# Patient Record
Sex: Female | Born: 1945
Health system: Southern US, Community
[De-identification: ages and names within clinical notes are randomized; demographics above are authoritative.]

## PROBLEM LIST (undated history)

## (undated) DIAGNOSIS — E559 Vitamin D deficiency, unspecified: Secondary | ICD-10-CM

## (undated) DIAGNOSIS — H269 Unspecified cataract: Secondary | ICD-10-CM

## (undated) DIAGNOSIS — K219 Gastro-esophageal reflux disease without esophagitis: Secondary | ICD-10-CM

## (undated) DIAGNOSIS — Z9889 Other specified postprocedural states: Secondary | ICD-10-CM

## (undated) DIAGNOSIS — M3390 Dermatopolymyositis, unspecified, organ involvement unspecified: Secondary | ICD-10-CM

## (undated) DIAGNOSIS — R569 Unspecified convulsions: Secondary | ICD-10-CM

## (undated) DIAGNOSIS — R9401 Abnormal electroencephalogram [EEG]: Secondary | ICD-10-CM

## (undated) DIAGNOSIS — M3313 Other dermatomyositis without myopathy: Secondary | ICD-10-CM

## (undated) DIAGNOSIS — F32A Depression, unspecified: Secondary | ICD-10-CM

## (undated) DIAGNOSIS — N3946 Mixed incontinence: Secondary | ICD-10-CM

## (undated) DIAGNOSIS — I10 Essential (primary) hypertension: Secondary | ICD-10-CM

## (undated) DIAGNOSIS — Z8719 Personal history of other diseases of the digestive system: Secondary | ICD-10-CM

## (undated) DIAGNOSIS — R413 Other amnesia: Secondary | ICD-10-CM

## (undated) DIAGNOSIS — N950 Postmenopausal bleeding: Secondary | ICD-10-CM

## (undated) HISTORY — PX: NO PAST SURGERIES: SHX2092

## (undated) HISTORY — DX: Abnormal electroencephalogram (EEG): R94.01

## (undated) HISTORY — DX: Postmenopausal bleeding: N95.0

## (undated) HISTORY — DX: Dermatopolymyositis, unspecified, organ involvement unspecified: M33.90

## (undated) HISTORY — DX: Gastro-esophageal reflux disease without esophagitis: K21.9

## (undated) HISTORY — DX: Unspecified convulsions: R56.9

## (undated) HISTORY — DX: Other dermatomyositis without myopathy: M33.13

## (undated) HISTORY — DX: Other amnesia: R41.3

## (undated) HISTORY — DX: Essential (primary) hypertension: I10

## (undated) HISTORY — DX: Mixed incontinence: N39.46

## (undated) HISTORY — DX: Depression, unspecified: F32.A

## (undated) HISTORY — DX: Vitamin D deficiency, unspecified: E55.9

## (undated) HISTORY — PX: EYE SURGERY: SHX253

## (undated) HISTORY — DX: Other specified postprocedural states: Z98.890

## (undated) HISTORY — DX: Personal history of other diseases of the digestive system: Z87.19

## (undated) HISTORY — DX: Unspecified cataract: H26.9

---

## 1997-12-18 ENCOUNTER — Ambulatory Visit (HOSPITAL_COMMUNITY): Admission: RE | Admit: 1997-12-18 | Discharge: 1997-12-18 | Payer: Self-pay | Admitting: Gastroenterology

## 1998-04-16 ENCOUNTER — Other Ambulatory Visit: Admission: RE | Admit: 1998-04-16 | Discharge: 1998-04-16 | Payer: Self-pay | Admitting: *Deleted

## 1999-04-18 ENCOUNTER — Other Ambulatory Visit: Admission: RE | Admit: 1999-04-18 | Discharge: 1999-04-18 | Payer: Self-pay | Admitting: *Deleted

## 2000-01-03 ENCOUNTER — Other Ambulatory Visit: Admission: RE | Admit: 2000-01-03 | Discharge: 2000-01-03 | Payer: Self-pay | Admitting: *Deleted

## 2000-01-03 ENCOUNTER — Encounter (INDEPENDENT_AMBULATORY_CARE_PROVIDER_SITE_OTHER): Payer: Self-pay | Admitting: Specialist

## 2000-01-14 ENCOUNTER — Other Ambulatory Visit: Admission: RE | Admit: 2000-01-14 | Discharge: 2000-01-14 | Payer: Self-pay | Admitting: *Deleted

## 2000-01-14 ENCOUNTER — Encounter (INDEPENDENT_AMBULATORY_CARE_PROVIDER_SITE_OTHER): Payer: Self-pay

## 2000-04-22 ENCOUNTER — Other Ambulatory Visit: Admission: RE | Admit: 2000-04-22 | Discharge: 2000-04-22 | Payer: Self-pay | Admitting: *Deleted

## 2000-12-01 ENCOUNTER — Ambulatory Visit (HOSPITAL_COMMUNITY): Admission: RE | Admit: 2000-12-01 | Discharge: 2000-12-01 | Payer: Self-pay | Admitting: Gastroenterology

## 2000-12-04 ENCOUNTER — Encounter: Payer: Self-pay | Admitting: Gastroenterology

## 2000-12-04 ENCOUNTER — Encounter: Admission: RE | Admit: 2000-12-04 | Discharge: 2000-12-04 | Payer: Self-pay | Admitting: Gastroenterology

## 2001-02-27 ENCOUNTER — Encounter: Payer: Self-pay | Admitting: Family Medicine

## 2001-02-27 ENCOUNTER — Encounter: Admission: RE | Admit: 2001-02-27 | Discharge: 2001-02-27 | Payer: Self-pay | Admitting: Family Medicine

## 2001-04-08 ENCOUNTER — Ambulatory Visit (HOSPITAL_BASED_OUTPATIENT_CLINIC_OR_DEPARTMENT_OTHER): Admission: RE | Admit: 2001-04-08 | Discharge: 2001-04-08 | Payer: Self-pay | Admitting: Neurosurgery

## 2001-05-11 ENCOUNTER — Ambulatory Visit (HOSPITAL_BASED_OUTPATIENT_CLINIC_OR_DEPARTMENT_OTHER): Admission: RE | Admit: 2001-05-11 | Discharge: 2001-05-11 | Payer: Self-pay | Admitting: Neurosurgery

## 2001-12-23 ENCOUNTER — Encounter: Payer: Self-pay | Admitting: Family Medicine

## 2001-12-23 ENCOUNTER — Encounter: Admission: RE | Admit: 2001-12-23 | Discharge: 2001-12-23 | Payer: Self-pay | Admitting: Family Medicine

## 2003-08-04 DIAGNOSIS — N950 Postmenopausal bleeding: Secondary | ICD-10-CM

## 2003-08-04 HISTORY — DX: Postmenopausal bleeding: N95.0

## 2003-08-24 HISTORY — PX: ENDOMETRIAL BIOPSY: SHX622

## 2004-04-28 ENCOUNTER — Inpatient Hospital Stay (HOSPITAL_COMMUNITY): Admission: EM | Admit: 2004-04-28 | Discharge: 2004-05-01 | Payer: Self-pay | Admitting: Emergency Medicine

## 2004-04-29 HISTORY — PX: ESOPHAGOGASTRODUODENOSCOPY ENDOSCOPY: SHX5814

## 2004-05-03 ENCOUNTER — Ambulatory Visit (HOSPITAL_COMMUNITY): Admission: RE | Admit: 2004-05-03 | Discharge: 2004-05-03 | Payer: Self-pay | Admitting: Gastroenterology

## 2004-07-23 ENCOUNTER — Other Ambulatory Visit: Admission: RE | Admit: 2004-07-23 | Discharge: 2004-07-23 | Payer: Self-pay | Admitting: Obstetrics and Gynecology

## 2004-09-25 ENCOUNTER — Ambulatory Visit (HOSPITAL_COMMUNITY): Admission: RE | Admit: 2004-09-25 | Discharge: 2004-09-25 | Payer: Self-pay | Admitting: Gastroenterology

## 2004-09-25 ENCOUNTER — Encounter (INDEPENDENT_AMBULATORY_CARE_PROVIDER_SITE_OTHER): Payer: Self-pay | Admitting: *Deleted

## 2004-09-25 HISTORY — PX: COLONOSCOPY: SHX174

## 2004-09-25 LAB — HM COLONOSCOPY: HM Colonoscopy: NORMAL

## 2005-07-24 ENCOUNTER — Other Ambulatory Visit: Admission: RE | Admit: 2005-07-24 | Discharge: 2005-07-24 | Payer: Self-pay | Admitting: Obstetrics & Gynecology

## 2005-07-30 ENCOUNTER — Ambulatory Visit (HOSPITAL_COMMUNITY): Admission: RE | Admit: 2005-07-30 | Discharge: 2005-07-30 | Payer: Self-pay | Admitting: Obstetrics & Gynecology

## 2005-08-08 ENCOUNTER — Encounter: Admission: RE | Admit: 2005-08-08 | Discharge: 2005-08-08 | Payer: Self-pay | Admitting: Obstetrics & Gynecology

## 2006-03-30 ENCOUNTER — Emergency Department (HOSPITAL_COMMUNITY): Admission: EM | Admit: 2006-03-30 | Discharge: 2006-03-31 | Payer: Self-pay | Admitting: Emergency Medicine

## 2006-07-27 ENCOUNTER — Other Ambulatory Visit: Admission: RE | Admit: 2006-07-27 | Discharge: 2006-07-27 | Payer: Self-pay | Admitting: Obstetrics & Gynecology

## 2007-08-02 ENCOUNTER — Other Ambulatory Visit: Admission: RE | Admit: 2007-08-02 | Discharge: 2007-08-02 | Payer: Self-pay | Admitting: Obstetrics and Gynecology

## 2008-05-12 ENCOUNTER — Encounter: Admission: RE | Admit: 2008-05-12 | Discharge: 2008-05-12 | Payer: Self-pay | Admitting: Obstetrics and Gynecology

## 2008-08-14 LAB — HM PAP SMEAR

## 2009-02-03 HISTORY — PX: COLONOSCOPY: SHX174

## 2010-06-21 NOTE — Procedures (Signed)
[place]. [facility]  Patient:    April Chung, April Chung Visit Number: [id number] MRN: [id number]          Service Type: END Location: ENDO Attending Physician:  Orland Mustard Dictated by:   Llana Aliment. Randa Evens, M.D. Proc. Date: 12/01/00 Admit Date:  12/01/2000   CC:         Delorse Lek, M.D.                           Procedure Report  PROCEDURE PERFORMED:  Esophagogastroduodenscopy.  ENDOSCOPIST:  Llana Aliment. Randa Evens, M.D.  MEDICATIONS:  Hurricaine spray, fentanyl 25 mcg, Versed 2.5 mg IV.  INDICATIONS:  Persistent bloating, epigastric discomfort following eating.  DESCRIPTION OF PROCEDURE:  The procedure had been explained to the patient and consent obtained.  With the patient in the left lateral decubitus position, the Olympus video endoscope was inserted and advanced under direct visualization.  The stomach was entered and immediately upon entering the stomach there was a large amount of solid material despite an overnight fast from yesterday afternoon.  ____________ was widely patent and upon entering the duodenum there was solid material in the duodenum as well down in to the second portion.  Not a large amount but much more than normal.  This was documented photographically.  The scope was withdrawn back into the stomach and again the pyloric channel was widely patent.  The antrum and body were normal.  No ulceration or inflammation.  Fundus and cardia seen on the retroflex view.  Distal and proximal esophagus seen well upon removal of the scope and were normal.  ASSESSMENT:  Retained solids despite overnight fast.  This will bring up the possibility of gastroparesis or small bowel obstruction.  PLAN: 1. Will continue on current medications. 2. Will continue her on Protonix. 3. Will arrange a small bowel series as an outpatient. 4. Will see back in the office in two to three weeks.  Further work-up    dictated by the results of the small  bowel series. Dictated by:   Llana Aliment. Randa Evens, M.D. Attending Physician:  Orland Mustard DD:  12/01/00 TD:  12/01/00 Job: [id number]

## 2010-06-21 NOTE — Op Note (Signed)
[place]. [facility]  Patient:    April Chung, April Chung Visit Number: [id number] MRN: [id number]          Service Type: DSU Location: [facility] Attending Physician:  Gerald Dexter Dictated by:   Reinaldo Meeker, M.D. Proc. Date: 04/08/01 Admit Date:  04/08/2001                             Operative Report  PREOPERATIVE DIAGNOSIS:  Right carpal tunnel syndrome.  POSTOPERATIVE DIAGNOSIS:  Right carpal tunnel syndrome.  OPERATION PERFORMED:  Right carpal tunnel release.  SURGEON:  Reinaldo Meeker, M.D.  ANESTHESIA:  DESCRIPTION OF PROCEDURE:  After induction of regional anesthetic in the right upper extremity, the patients wrist and hand were prepped and draped in the usual sterile fashion.  A curvilinear incision was made starting at the wrist in line with the ring finger heading up towards the palm and then swinging slightly radially.  Dissection was carried out down through the subcutaneous layer and two self-retaining retractors were placed for exposure.  A transverse carpal ligament was easily identified.  Starting in proximal to distal direction, this was incised.  The median nerve was identified beneath it under marked compression.  The entire transverse carpal ligament was then incised so that the nerve was well decompressed.  At this point inspection was carried out in all directions for any evidence of residual compression and none could be identified.  Irrigation was carried out and any bleeding controlled with bipolar coagulation.  Interrupted Vicryl was used on the subcutaneous tissue and interrupted nylon on the skin.  Sterile bulky dressing was applied and the patient was taken to the recovery room in stable condition. Dictated by:   Reinaldo Meeker, M.D. Attending Physician:  Gerald Dexter DD:  04/08/01 TD:  04/09/01 Job: [id number]

## 2010-06-21 NOTE — Consult Note (Signed)
[name], [name]                  ACCOUNT NO.:  [id number]   MEDICAL RECORD NO.:  [id number]          PATIENT TYPE:  INP   LOCATION:  0373                         FACILITY:  [facility]   PHYSICIAN:  Anselmo Rod, M.D.  DATE OF BIRTH:  06/04/1945   DATE OF CONSULTATION:  04/28/2004  DATE OF DISCHARGE:                                   CONSULTATION   REASON FOR CONSULTATION:  Rectal bleeding with mild anemia. Hemoglobin of  10.6 gm/dL.   ASSESSMENT:  1.  Bright red blood per rectum since yesterday:  Rule out colonic polyps,      masses, etc.  2.  History of dermatomyositis diagnosed in 1995 under the care of Dr.      Lora Paula in [place].  3.  History of mild osteopenia on Boniva.  4.  Status post left foot bunionectomy four weeks ago by Dr. Ysidro Evert. [place],      doing well.  5.  Status post bilateral cataract surgery in the past.  6.  History of bilateral carpal tunnel release in the past.   RECOMMENDATIONS:  1.  EGD and colonoscopy is planned for tomorrow:  We will prep the patient      tonight and keep her NPO after midnight.  2.  Continuous CBCs.  3.  Hold out aspirin and all nonsteroidals for now.  4.  Further recommendation made after the EGD and colonoscopy has been done.   HISTORY OF PRESENT ILLNESS:  [name] is a 65 year old white female  with the above-medical problems who was doing well until about 8:30 p.m.  last night when she had an urge to have a bowel movement and noticed a small  amount of fresh blood in the stool. Subsequently, she had two small bowel  movements that prompted her to come to the emergency room. She felt slightly  weak, faint, and in the ER had another two bowel movements with blood in the  them. She was rehydrated with IV fluids and admitted for further observation  and treatment. She denies any abdominal pain associated with these symptoms.  There was no nausea, vomiting, fever, chills, or rigors. There was no  history of melena. Appetite is good. Weight has been stable. She has one  bowel movement on a regular basis daily. There is no history of colon  cancer. She denies any cardiorespiratory or genitourinary complaints at this  time. The patient had a colonoscopy several years ago, questionable 1995,  that was normal. She does not remember the name of the physician who did it.  She had a flexible sigmoidoscopy by Dr. Marjory Lies in the last three to  four years at his office, which she claims was normal. I do not have access  to those records at the present time.   PAST MEDICAL HISTORY:  See list above.   ALLERGIES:  No known drug allergies.   MEDICATIONS:  1.  Aspirin 81 mg q.d.  2.  Prempro.  3.  Multivitamins.  4.  Alphagan eye drops.  5.  She has been on  methotrexate and prednisone off and on in the past for      her dermatomyositis but is not taking any immunosuppressants at this      time.   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 She lives with her  husband and [place] in [place]. She denies use of tobacco or  illicit drugs. She drinks two glasses a wine per night.   FAMILY HISTORY:  Her mother has hypertension but is otherwise healthy. Her  father died of complications of Alzheimer's in his 30s. She has no siblings.  There is no known family history of breast, ovarian, uterine, or colon  cancer.   REVIEW OF SYMPTOMS:  Rectal bleeding since yesterday. No history of abnormal  weight loss, nausea, vomiting, or melena.   PHYSICAL EXAMINATION:  GENERAL:  A pleasant, cooperative, elderly white  female in no acute distress.  VITAL SIGNS:  Stable. Temperature is 97.4, blood pressure 149/75, pulse 57  per minute, respiratory rate 20.  HEENT:  PERRLA. Oropharynx mucosa without exudate.  NECK:  Supple. No JVD, thyromegaly, or lymphadenopathy.  CHEST:  Clear to auscultation. S1 and S2 regular.  ABDOMEN:  Soft, nontender, and nondistended with normal bowel  sounds. No  surgical scars are appreciated. There is no evidence of hepatosplenomegaly.  RECTAL:  Deferred as the patient does not want to have another one. She had  one done by Dr. Gerarda Fraction. [place] in the ER which showed fresh blood on the  examining finger. Apparently, no masses were palpable.   LABORATORY DATA:  Hemoglobin of 11.1 on admission, down to 10.8 yesterday,  and 10.6 today. MCV is 92 with platelet count of 270,000. PT was 13, INR of  1. Sodium of 141, potassium 3.6, chloride 108, CO2 27, glucose  94. BUN 18, creatinine 0.6, total bilirubin 0.7, alkaline phosphate 51, AST  27, ALT 16, total protein 6.2, albumin 3, calcium 8.6.   PLAN:  As above. Further recommendation will be made in follow-up.      JNM/MEDQ  D:  04/28/2004  T:  04/29/2004  Job:  [id number]   cc:   [facility]   Laqueta Linden, M.D.  [address]  [place]  [place] [address]  Fax: [phone]   Alexis Goodell, M.D.  [place], [place]   Marjory Lies, M.D.  [address]  [place]  [place] [address]  Fax: [phone]

## 2010-06-21 NOTE — Op Note (Signed)
[place]. [facility]  Patient:    April Chung, April Chung Visit Number: [id number] MRN: [id number]          Service Type: DSU Location: [facility] Attending Physician:  Gerald Dexter Dictated by:   Reinaldo Meeker, M.D. Proc. Date: 05/11/01 Admit Date:  04/08/2001 Discharge Date: 04/08/2001                             Operative Report  PREOPERATIVE DIAGNOSIS:  Left carpal tunnel syndrome.  POSTOPERATIVE DIAGNOSIS:  Left carpal tunnel syndrome.  OPERATION PERFORMED:  Left carpal tunnel release.  SURGEON:  Reinaldo Meeker, M.D.  ANESTHESIA:  DESCRIPTION OF PROCEDURE:  After the induction of regional anesthetic, the patients forearm, wrist and hand were prepped and draped in the usual sterile fashion.  A small curvilinear incision was made starting at the wrist in line with the ring finger heading superiorly into the wrist and then slightly in a radial direction.  Subcutaneous tissues were dissected free until the transverse carpal ligament could be identified and then two small spring loaded self-retaining retractors were placed for exposure.  Starting in a proximal to distal direction, the transverse carpal ligament was incised until the median nerve was identified beneath it.  A very thorough transection of the ligament was carried out until the nerve was completely freed and into the proximal wrist and up into the midhand area by undermining the skin incision. At this point inspection was carried out in all directions for any evidence of residual compression and none could be identified.  Irrigation was carried out and any bleeding controlled with bipolar coagulation.  The wound was then closed using interrupted 0 Vicryl on the subcutaneous tissue and interrupted nylon on the skin.  A large bulky dressing was applied and the patient was taken to the recovery room in stable condition. Dictated by:   Reinaldo Meeker, M.D. Attending Physician:  Gerald Dexter DD:  05/11/01 TD:  05/11/01 Job: [id number]

## 2010-06-21 NOTE — Discharge Summary (Signed)
April Chung, April Chung                  ACCOUNT NO.:  [id number]   MEDICAL RECORD NO.:  [id number]          PATIENT TYPE:  INP   LOCATION:  0373                         FACILITY:  [facility]   PHYSICIAN:  Michaelyn Barter, M.D. DATE OF BIRTH:  [birth year]   DATE OF ADMISSION:  04/27/2004  DATE OF DISCHARGE:  05/01/2004                                 DISCHARGE SUMMARY   FINAL DISCHARGE DIAGNOSES:  1.  Gastrointestinal bleed.  2.  Anemia.  3.  Hypokalemia.  4.  Hypophosphatemia.  5.  Hypocalcemia.   CONSULTATIONS:  Gastroenterology with Dr. Charna Elizabeth and Dr. Elnoria Howard.   PROCEDURES:  1.  EGD.  2.  Capsule endoscopy.  3.  The patient was transfused 2 units of packed RBCs.  4.  Screening colonoscopy.   PRIMARY CARE PHYSICIAN:  Dr. Benedetto Goad of [facility].   HISTORY OF PRESENT ILLNESS:  April Chung is a 65 year old female who arrived  at the hospital with a chief complaint of bright red blood per rectum.  She  states that she had experienced two episodes.  She also complained of some  mild epigastric pain following dinner.  After dinner, she had the urge to go  to the bathroom and while there, she passed bright red blood per her rectum,  which included some clots.  She denied having any serious abdominal pain.  No fever, nausea, or emesis.  She called her primary care physician, Dr.  Andrey Campanile, and was told to come to the emergency room for further evaluation.   PAST MEDICAL HISTORY:  1.  Dermatomyositis.  2.  Questionable GERD.   ALLERGIES:  No known drug allergies.   SOCIAL HISTORY:  Alcohol:  Patient denies.  Cigarettes:  Patient denies.   FAMILY HISTORY:  The patient denied having any family history of colorectal  cancer.   HOSPITAL COURSE:  Problem 1:  GI bleed:  The patient had a CBC completed in  the emergency room.  Her hemoglobin was 11.1.  Her MCV was 91.8, and her  platelet count was 290.  She was admitted to the hospital for further  evaluation.  Her INR was  found to be 1, and her PT was found to be 13.  Following her admission, gastroenterology was consulted.  Dr. Charna Elizabeth  was the physician who responded to the consult.  Dr. Loreta Ave decided to perform  a EGD on the patient on April 29, 2004.  Her final impression was that the  patient had normal esophagogastroduodenoscopy except for a small hiatal  hernia.  She decided to proceed with a capsule endoscopy and also to  transfuse 2 units of packed RBCs to the patient.  In addition, the patient  also had a screening colonoscopy completed on that same day by Dr. Loreta Ave.  Her final results was that there was a large amount of residual stool in the  colon, small lesions could have been missed.  Over the course of her  hospitalization, she never complained of any abdominal pain, and likewise,  the complaints of blood in her stools decreased.  On May 01, 2004, the  patient had a capsule endoscopy completed.  She will follow up with Dr.  Charna Elizabeth for those results.  Problem 2:  Anemia:  Again, when the patient came into the hospital, her  hemoglobin was noted to have been 11.1; however, by the following day, her  hemoglobin dropped to 8.8 and then finally to 8.5.  The decision was made to  transfuse the patient 2 units of packed RBCs.  Problem 3:  Chest discomfort:  At the time of admission, the patient had  some chest discomfort which she described as being mid sternal in its origin  over her sternal area.  It appeared to be musculoskeletal in component;  however, cardiac enzymes were ordered, and the patient had two sets of  troponin I completed, both of which were 0.02.  She also had an EKG  completed which revealed sinus bradycardia but otherwise, there were no  significant ST changes.  Over the course of her hospitalization, she stated  that her chest pain had resolved.  Problem 4:  History of dermatomyositis:  The patient did not have any  symptoms related to this over the course of her  hospitalization.  The  decision was made to just follow this conservatively.  Problem 5:  Hypokalemia:  The patient was noted on March 29th to have had a  potassium of 2.9.  This was repleted with K-Dur.  Problem 6:  Hypophosphatemia:  On the day of discharge, the patient was  noted to have had a phosphate of 1.7.  This was repleted with Neutra-Phos.  Problem 7:  The patient was noted on the day of discharge to have  hypocalcemia with a calcium of 7.3.  She received 500 mg of calcium  carbonate.   CONDITION ON DISCHARGE:  Improved.  On the date of discharge, the patient  stated that she had no nausea, vomiting, fever, or chills.  She had no  abdominal complaints.  She stated that she really wanted to go home.  Her  vitals at the time of discharge, her temperature was 97.8, heart rate 57,  respirations 18, blood pressure 144/79, and her O2 sat was 99% on room air.  The patient's hemoglobin on the date of discharge was 11.6.  Her hematocrit  was 32.8.  Her white blood cell count was 5.5 with a platelet count of 234.  The decision was made to discharge the patient home.   The patient was discharged home on the following medications:  Protonix 40  mg p.o. daily, Phenergan 12.5 mg p.o. q.8h. p.r.n.   Patient was instructed to follow up with Dr. Loreta Ave or Dr. Elnoria Howard for the  results of her endoscopic study.  She was also instructed to take her  medications as prescribed and to follow up with her primary care physician,  Dr. Benedetto Goad, within the next 30 days.     OR/MEDQ  D:  05/01/2004  T:  05/01/2004  Job:  [id number]   cc:   Gloriajean Dell. Andrey Campanile, M.D.  [address]  [place]  [place] [address]  Fax: [phone]

## 2010-06-21 NOTE — Op Note (Signed)
[name], April Chung                  ACCOUNT NO.:  [id number]   MEDICAL RECORD NO.:  [id number]          PATIENT TYPE:  INP   LOCATION:  0373                         FACILITY:  [facility]   PHYSICIAN:  Anselmo Rod, M.D.  DATE OF BIRTH:  07/06/1945   DATE OF PROCEDURE:  04/29/2004  DATE OF DISCHARGE:                                 OPERATIVE REPORT   PROCEDURE PERFORMED:  Esophagogastroduodenoscopy.   ENDOSCOPIST:  Anselmo Rod, M.D.   INSTRUMENT USED:  Olympus video pan endoscope.   INDICATIONS FOR PROCEDURE:  A 65 year old white female with a history of  dermatomycetes undergoing an EGD for rectal bleeding and unrevealing to work  up peptic ulcer disease and gastric masses, etc.   PRE-PROCEDURE PREPARATION:  Informed consent was procured from the patient.  The patient fasted for eight hours prior to the procedure.   PRE-PROCEDURE PHYSICAL:  VITAL SIGNS:  The patient had stable vital signs.  NECK:  Supple.  LUNGS:  Clear to auscultation.  HEART:  Regular.  ABDOMEN:  Soft.  Normal bowel sounds.   DESCRIPTION OF PROCEDURE:  The patient was placed in the left lateral  decubitus position.  No additional sedation was used for the EGD.  Once the  patient was adequately sedated and maintained on low-flow oxygen and  continuous cardiac monitoring, the Olympus video pan endoscope was advanced  through the mouth piece over the tongue into the esophagus.  Under direct  vision, the entire esophagus appeared normal with no evidence of ring,  strictures, masses, esophagitis, varices.  The scope was advanced into the  stomach.  The entire gastric mucosa appeared somewhat thin but no ulcers,  erosions, masses, or polyps were seen.  A small hiatal hernia was noted on  high retroflexion.  The rest of the gastric mucosa and the proximal small  bowel appeared normal.  There was no other obstruction.   IMPRESSION:  Normal esophagogastroduodenoscopy except for a small hiatal  hernia.   RECOMMENDATIONS:  1.  Proceed with a capsule endoscopy at the earliest.  2.  Transfuse 2 units of blood today.  3.  Do serial CBCs.  4.  Further recommendations will be made after the capsule study has been      done.      JNM/MEDQ  D:  04/30/2004  T:  04/30/2004  Job:  [id number]   cc:   Marjory Lies, M.D.  [address]  [place]  [place] [address]  Fax: [phone]   Laqueta Linden, M.D.  [address]  [place]  [place] [address]  Fax: [phone]   Dr. Wynelle Cleveland  [place], [place]

## 2010-06-21 NOTE — Op Note (Signed)
April Chung, April Chung                  ACCOUNT NO.:  [id number]   MEDICAL RECORD NO.:  [id number]          PATIENT TYPE:  INP   LOCATION:  0373                         FACILITY:  [facility]   PHYSICIAN:  Anselmo Rod, M.D.  DATE OF BIRTH:  [birth year]   DATE OF PROCEDURE:  04/29/2004  DATE OF DISCHARGE:                                 OPERATIVE REPORT   PROCEDURE PERFORMED:  Screening colonoscopy, endoscopy.   INSTRUMENT USED:  Olympus video colonoscope.   INDICATIONS FOR PROCEDURE:  A 66 year old white female with a history of  dermatomycetes, followed by Dr. Alexis Goodell in [place].  Undergoing screening colonoscopy for rectal bleeding and anemia, rule out  colon polyps, masses, etc.   PRE-PROCEDURE PREPARATION:  Informed consent was procured from the patient.  The patient fasted for eight hours prior to the procedure and prepped with a  bottle of magnesium citrate and a gallon of GoLYTELY the night prior to the  procedure.  Risks and benefits of the procedure, including a 10% miss rate  of cancer and polyps were discussed with the patient as well.   PRE-PROCEDURE PHYSICAL:  VITAL SIGNS:  The patient had stable vital signs.  NECK:  Supple.  CHEST:  Clear to auscultation.  HEART:  Regular.  ABDOMEN:  Soft with normal bowel sounds.   DESCRIPTION OF PROCEDURE:  The patient was placed in the left lateral  decubitus position and treated with 50 mg of Demerol and 5 mg of Versed in  slow incremental doses.  The patient had somewhat low blood pressure at  98/60.  Once the patient was adequately sedated and maintained on low flow  oxygen with continuous cardiac monitoring, the Olympus video colonoscope was  advanced from the rectum to the cecum.  There was a large amount of residual  stool in the colon.  Multiple washings were done.  No masses, polyps,  erosions, ulcerations or diverticula were seen.  Retroflexion from the  rectum revealed no abnormalities.   IMPRESSION:  1.   Unrevealing colonoscopy.  2.  Large amount of residual stool in the colon, small lesions could be      missed.   RECOMMENDATIONS:  1.  Proceed with an EGD at this time.  2.  Transfuse 2 units of blood after EGD is done.  3.  Serial CBCs.  4.  Further recommendations will be made after EGD has been completed.      JNM/MEDQ  D:  04/30/2004  T:  04/30/2004  Job:  [id number]   cc:   Gloriajean Dell. Andrey Campanile, M.D.  [address]  [place]  [place] [address]  Fax: [phone]   Dr. Sabino Dick, [place]   Laqueta Linden, M.D.  [address]  [place]  [place] [address]  Fax: [phone]

## 2010-06-21 NOTE — Op Note (Signed)
NAMEJAISA, DEFINO                  ACCOUNT NO.:  [id number]   MEDICAL RECORD NO.:  [id number]          PATIENT TYPE:  AMB   LOCATION:  ENDO                         FACILITY:  [facility]   PHYSICIAN:  Anselmo Rod, M.D.  DATE OF BIRTH:  28-Feb-1945   DATE OF PROCEDURE:  09/25/2004  DATE OF DISCHARGE:                                 OPERATIVE REPORT   PROCEDURE:  Screening colonoscopy.   ENDOSCOPIST:  Anselmo Rod, M.D.   INSTRUMENT USED:  Olympus video colonoscope.   INDICATIONS FOR PROCEDURE:  This 65 year old white female with a personal  history of dermatomyositis undergoing a repeat colonoscopy, as she had a  history of rectal bleeding requiring hospitalization.  She had a colonoscopy  done at that time revealing a large amount of stool in the colon.  Therefore  a repeat colonoscopy is being done after re-prepping the patient.  She has  not had any rectal bleeding in the recent past.   PRE-PROCEDURE PREPARATION:  An informed consent was procured from the  patient and the patient was fasted for eight hours prior to the procedure  and prepped with a bottle of a magnesium citrate and one gallon of GoLYTELY  on the night prior to the procedure.  The risks and benefits of the  procedure, including a 10% mis-rate of cancer and polyp were discussed with  her as well.   PRE-PROCEDURE PHYSICAL EXAMINATION:  VITAL SIGNS:  Stable.  NECK:  Supple.  CHEST:  Clear to auscultation.  HEART: S1, S2, regular.  ABDOMEN:  Soft, with normal bowel sounds.   DESCRIPTION OF PROCEDURE:  The patient was placed in the left lateral  decubitus position and sedated with 50 mg of Demerol and 6.5 mg of Versed in  slow incremental doses.  Once the patient was adequately sedated and  maintained on low-flow oxygen and continuous cardiac monitoring, the Olympus  video colonoscope was advanced from the rectum to the cecum.  The  appendicular orifice and the ileocecal valve were clearly visualized and  photographed.  The terminal ileum appeared healthy without lesions. A small  patch of erythema was biopsied from the proximal right colon (cold biopsy  x1), bled easily and 7.5 mL of epinephrine were injected to control the  bleeding.  Retroflexion in the rectum revealed no abnormalities.  The rest  of the examination was unremarkable.  No masses, polyps, erosions,  ulcerations or diverticula were seen. The patient tolerated the procedure  well without immediate complications.   IMPRESSION:  Normal colonoscopy to the terminal ileum, except for a small  patch of erythema, biopsied x1, from the proximal right colon, with 7.5 mL  of epinephrine injected to control the bleeding.   RECOMMENDATIONS:  1.  Await the pathology results.  2.  Avoid non-steroidals for the next four weeks.  3.  Repeat colonoscopy, depending upon the pathology results.  4.  Outpatient followup as the need arises in the future.      Anselmo Rod, M.D.  Electronically Signed     JNM/MEDQ  D:  09/25/2004  T:  09/25/2004  Job:  [id number]   cc:   Marjory Lies, M.D.  [address]  [place]  [place] [address]  Fax: [phone]   Laqueta Linden, M.D.  [address]  [place]  [place] [address]  Fax: [phone]

## 2010-06-21 NOTE — H&P (Signed)
April Chung, April Chung                  ACCOUNT NO.:  [id number]   MEDICAL RECORD NO.:  [id number]          PATIENT TYPE:  INP   LOCATION:  0368                         FACILITY:  [facility]   PHYSICIAN:  Lonia Blood, M.D.      DATE OF BIRTH:  [birth year]   DATE OF ADMISSION:  04/27/2004  DATE OF DISCHARGE:                                HISTORY & PHYSICAL   PRIMARY CARE PHYSICIAN:  [facility], Dr. Benedetto Goad.   PRESENTING COMPLAINT:  Bright red blood per rectum.   HISTORY OF PRESENT ILLNESS:  This is a 65 year old white female with no  significant past medical history, who presents with two episodes of bright  red blood per rectum this evening.  The patient describes her symptoms  starting with some mild epigastric pain followed by dinner.  Right after  dinner, she felt like going to the bathroom.  When in there, had bright red  blood per rectum with some clots.  The patient describes it as minimally  blood with no stool with associated clots as well.  Denied any serious  abdominal pain, fever, nausea or vomiting.  The patient was worried that she  called Dr. Andrey Campanile who asked her to come to the emergency room.  No prior  episode of GI bleed.  No prior colonoscopy or EGD.   PAST MEDICAL HISTORY:  1.  Mainly dermatomyositis that is now under admission.  2.  Possible GERD.  3.  Post menopausal status.   MEDICATIONS:  1.  Aspirin 81 mg daily.  2.  Prempro.  3.  Multivitamins.   ALLERGIES:  NO KNOWN DRUG ALLERGIES.   SOCIAL HISTORY:  The patient lives in [place] with her husband.  Denied  any tobacco or alcohol use.  She has been fairly active and has been eating  healthy.   FAMILY HISTORY:  No family history of colorectal cancer.  Denied any family  history of hypertension or diabetes.   REVIEW OF SYSTEMS:  GENERAL:  The patient denies any recent weight gain or  weight loss.  RESPIRATORY:  Denied any shortness of breath, chest pain,  cough.  CARDIOVASCULAR:   Denied any recent chest pain, orthopnea, PND, pedal  swelling.  Denied any exertional dyspnea as well.  ABDOMEN:  As in HPI.  GU:  Denied any GU symptoms.  The patient is postmenopausal.  EXTREMITIES:  Denies any muscular aches, pains or joint swelling.   PHYSICAL EXAMINATION:  Temperature 96.6, blood pressure initially 160/71  with a pulse of 70 while sitting; 102/61 with a pulse of 67 while lying;  96/55 with a pulse of 90 while standing, respiratory rate 16, saturations  99% on room air.  GENERAL:  The patient is alert and oriented, no acute distress.  Giving good  history.  HEENT:  PERRL, EOMI.  NECK:  Supple.  No  JVD.  No lymphadenopathy.  CARDIOVASCULAR:  Regular rate and rhythm.  No murmurs.  ABDOMEN:  Soft, nontender with positive bowel sounds.  EXTREMITIES:  No edema, cyanosis or clubbing.   LABS:  White count of 6.3, hemoglobin  11.1 with an MCV of 91.8, platelet  count 290 with normal differentials.  Sodium 141, potassium 3.6, chloride  108, CO2 27, glucose 94, BUN 18, creatinine 0.8.  Calcium 8.6.  Total  protein 6.2, albumin 6.0, AST 27, ALT 16, and alkaline phosphatase 51.  Total bilirubin 0.1.  Coags are normal with INR of 1.0 and PT of 13.0.   ASSESSMENT:  This is a 65 year old with sudden onset of bright red blood per  rectum.  The patient with no other abdominal symptoms and no other known  medical problems with normal coags.  Chances are her blood is lower GI bleed  rather than upper GI bleed and that numerous differentials include  diverticular disease.  It could also be some internal hemorrhoids.  It could  also be something more sinister like tumors, could be polyps.  The sudden  nature of the patient's symptoms indicate more likely diverticular bleed.  In the meantime, however, will admit the patient, start two wide bore IVs.  As patient was also orthostatic, will start IV fluids right away with normal  saline.  Type and crossmatch for at least two units of  paced red blood  cells.  Start some IV Protonix.  Will also consult GI.  Possible colonoscopy  in the morning to further characterize her GI bleed.  In the meantime, we  will continue with serial H&H and if her hemoglobin drops below 8, we will  go ahead and transfuse her.  The patient and husband are aware of current  plans.      LG/MEDQ  D:  04/28/2004  T:  04/28/2004  Job:  [id number]

## 2011-02-04 DIAGNOSIS — I10 Essential (primary) hypertension: Secondary | ICD-10-CM

## 2011-02-04 HISTORY — DX: Essential (primary) hypertension: I10

## 2011-04-17 LAB — HEPATIC FUNCTION PANEL
AST: 25 U/L (ref 13–35)
Alkaline Phosphatase: 54 U/L (ref 25–125)
Bilirubin, Total: 7.3 mg/dL

## 2011-04-17 LAB — BASIC METABOLIC PANEL: Sodium: 136 mmol/L — AB (ref 137–147)

## 2011-04-17 LAB — TSH: TSH: 1.83 u[IU]/mL (ref 0.41–5.90)

## 2011-04-17 LAB — CBC AND DIFFERENTIAL
HCT: 42 % (ref 36–46)
Platelets: 288 10*3/uL (ref 150–399)
WBC: 7.3 10^3/mL

## 2011-05-28 ENCOUNTER — Ambulatory Visit (INDEPENDENT_AMBULATORY_CARE_PROVIDER_SITE_OTHER): Payer: Medicare Other | Admitting: Internal Medicine

## 2011-05-28 ENCOUNTER — Encounter: Payer: Self-pay | Admitting: Internal Medicine

## 2011-05-28 ENCOUNTER — Other Ambulatory Visit (INDEPENDENT_AMBULATORY_CARE_PROVIDER_SITE_OTHER): Payer: Medicare Other

## 2011-05-28 VITALS — BP 122/80 | HR 62 | Temp 97.2°F | Resp 14 | Ht 61.0 in | Wt 103.5 lb

## 2011-05-28 DIAGNOSIS — R195 Other fecal abnormalities: Secondary | ICD-10-CM

## 2011-05-28 DIAGNOSIS — R6889 Other general symptoms and signs: Secondary | ICD-10-CM

## 2011-05-28 DIAGNOSIS — K219 Gastro-esophageal reflux disease without esophagitis: Secondary | ICD-10-CM

## 2011-05-28 DIAGNOSIS — M3390 Dermatopolymyositis, unspecified, organ involvement unspecified: Secondary | ICD-10-CM | POA: Insufficient documentation

## 2011-05-28 DIAGNOSIS — H409 Unspecified glaucoma: Secondary | ICD-10-CM | POA: Insufficient documentation

## 2011-05-28 LAB — BASIC METABOLIC PANEL
BUN: 14 mg/dL (ref 6–23)
CO2: 27 mEq/L (ref 19–32)
Chloride: 103 mEq/L (ref 96–112)
Creatinine, Ser: 0.6 mg/dL (ref 0.4–1.2)
Potassium: 5 mEq/L (ref 3.5–5.1)

## 2011-05-28 LAB — FERRITIN: Ferritin: 57.3 ng/mL (ref 10.0–291.0)

## 2011-05-28 LAB — CBC WITH DIFFERENTIAL/PLATELET
Basophils Absolute: 0 10*3/uL (ref 0.0–0.1)
Eosinophils Absolute: 0 10*3/uL (ref 0.0–0.7)
Lymphocytes Relative: 18.8 % (ref 12.0–46.0)
MCHC: 34.2 g/dL (ref 30.0–36.0)
Monocytes Relative: 6.3 % (ref 3.0–12.0)
Neutro Abs: 6.4 10*3/uL (ref 1.4–7.7)
Neutrophils Relative %: 74.1 % (ref 43.0–77.0)
Platelets: 279 10*3/uL (ref 150.0–400.0)
RDW: 12.3 % (ref 11.5–14.6)

## 2011-05-28 LAB — HEPATIC FUNCTION PANEL
Alkaline Phosphatase: 53 U/L (ref 39–117)
Bilirubin, Direct: 0.1 mg/dL (ref 0.0–0.3)
Total Protein: 7.7 g/dL (ref 6.0–8.3)

## 2011-05-28 LAB — TSH: TSH: 1.68 u[IU]/mL (ref 0.35–5.50)

## 2011-05-28 MED ORDER — FAMOTIDINE 20 MG PO TABS: 20.0000 mg | ORAL_TABLET | Freq: Two times a day (BID) | ORAL | Status: DC | PRN

## 2011-05-28 NOTE — Progress Notes (Signed)
Subjective:    Patient ID: April Chung, female    DOB: Jun 18, 1945, 66 y.o.   MRN: [id number]  HPI  New patient to me and our practice, here today to establish care  Complains of "mushy stools" Onset gradual, 3-6 months ago Reports soft stool once daily each a.m., denies multiple bowel movements during the day Denies specific diarrhea or liquid bowel movements.  Also denies constipation, melena, hematochezia or unexpected weight loss Not associated with abdominal pain Denies change in diet or medications, no travel Multiple repeated colonoscopies over the past several years, last 2012 unremarkable colitis or problems per patient report  ?associated with  "feeling cold all the time" Onset of symptoms 3-6 months ago  Also reviewed chronic medical issues today: GERD. Mild symptoms, present less than 2 times per week. Begun omeprazole daily for same 3 months ago. Symptoms have improved  Glaucoma - follows with optho for same - no change in vision or eye pain - the patient reports compliance with medication(s) as prescribed. Denies adverse side effects.  Hx dermatomyositis - previously followed with rheumatology at [facility] for same. Remotely on prednisone to control symptoms but no steroids since 2008. Reports skin much more affected than muscle. No evidence of recurrence  Past Medical History  Diagnosis Date  . Dermatomyositis     remission on pred until 2008, rheum at [facility]  . Glaucoma   . GERD (gastroesophageal reflux disease)    Family History  Problem Relation Age of Onset  . Dementia Father     died age 31, otherwise healthy   History  Substance Use Topics  . Smoking status: Former Smoker    Quit date: 02/04/1976  . Smokeless tobacco: Not on file  . Alcohol Use: Not on file    Review of Systems Constitutional: Negative for fever or weight change.  Respiratory: Negative for cough and shortness of breath.   Cardiovascular: Negative for chest pain or  palpitations.  Gastrointestinal: Negative for abdominal pain, no bowel changes.  Musculoskeletal: Negative for gait problem or joint swelling.  Skin: Negative for rash.  Neurological: Negative for dizziness or headache.  No other specific complaints in a complete review of systems (except as listed in HPI above).     Objective:   Physical Exam BP 122/80  Pulse 62  Temp(Src) 97.2 F (36.2 C) (Oral)  Resp 14  Ht 5\' 1"  (1.549 m)  Wt 103 lb 8 oz (46.947 kg)  BMI 19.56 kg/m2  SpO2 98% Wt Readings from Last 3 Encounters:  05/28/11 103 lb 8 oz (46.947 kg)   Constitutional: She is thin/fit. appears well-developed and well-nourished. No distress.  HENT: Head: Normocephalic and atraumatic. Ears: B TMs ok, no erythema or effusion; Nose: Nose normal. Mouth/Throat: Oropharynx is clear and moist. No oropharyngeal exudate.  Eyes: Conjunctivae and EOM are normal. Pupils are equal, round, and reactive to light. No scleral icterus.  Neck: Normal range of motion. Neck supple. No JVD present. No thyromegaly present.  Cardiovascular: Normal rate, regular rhythm and normal heart sounds.  No murmur heard. No BLE edema. Pulmonary/Chest: Effort normal and breath sounds normal. No respiratory distress. She has no wheezes.  Abdominal: Soft. Bowel sounds are normal. She exhibits no distension. There is no tenderness. no masses Musculoskeletal: Normal range of motion, no joint effusions. No gross deformities Neurological: She is alert and oriented to person, place, and time. No cranial nerve deficit. Coordination normal.  Skin: Skin is warm and dry. No rash noted. No erythema.  Psychiatric: She has a normal mood and affect. Her behavior is normal. Judgment and thought content normal.   Lab Results  Component Value Date   WBC 8.6 05/28/2011   HGB 15.2* 05/28/2011   HCT 44.4 05/28/2011   PLT 279.0 05/28/2011   GLUCOSE 91 05/28/2011   ALT 23 05/28/2011   AST 33 05/28/2011   NA 139 05/28/2011   K 5.0 05/28/2011    CL 103 05/28/2011   CREATININE 0.6 05/28/2011   BUN 14 05/28/2011   CO2 27 05/28/2011   TSH 1.68 05/28/2011       Assessment & Plan:  See problem list. Medications and labs reviewed today.  Cold intolerance - check screening labs and send for ROI -  Change in bowel - ?related to daily PPI use - as GERD mild, will stop PPI - recommended H2B as needed - send for prior colonoscopy reports and labs at PCP  Time spent with pt today 45 minutes, greater than 50% time spent counseling patient on cold intolerance and bowel changes, GERD and medication review. Also need to review of prior records - ROI from prior PCP and GI

## 2011-05-28 NOTE — Assessment & Plan Note (Signed)
Reports skin>muscle - in remission without recurrent flares since 2008 Off pred since 2008 Send for PCP records - no treatment changes recommended at this time

## 2011-05-28 NOTE — Assessment & Plan Note (Signed)
Mild symptoms, <2/week prior to daily PPI ?PPI causing BM changes - Stop PPI as above Use H2B prn reflux - Send for GI records follow up 3 mo, sooner if worse

## 2011-05-28 NOTE — Patient Instructions (Signed)
It was good to see you today. We have reviewed your prior records including labs and tests today we will send to your prior provider(s) for "release of records" as discussed today -  Test(s) ordered today. Your results will be called to you after review (48-72hours after test completion). If any changes need to be made, you will be notified at that time. Stop omeprazole - use "Pepcid" as needed/if needed for reflux symptoms  Your prescription(s) have been submitted to your pharmacy. Please take as directed and contact our office if you believe you are having problem(s) with the medication(s). Please schedule followup in 3 months for continued review and recheck on symptoms, call sooner if problems.

## 2011-06-02 ENCOUNTER — Encounter: Payer: Self-pay | Admitting: Internal Medicine

## 2011-07-01 DIAGNOSIS — N3946 Mixed incontinence: Secondary | ICD-10-CM

## 2011-07-01 HISTORY — DX: Mixed incontinence: N39.46

## 2011-07-09 ENCOUNTER — Encounter: Payer: Self-pay | Admitting: Internal Medicine

## 2011-07-22 ENCOUNTER — Telehealth: Payer: Self-pay | Admitting: Internal Medicine

## 2011-07-22 DIAGNOSIS — R21 Rash and other nonspecific skin eruption: Secondary | ICD-10-CM

## 2011-07-22 NOTE — Telephone Encounter (Signed)
Notified pt with md response.Marland KitchenMarland KitchenMarland Kitchen6/18/13@1 :53pm/LMB

## 2011-07-22 NOTE — Telephone Encounter (Signed)
ok 

## 2011-07-22 NOTE — Telephone Encounter (Signed)
The pt called and is hoping to get a referral to a dermatologist due to a rash on her leg.  Thanks!

## 2011-08-25 ENCOUNTER — Encounter: Payer: Self-pay | Admitting: Internal Medicine

## 2011-08-26 ENCOUNTER — Ambulatory Visit (INDEPENDENT_AMBULATORY_CARE_PROVIDER_SITE_OTHER): Payer: Medicare Other | Admitting: Internal Medicine

## 2011-08-26 ENCOUNTER — Encounter: Payer: Self-pay | Admitting: Internal Medicine

## 2011-08-26 VITALS — BP 132/84 | HR 61 | Temp 97.6°F | Ht 61.0 in | Wt 98.8 lb

## 2011-08-26 DIAGNOSIS — R194 Change in bowel habit: Secondary | ICD-10-CM | POA: Insufficient documentation

## 2011-08-26 DIAGNOSIS — R634 Abnormal weight loss: Secondary | ICD-10-CM

## 2011-08-26 DIAGNOSIS — R21 Rash and other nonspecific skin eruption: Secondary | ICD-10-CM

## 2011-08-26 DIAGNOSIS — R195 Other fecal abnormalities: Secondary | ICD-10-CM

## 2011-08-26 DIAGNOSIS — K219 Gastro-esophageal reflux disease without esophagitis: Secondary | ICD-10-CM

## 2011-08-26 NOTE — Assessment & Plan Note (Signed)
Mild symptoms, <2/week prior to daily PPI ?PPI causing BM changes - Did not stop PPI as recommended 05/2011 Again, stop PPI and use H2B prn reflux - Refer for new GI eval given change in bowels, weight loss and hx rectal bleeding (none in >82mo - ?IBS)

## 2011-08-26 NOTE — Patient Instructions (Signed)
It was good to see you today. Stop omeprazole - use "Pepcid" 2x/day as needed/if needed for reflux symptoms  we'll make referral to [facility] for evaluation of your symptoms. Our office will contact you regarding appointment(s) once made. Please schedule followup in 6 months for continued review and recheck on symptoms, call sooner if problems.

## 2011-08-26 NOTE — Progress Notes (Signed)
  Subjective:    Patient ID: April Chung, female    DOB: [birth year], 66 y.o.   MRN: [id number]  HPI  Here follow up - reviewed chronic medical issues today: Continued "mushy stools" Onset gradual, >6 months ago Reports soft stool once ortwice daily each a.m. denies >2 bowel movements/ day Denies specific diarrhea or liquid bowel movements.  Also denies constipation, melena, hematochezia or unexpected weight change (but note >4# wt loss in past 3 mo) Not associated with abdominal pain Denies change in diet or medications, no travel Multiple colonoscopies over the past several years with Dr Loreta Ave: unremarkable for colitis or problems per patient report  GERD. Mild symptoms, present less than 2 times per week. Begun omeprazole daily for same 02/2011 and did not try H2B as suggested 05/2011  Glaucoma - follows with optho for same - no change in vision or eye pain - the patient reports compliance with medication(s) as prescribed. Denies adverse side effects.  Hx dermatomyositis - previously followed with rheumatology at [facility] for same. Remotely on prednisone to control symptoms but no steroids since 2008. Reports skin much more affected than muscle. No evidence of recurrence  Past Medical History  Diagnosis Date  . Dermatomyositis     remission on pred until 2008, rheum at [facility]  . Glaucoma   . GERD (gastroesophageal reflux disease)     Review of Systems  Constitutional: Negative for fever or weight change.  Respiratory: Negative for cough and shortness of breath.  Skin: mild red and itch spots on BLE x 1 mo .      Objective:   Physical Exam  BP 152/82  Pulse 61  Temp 97.6 F (36.4 C) (Oral)  Ht 5\' 1"  (1.549 m)  Wt 98 lb 12.8 oz (44.815 kg)  BMI 18.67 kg/m2  SpO2 91% Wt Readings from Last 3 Encounters:  08/26/11 98 lb 12.8 oz (44.815 kg)  05/28/11 103 lb 8 oz (46.947 kg)   Constitutional: She is thin/fit. appears well-developed and well-nourished. No distress.    Neck: Normal range of motion. Neck supple. No JVD present. No thyromegaly present.  Cardiovascular: Normal rate, regular rhythm and normal heart sounds.  No murmur heard. No BLE edema. Pulmonary/Chest: Effort normal and breath sounds normal. No respiratory distress. She has no wheezes.  Skin: mild inflammatory areas, flat and not ulcerated on BLE. Skin is warm and dry. No rash noted. No erythema.  Psychiatric: She has a normal mood and affect. Her behavior is normal. Judgment and thought content normal.   Lab Results  Component Value Date   WBC 8.6 05/28/2011   HGB 15.2* 05/28/2011   HCT 44.4 05/28/2011   PLT 279.0 05/28/2011   GLUCOSE 91 05/28/2011   ALT 23 05/28/2011   AST 33 05/28/2011   NA 139 05/28/2011   K 5.0 05/28/2011   CL 103 05/28/2011   CREATININE 0.6 05/28/2011   BUN 14 05/28/2011   CO2 27 05/28/2011   TSH 1.68 05/28/2011       Assessment & Plan:  See problem list. Medications and labs reviewed today.  Skin rash/itch - BLE below knees, different than prior dermatomyositis per pt ?bite vs inflammatory (EN) - improved with topical steroid OTC - continue same

## 2011-08-26 NOTE — Assessment & Plan Note (Signed)
Ongoing >6 mo associated with mild but unintentional weight loss Hx prior colo reviewed -  Will refer for "new" GI opinion and again recommended to stop PPI - see GERD

## 2011-09-29 ENCOUNTER — Ambulatory Visit (INDEPENDENT_AMBULATORY_CARE_PROVIDER_SITE_OTHER): Payer: Medicare Other | Admitting: Internal Medicine

## 2011-09-29 ENCOUNTER — Encounter: Payer: Self-pay | Admitting: Internal Medicine

## 2011-09-29 VITALS — BP 106/82 | HR 80 | Ht 60.25 in | Wt 100.5 lb

## 2011-09-29 DIAGNOSIS — K219 Gastro-esophageal reflux disease without esophagitis: Secondary | ICD-10-CM

## 2011-09-29 DIAGNOSIS — R198 Other specified symptoms and signs involving the digestive system and abdomen: Secondary | ICD-10-CM

## 2011-09-29 NOTE — Patient Instructions (Addendum)
Please follow up with Dr. Marina Goodell in 3 weeks.    Per Dr. Lamar Sprinkles instructions, discontinue your magnesium gluconate

## 2011-09-29 NOTE — Progress Notes (Signed)
HISTORY OF PRESENT ILLNESS:  April Chung is a 66 y.o. female with the below listed medical history who is sent today regarding change in bowel habits. Patient has apparently been a long-standing patient of Dr. Loreta Ave. She tells me that she is undergone multiple prior colonoscopies, upper endoscopy (possibly with esophageal dilation) and capsule endoscopy. She is not a very good historian as to why. Outside records have been requested for review. Her chief complaint today is that of "mushy" stools for the past year. She tells me that her stools are soft 5 days per week and normal 2 days per week. She only goes once per day. She denies constipation, diarrhea, abdominal pain, or weight loss. No bleeding. She is concerned that she does not have a good explanation for why her stools are "mushy". She denies steatorrhea. Upon questioning, she has been on magnesium gluconate for about one year. She is also on iron supplement and probiotic. Next, the patient tells me that she has infrequent reflux symptoms. She takes occasional over-the-counter remedies with relief. No dysphagia. Review of outside laboratories from April 2013 revealed a normal comprehensive metabolic panel and normal CBC with differential as well as TSH.  REVIEW OF SYSTEMS:  All non-GI ROS negative except for in rash and nose bleeds  Past Medical History  Diagnosis Date  . Dermatomyositis     remission on pred until 2008, rheum at [facility]  . Glaucoma   . GERD (gastroesophageal reflux disease)   . Status post dilation of esophageal narrowing     Past Surgical History  Procedure Date  . No past surgeries     Social History April Chung  reports that she quit smoking about 35 years ago. Her smoking use included Cigarettes. She has never used smokeless tobacco. She reports that she drinks alcohol. She reports that she does not use illicit drugs.  family history includes Dementia in her father and Heart disease in her maternal  grandfather and maternal grandmother.  No Known Allergies     PHYSICAL EXAMINATION: Vital signs: BP 106/82  Pulse 80  Ht 5' 0.25" (1.53 m)  Wt 100 lb 8 oz (45.587 kg)  BMI 19.47 kg/m2  Constitutional: generally well-appearing,thin, no acute distress Psychiatric: alert and oriented x3, cooperative Eyes: extraocular movements intact, anicteric, conjunctiva pink Mouth: oral pharynx moist, no lesions Neck: supple no lymphadenopathy Cardiovascular: heart regular rate and rhythm, no murmur Lungs: clear to auscultation bilaterally Abdomen: soft, nontender, nondistended, no obvious ascites, no peritoneal signs, normal bowel sounds, no organomegaly Rectal:omitted Extremities: no lower extremity edema bilaterally. Right arm is in a sling Skin: no lesions on visible extremities Neuro: No focal deficits.   ASSESSMENT:  #1. One-year history of soft stools as described. No worrisome features by history, exam, or laboratories. Apparently with extensive prior GI workup with Dr. Loreta Ave #2. GERD. Mild by her report. Outside records pending   PLAN:  #1. Holding meeting gluconate #2. Obtain outside records for review #3. Return to the office in a few weeks for followup. At that time, we can determine if additional workup is needed and appropriate.

## 2011-10-01 ENCOUNTER — Telehealth: Payer: Self-pay

## 2011-10-01 NOTE — Telephone Encounter (Signed)
Spoke with medical records to follow up on release faxed 09-29-11.  She told me it would be Friday before I got them.  I will follow up again at that point

## 2011-10-07 ENCOUNTER — Telehealth: Payer: Self-pay | Admitting: Internal Medicine

## 2011-10-07 NOTE — Telephone Encounter (Signed)
Forward 28 pages from [facility] to Dr. Yancey Flemings for review on 10-07-11 ym

## 2011-10-29 ENCOUNTER — Ambulatory Visit: Payer: Medicare Other | Admitting: Internal Medicine

## 2011-10-29 ENCOUNTER — Encounter: Payer: Self-pay | Admitting: Internal Medicine

## 2011-10-29 ENCOUNTER — Ambulatory Visit (INDEPENDENT_AMBULATORY_CARE_PROVIDER_SITE_OTHER): Payer: Medicare Other | Admitting: Internal Medicine

## 2011-10-29 ENCOUNTER — Other Ambulatory Visit (INDEPENDENT_AMBULATORY_CARE_PROVIDER_SITE_OTHER): Payer: Medicare Other

## 2011-10-29 VITALS — BP 130/82 | HR 64 | Ht 60.0 in | Wt 101.1 lb

## 2011-10-29 DIAGNOSIS — K219 Gastro-esophageal reflux disease without esophagitis: Secondary | ICD-10-CM

## 2011-10-29 DIAGNOSIS — D509 Iron deficiency anemia, unspecified: Secondary | ICD-10-CM

## 2011-10-29 DIAGNOSIS — R197 Diarrhea, unspecified: Secondary | ICD-10-CM

## 2011-10-29 DIAGNOSIS — R198 Other specified symptoms and signs involving the digestive system and abdomen: Secondary | ICD-10-CM

## 2011-10-29 DIAGNOSIS — R194 Change in bowel habit: Secondary | ICD-10-CM

## 2011-10-29 NOTE — Patient Instructions (Addendum)
Your physician has requested that you go to the basement for the following lab work before leaving today:  Celiac, Serum IGA

## 2011-10-29 NOTE — Progress Notes (Signed)
HISTORY OF PRESENT ILLNESS:  April Chung is a 66 y.o. female with the below listed medical history who presents today for followup. She was evaluated 09/29/2011 regarding change in bowel habits. Specifically, "mushy" stools once daily 5 times per week with normal stools once daily the remainder of the week. At the time of her last visit she reported an extensive GI history with Dr. Loreta Ave. I was able to receive and review a large volume of outside records. Patient had a colonoscopy in August 2006 with biopsies revealing increased intraepithelial lymphocytes. Evaluations have occurred for iron deficiency anemia, GI bleeding, and other GI symptoms. Unremarkable colonoscopy was performed in 2010 and again in 2012. Push enteroscopy in 2007 was unremarkable. Capsule endoscopy in March of 2006 was unremarkable with repeat negative exam in 2010. Hemoglobins have varied from 14.4 in 2008 to 8 or 10 in 2012. 13.8 earlier this year. She did have iron deficiency documented in 2010. She was also sent at [facility] and saw Dr. Achilles Dunk who did not feel the need for double balloon enteroscopy. Since her last visit she reports improvement in her bowel habits. She attributes this to discontinuing magnesium gluconate as suggested.  REVIEW OF SYSTEMS:  All non-GI ROS negative except for urinary leakage  Past Medical History  Diagnosis Date  . Dermatomyositis     remission on pred until 2008, rheum at [facility]  . Glaucoma   . GERD (gastroesophageal reflux disease)   . Status post dilation of esophageal narrowing     Past Surgical History  Procedure Date  . No past surgeries     Social History April Chung  reports that she quit smoking about 35 years ago. Her smoking use included Cigarettes. She has never used smokeless tobacco. She reports that she drinks alcohol. She reports that she does not use illicit drugs.  family history includes Dementia in her father and Heart disease in her maternal  grandfather and maternal grandmother.  No Known Allergies     PHYSICAL EXAMINATION: Vital signs: BP 130/82  Pulse 64  Ht 5' (1.524 m)  Wt 101 lb 2 oz (45.87 kg)  BMI 19.75 kg/m2 General: Well-developed, well-nourished, no acute distress HEENT: Sclerae are anicteric, conjunctiva pink. Oral mucosa intact Lungs: Clear Heart: Regular Abdomen: soft, nontender, nondistended, no obvious ascites, no peritoneal signs, normal bowel sounds. No organomegaly. Extremities: No edema Psychiatric: alert and oriented x3. Cooperative     ASSESSMENT:  #1. Recent complaints of soft stool secondary to magnesium gluconate. Improved after discontinuation #2. GERD by history. Mild #3. History of GI bleeding without definite cause ascertained #4. History of iron deficiency anemia without cause ascertained despite extensive workup   PLAN:  #1. Continue iron #2. Celiac sprue panel to screen for celiac disease #3. Surveillance colonoscopy for repeat screening due around 2022 #4. Interval GI followup as needed

## 2011-12-01 ENCOUNTER — Encounter: Payer: Self-pay | Admitting: Internal Medicine

## 2011-12-01 ENCOUNTER — Ambulatory Visit (INDEPENDENT_AMBULATORY_CARE_PROVIDER_SITE_OTHER): Payer: Medicare Other | Admitting: Internal Medicine

## 2011-12-01 VITALS — BP 142/90 | HR 66 | Temp 96.7°F | Ht 60.0 in | Wt 104.0 lb

## 2011-12-01 DIAGNOSIS — J329 Chronic sinusitis, unspecified: Secondary | ICD-10-CM

## 2011-12-01 DIAGNOSIS — J019 Acute sinusitis, unspecified: Secondary | ICD-10-CM

## 2011-12-01 MED ORDER — AZITHROMYCIN 250 MG PO TABS: ORAL_TABLET | ORAL | Status: DC

## 2011-12-01 NOTE — Patient Instructions (Addendum)

## 2011-12-01 NOTE — Progress Notes (Signed)
  Subjective:     April Chung is a 66 y.o. female who presents for evaluation of sinus pain. Symptoms include: congestion, cough, facial pain, itchy eyes, nasal congestion, post nasal drip and sore throat. Onset of symptoms was 5 days ago. Symptoms have been gradually worsening since that time. Past history is significant for no history of pneumonia or bronchitis. Patient is a non-smoker. Pt had gone to [facility] on 10/25, was given a prescription for Tessalon that is not really helping. The following portions of the patient's history were reviewed and updated as appropriate: allergies, current medications, past family history, past medical history, past social history, past surgical history and problem list.  Review of Systems A comprehensive review of systems was negative except for: Constitutional: positive for fatigue Eyes: positive for irritation and redness Ears, nose, mouth, throat, and face: positive for nasal congestion and sore throat Respiratory: positive for cough and sputum   Objective:    BP 142/90  Pulse 66  Temp 96.7 F (35.9 C) (Oral)  Ht 5' (1.524 m)  Wt 104 lb (47.174 kg)  BMI 20.31 kg/m2  SpO2 97%  General Appearance:    Alert, cooperative, no distress, appears stated age  Head:    Normocephalic, without obvious abnormality, atraumatic  Eyes:    PERRL, conjunctiva injected/corneas clear, EOM's intact, fundi  benign, both eyes  Ears:    Normal TM's and external ear canals, both ears  Nose:   Nares normal, septum midline, mucosa normal, no drainage.   Positive maxillary sinus tenderness  Throat:   Lips, mucosa, and tongue normal; teeth and gums normal. Positive PND  Neck:   Supple, symmetrical, trachea midline, no adenopathy;    thyroid:  no enlargement/tenderness/nodules; no carotid   bruit or JVD     Lungs:     Clear to auscultation bilaterally, respirations unlabored  Chest Wall:    No tenderness or deformity   Heart:    Regular rate and rhythm, S1 and  S2 normal, no murmur, rub   or gallop                    Skin:   Skin color, texture, turgor normal, no rashes or lesions  Lymph nodes:   Cervical, supraclavicular, and axillary nodes normal         Assessment:    Acute bacterial sinusitis.    Plan:    Nasal saline sprays. Azithromycin per medication orders. Follow up in a few days or as needed.

## 2011-12-08 ENCOUNTER — Ambulatory Visit (INDEPENDENT_AMBULATORY_CARE_PROVIDER_SITE_OTHER): Payer: Medicare Other | Admitting: Internal Medicine

## 2011-12-08 ENCOUNTER — Other Ambulatory Visit (INDEPENDENT_AMBULATORY_CARE_PROVIDER_SITE_OTHER): Payer: Medicare Other

## 2011-12-08 ENCOUNTER — Encounter: Payer: Self-pay | Admitting: Internal Medicine

## 2011-12-08 VITALS — BP 170/80 | HR 68 | Temp 97.1°F | Ht 60.0 in | Wt 102.8 lb

## 2011-12-08 DIAGNOSIS — R0981 Nasal congestion: Secondary | ICD-10-CM

## 2011-12-08 DIAGNOSIS — R413 Other amnesia: Secondary | ICD-10-CM

## 2011-12-08 DIAGNOSIS — I10 Essential (primary) hypertension: Secondary | ICD-10-CM

## 2011-12-08 DIAGNOSIS — J3489 Other specified disorders of nose and nasal sinuses: Secondary | ICD-10-CM

## 2011-12-08 LAB — CBC WITH DIFFERENTIAL/PLATELET
Basophils Relative: 0.6 % (ref 0.0–3.0)
Eosinophils Relative: 1.3 % (ref 0.0–5.0)
Lymphocytes Relative: 24.1 % (ref 12.0–46.0)
MCV: 96.9 fl (ref 78.0–100.0)
Monocytes Absolute: 0.7 10*3/uL (ref 0.1–1.0)
Monocytes Relative: 6.8 % (ref 3.0–12.0)
Neutrophils Relative %: 67.2 % (ref 43.0–77.0)
RBC: 4.51 Mil/uL (ref 3.87–5.11)
WBC: 9.6 10*3/uL (ref 4.5–10.5)

## 2011-12-08 LAB — BASIC METABOLIC PANEL
Chloride: 104 mEq/L (ref 96–112)
Creatinine, Ser: 0.7 mg/dL (ref 0.4–1.2)
GFR: 89.72 mL/min (ref >60.00)

## 2011-12-08 LAB — HEPATIC FUNCTION PANEL
ALT: 24 U/L (ref 0–35)
Albumin: 4 g/dL (ref 3.5–5.2)
Alkaline Phosphatase: 57 U/L (ref 39–117)
Total Protein: 7.7 g/dL (ref 6.0–8.3)

## 2011-12-08 LAB — TSH: TSH: 1.06 u[IU]/mL (ref 0.35–5.50)

## 2011-12-08 LAB — VITAMIN B12: Vitamin B-12: 1201 pg/mL — ABNORMAL HIGH (ref 211–911)

## 2011-12-08 MED ORDER — LOSARTAN POTASSIUM 50 MG PO TABS: 50.0000 mg | ORAL_TABLET | Freq: Every day | ORAL | Status: DC

## 2011-12-08 MED ORDER — FLUTICASONE PROPIONATE 50 MCG/ACT NA SUSP: 1.0000 | Freq: Every day | NASAL | Status: DC

## 2011-12-08 NOTE — Assessment & Plan Note (Signed)
Memory problems, progressive -  FH severe dementia in dad, dx dementia age 66, expired 68yo MMSE 64/30 (incorrectly named president and drawing 430 on clock face)  Check screening labs now and MRI brain Consider neuro refer given FH severe dz but not severe enough to recommended meds at this time No evidence for overlapping depression follow up 2 weeks

## 2011-12-08 NOTE — Patient Instructions (Signed)
It was good to see you today. We have reviewed your prior records including labs and tests today Start Flonase for nasal congestion and Losartan 50mg  once daily for high blood pressure - Your prescription(s) have been submitted to your pharmacy. Please take as directed and contact our office if you believe you are having problem(s) with the medication(s). Test(s) ordered today. Your results will be released to MyChart (or called to you) after review, usually within 72hours after test completion. If any changes need to be made, you will be notified at that same time. we'll make referral for MRI brain . Our office will contact you regarding appointment(s) once made. Please schedule followup in 2 weeks on blood pressure and memory check, call sooner if problems.

## 2011-12-08 NOTE — Progress Notes (Signed)
Subjective:    Patient ID: April Chung, female    DOB: 04-10-1945, 66 y.o.   MRN: [id number]  HPI Here for followup -seen October 28 for URI symptoms and diagnosed with sinusitis Treated with Z-Pak and Tessalon Perles  Also reports family concerned with "memory problems" Describes trouble with task completion and comprehension of instructions "My husband says I'm either not listening or not hearing" Ongoing symptoms greater than 6 months, but worse in past 30 days Denies overlapping symptoms of depression No other medication changes, no head trauma or weakness Denies difficulty with bill payments, recall or driving instructions Concerned because of family history same: dad with severe dementia prior to age 40  Past Medical History  Diagnosis Date  . Dermatomyositis     remission on pred until 2008, rheum at [facility]  . Glaucoma(365)   . GERD (gastroesophageal reflux disease)   . Status post dilation of esophageal narrowing     Review of Systems  Constitutional: Positive for fatigue. Negative for fever and unexpected weight change.  HENT: Positive for congestion, sore throat and rhinorrhea. Negative for neck pain, postnasal drip, sinus pressure and ear discharge.   Respiratory: Negative for cough and shortness of breath.   Cardiovascular: Negative for chest pain and palpitations.  Neurological: Negative for dizziness, tremors, syncope, facial asymmetry, speech difficulty, weakness, light-headedness, numbness and headaches.  Psychiatric/Behavioral: Positive for decreased concentration. Negative for suicidal ideas, hallucinations, behavioral problems, sleep disturbance, self-injury and dysphoric mood. The patient is not nervous/anxious and is not hyperactive.        Objective:   Physical Exam BP 170/80  Pulse 68  Temp 97.1 F (36.2 C) (Oral)  Ht 5' (1.524 m)  Wt 102 lb 12.8 oz (46.63 kg)  BMI 20.08 kg/m2  SpO2 97% Wt Readings from Last 3 Encounters:  12/08/11 102 lb  12.8 oz (46.63 kg)  12/01/11 104 lb (47.174 kg)  10/29/11 101 lb 2 oz (45.87 kg)   Constitutional: She is thin, appears well-developed and well-nourished. No distress.  HENT: Head: Normocephalic and atraumatic. No sinus tenderness to palpation Ears: B TMs ok, no erythema or effusion; Nose: Nose normal. Mouth/Throat: Oropharynx is clear and moist. No oropharyngeal exudate.  Eyes: Conjunctivae and EOM are normal. Pupils are equal, round, and reactive to light. No scleral icterus.  Neck: Normal range of motion. Neck supple. No JVD present. No thyromegaly present.  Cardiovascular: Normal rate, regular rhythm and normal heart sounds.  No murmur heard. No BLE edema. Pulmonary/Chest: Effort normal and breath sounds normal. No respiratory distress. She has no wheezes.  Neurological: She is alert and oriented to person, place, and time. No cranial nerve deficit. Coordination, speech, balance and gait are normal. MMSE 28 (unable to name president: "bush"; difficulties with hand placement drawing time on clock for 4:30) but oriented to self, place, purpose and time. 3 out of 3 recall at 5 minutes. Spelled world forward and back correctly, normal serial threes Skin: Skin is warm and dry. No rash noted. No erythema.  Psychiatric: She has a normal mood and affect. Her behavior is normal. Judgment and thought content normal.   Lab Results  Component Value Date   WBC 8.6 05/28/2011   HGB 15.2* 05/28/2011   HCT 44.4 05/28/2011   PLT 279.0 05/28/2011   GLUCOSE 91 05/28/2011   ALT 23 05/28/2011   AST 33 05/28/2011   NA 139 05/28/2011   K 5.0 05/28/2011   CL 103 05/28/2011   CREATININE 0.6 05/28/2011  BUN 14 05/28/2011   CO2 27 05/28/2011   TSH 1.68 05/28/2011   No results found for this basename: VITAMINB12      Assessment & Plan:   See problem list. Medications and labs reviewed today.   Hypertension -persistent on manual recheck by me  no prior hx same, denies use of stimulants common diet aids or  decongestants Start losartan 50 mg now and check labs as above Followup in 2 weeks, sooner if problems    Nasal congestion status post URI -no evidence of persisting infection following Z-Pak 10/26 Prescribed nasal steroid and advised to avoid decongestants and use "BP safe" over-the-counter remedies such as Coricidian HBP

## 2011-12-22 ENCOUNTER — Encounter: Payer: Self-pay | Admitting: Internal Medicine

## 2011-12-22 ENCOUNTER — Ambulatory Visit (INDEPENDENT_AMBULATORY_CARE_PROVIDER_SITE_OTHER): Payer: Medicare Other | Admitting: Internal Medicine

## 2011-12-22 VITALS — BP 132/84 | HR 55 | Temp 97.2°F | Ht 60.0 in | Wt 102.1 lb

## 2011-12-22 DIAGNOSIS — I10 Essential (primary) hypertension: Secondary | ICD-10-CM

## 2011-12-22 DIAGNOSIS — R413 Other amnesia: Secondary | ICD-10-CM

## 2011-12-22 NOTE — Patient Instructions (Signed)
It was good to see you today. Will follow up on MRI brain and call with results after review Medications reviewed, no changes at this time. we'll make referral to neurology for opinion about memory as dicussed - Our office will contact you regarding appointment(s) once made. Will consider sertraline for treatment of pseudo-dementia (stress) as discussed if neurology agrees there is not other evidence for true dementia

## 2011-12-22 NOTE — Progress Notes (Signed)
  Subjective:    Patient ID: April Chung, female    DOB: 1945/11/10, 66 y.o.   MRN: [id number]  HPI  here for follow up - "memory problems" Describes trouble with task completion and comprehension of instructions "My husband says I'm either not listening or not hearing" Ongoing symptoms greater than 6 months, but worse in past 30 days Denies overlapping symptoms of depression No other medication changes, no head trauma or weakness Denies difficulty with bill payments, recall or driving instructions Concerned because of family history same: dad with severe dementia prior to age 81  Past Medical History  Diagnosis Date  . Dermatomyositis     remission on pred until 2008, rheum at [facility]  . Glaucoma(365)   . GERD (gastroesophageal reflux disease)   . Status post dilation of esophageal narrowing     Review of Systems  Constitutional: Positive for fatigue. Negative for fever and unexpected weight change.  Respiratory: Negative for cough and shortness of breath.   Cardiovascular: Negative for chest pain and palpitations.  Neurological: Negative for dizziness, tremors, syncope, facial asymmetry, speech difficulty, weakness, light-headedness, numbness and headaches.  Psychiatric/Behavioral: Positive for decreased concentration. Negative for suicidal ideas, hallucinations, behavioral problems, sleep disturbance, self-injury and dysphoric mood. The patient is not nervous/anxious and is not hyperactive.        Objective:   Physical Exam  BP 132/84  Pulse 55  Temp 97.2 F (36.2 C) (Oral)  Ht 5' (1.524 m)  Wt 102 lb 1.9 oz (46.321 kg)  BMI 19.94 kg/m2  SpO2 98% Wt Readings from Last 3 Encounters:  12/22/11 102 lb 1.9 oz (46.321 kg)  12/08/11 102 lb 12.8 oz (46.63 kg)  12/01/11 104 lb (47.174 kg)   Constitutional: She is thin, appears well-developed and well-nourished. No distress.  Neck: Normal range of motion. Neck supple. No JVD present. No thyromegaly present.    Cardiovascular: Normal rate, regular rhythm and normal heart sounds.  No murmur heard. No BLE edema. Pulmonary/Chest: Effort normal and breath sounds normal. No respiratory distress. She has no wheezes.  Neurological: She is alert and oriented to person, place, and time. No cranial nerve deficit. Coordination, speech, balance and gait are normal. MMSE 28 (unable to name president: "bush, no - i was thinking carter" but oriented to self, place, purpose and time. 3 out of 3 recall at 5 minutes. Spelled world forward and back correctly, normal serial threes Skin: Skin is warm and dry. No rash noted. No erythema.  Psychiatric: She has a normal mood and affect. Her behavior is normal. Judgment and thought content normal.   Lab Results  Component Value Date   WBC 9.6 12/08/2011   HGB 14.7 12/08/2011   HCT 43.8 12/08/2011   PLT 435.0* 12/08/2011   GLUCOSE 89 12/08/2011   ALT 24 12/08/2011   AST 33 12/08/2011   NA 139 12/08/2011   K 3.9 12/08/2011   CL 104 12/08/2011   CREATININE 0.7 12/08/2011   BUN 11 12/08/2011   CO2 29 12/08/2011   TSH 1.06 12/08/2011   Lab Results  Component Value Date   VITAMINB12 1201* 12/08/2011      Assessment & Plan:   See problem list. Medications and labs reviewed today.

## 2011-12-22 NOTE — Assessment & Plan Note (Signed)
Memory problems, progressive - stress induced per pt FH severe dementia in dad, dx dementia age 66, expired 1yo MMSE 35/30 (incorrectly named president again today)  screening labs 12/2011 unremarkable follow up on MRI brain Refer to neuro given FH severe dz but suspect element "pseudodementia" precipitated by stress of caring for g-kids No evidence for overlapping depression but consider SSRI therapy after neuro eval if no other evidence for true dementia

## 2011-12-22 NOTE — Assessment & Plan Note (Addendum)
BP Readings from Last 3 Encounters:  12/22/11 132/84  12/08/11 170/80  12/01/11 142/90   started ARB for same 12/2011; FH HTN reviewed Doing well The current medical regimen is effective;  continue present plan and medications.

## 2011-12-23 ENCOUNTER — Ambulatory Visit
Admission: RE | Admit: 2011-12-23 | Discharge: 2011-12-23 | Disposition: A | Discharge status: Home / Self Care | Payer: Medicare Other | Source: Ambulatory Visit | Attending: Internal Medicine | Admitting: Internal Medicine

## 2011-12-23 DIAGNOSIS — R413 Other amnesia: Secondary | ICD-10-CM

## 2012-01-14 DIAGNOSIS — R9401 Abnormal electroencephalogram [EEG]: Secondary | ICD-10-CM

## 2012-01-14 HISTORY — DX: Abnormal electroencephalogram (EEG): R94.01

## 2012-01-26 ENCOUNTER — Ambulatory Visit: Payer: Medicare Other | Admitting: Internal Medicine

## 2012-01-29 ENCOUNTER — Ambulatory Visit (INDEPENDENT_AMBULATORY_CARE_PROVIDER_SITE_OTHER): Payer: Medicare Other | Admitting: Internal Medicine

## 2012-01-29 ENCOUNTER — Encounter: Payer: Self-pay | Admitting: Internal Medicine

## 2012-01-29 VITALS — BP 120/82 | HR 55 | Temp 97.5°F | Ht 60.0 in | Wt 103.0 lb

## 2012-01-29 DIAGNOSIS — R209 Unspecified disturbances of skin sensation: Secondary | ICD-10-CM

## 2012-01-29 DIAGNOSIS — I10 Essential (primary) hypertension: Secondary | ICD-10-CM

## 2012-01-29 DIAGNOSIS — F988 Other specified behavioral and emotional disorders with onset usually occurring in childhood and adolescence: Secondary | ICD-10-CM

## 2012-01-29 DIAGNOSIS — R238 Other skin changes: Secondary | ICD-10-CM

## 2012-01-29 DIAGNOSIS — R413 Other amnesia: Secondary | ICD-10-CM

## 2012-01-29 NOTE — Assessment & Plan Note (Signed)
BP Readings from Last 3 Encounters:  01/29/12 120/82  12/22/11 132/84  12/08/11 170/80   started ARB for same 12/2011; FH HTN reviewed Doing well, but may consider change to CCB for vasodilation to combat cold fingers - see above For now, current medical regimen is effective;  continue present plan and medications.

## 2012-01-29 NOTE — Progress Notes (Signed)
  Subjective:    Patient ID: April Chung, female    DOB: 04-Feb-1946, 66 y.o.   MRN: [id number]  HPI  here for follow up -  Wants thyroid checked again due to cold hands/feet Denies pain or Raynaud's color changes  Also reviewed chronic medical issues: "memory problems" 01/2012 s/p neuro eval - considered NPH until EEG abnormality suggested seizure - began Keppra and seems improved, feer spells - following with neuro for same Remains concerned for dementia because of family history same: dad with severe dementia prior to age 75  hypertension - on ARB - the patient reports compliance with medication(s) as prescribed. Denies adverse side effects.   Past Medical History  Diagnosis Date  . Dermatomyositis     remission on pred until 2008, rheum at [facility]  . Glaucoma(365)   . GERD (gastroesophageal reflux disease)   . Status post dilation of esophageal narrowing   . Hypertension     Review of Systems  Constitutional: Positive for fatigue. Negative for fever and unexpected weight change.  Respiratory: Negative for cough and shortness of breath.   Cardiovascular: Negative for chest pain and palpitations.  Neurological: Negative for dizziness, tremors, syncope, facial asymmetry, speech difficulty, weakness, light-headedness, numbness and headaches.  Psychiatric/Behavioral: Positive for decreased concentration. Negative for suicidal ideas, hallucinations, behavioral problems, sleep disturbance, self-injury and dysphoric mood. The patient is not nervous/anxious and is not hyperactive.        Objective:   Physical Exam  BP 120/82  Pulse 55  Temp 97.5 F (36.4 C) (Oral)  Ht 5' (1.524 m)  Wt 103 lb (46.72 kg)  BMI 20.12 kg/m2  SpO2 99% Wt Readings from Last 3 Encounters:  01/29/12 103 lb (46.72 kg)  12/22/11 102 lb 1.9 oz (46.321 kg)  12/08/11 102 lb 12.8 oz (46.63 kg)   Constitutional: She is thin, appears well-developed and well-nourished. No distress.  Neck: Normal  range of motion. Neck supple. No JVD present. No thyromegaly present.  Cardiovascular: Normal rate, regular rhythm and normal heart sounds.  No murmur heard. No BLE edema. Pulmonary/Chest: Effort normal and breath sounds normal. No respiratory distress. She has no wheezes.  Skin: hands "cold" but no raynaud's -Skin is warm and dry. No rash noted. No erythema.  Psychiatric: She has a normal mood and affect. Her behavior is normal. Judgment and thought content normal.   Lab Results  Component Value Date   WBC 9.6 12/08/2011   HGB 14.7 12/08/2011   HCT 43.8 12/08/2011   PLT 435.0* 12/08/2011   GLUCOSE 89 12/08/2011   ALT 24 12/08/2011   AST 33 12/08/2011   NA 139 12/08/2011   K 3.9 12/08/2011   CL 104 12/08/2011   CREATININE 0.7 12/08/2011   BUN 11 12/08/2011   CO2 29 12/08/2011   TSH 1.06 12/08/2011   Lab Results  Component Value Date   VITAMINB12 1201* 12/08/2011      Assessment & Plan:   See problem list. Medications and labs reviewed today.  Cold hands and feet - suspect vasospasm without raynnuad's color changes - ok to check labs at pt request - consider change ARB to low dose CCB to help with same - The patient is reassured that these symptoms do not appear to represent a serious or threatening condition.

## 2012-01-29 NOTE — Assessment & Plan Note (Signed)
S/p neuro eval 01/2012 - considering NPH as DDX but abnormal EEG prompting initiation of AED (keppra) - ?improved - pt reports continued distractibility and "hyper" personality Refer for battery testing of ADD with [facility] and continue follow up with neuro as onoging

## 2012-01-29 NOTE — Patient Instructions (Signed)
It was good to see you today. We have reviewed your prior records including labs and tests today Test(s) ordered today. Your results will be released to MyChart (or called to you) after review, usually within 72hours after test completion. If any changes need to be made, you will be notified at that same time. we'll make referral to behavior health for ?ADD testing to explain memory and "distractable hyperactivity" symptoms  . Our office will contact you regarding appointment(s) once made. Will consider change in your blood pressure medication to help your cold fingers IF labs normal

## 2012-02-09 ENCOUNTER — Ambulatory Visit (INDEPENDENT_AMBULATORY_CARE_PROVIDER_SITE_OTHER): Payer: No Typology Code available for payment source | Admitting: Licensed Clinical Social Worker

## 2012-02-09 DIAGNOSIS — F988 Other specified behavioral and emotional disorders with onset usually occurring in childhood and adolescence: Secondary | ICD-10-CM

## 2012-02-12 ENCOUNTER — Ambulatory Visit (INDEPENDENT_AMBULATORY_CARE_PROVIDER_SITE_OTHER): Payer: No Typology Code available for payment source | Admitting: Licensed Clinical Social Worker

## 2012-02-12 DIAGNOSIS — F4322 Adjustment disorder with anxiety: Secondary | ICD-10-CM

## 2012-02-24 ENCOUNTER — Encounter: Payer: Self-pay | Admitting: Internal Medicine

## 2012-02-24 ENCOUNTER — Telehealth: Payer: Self-pay | Admitting: *Deleted

## 2012-02-24 ENCOUNTER — Ambulatory Visit (INDEPENDENT_AMBULATORY_CARE_PROVIDER_SITE_OTHER): Payer: Medicare Other | Admitting: Internal Medicine

## 2012-02-24 VITALS — BP 110/72 | HR 60 | Temp 97.0°F | Ht 60.0 in | Wt 104.8 lb

## 2012-02-24 DIAGNOSIS — F988 Other specified behavioral and emotional disorders with onset usually occurring in childhood and adolescence: Secondary | ICD-10-CM | POA: Insufficient documentation

## 2012-02-24 DIAGNOSIS — G40909 Epilepsy, unspecified, not intractable, without status epilepticus: Secondary | ICD-10-CM | POA: Insufficient documentation

## 2012-02-24 DIAGNOSIS — I10 Essential (primary) hypertension: Secondary | ICD-10-CM

## 2012-02-24 DIAGNOSIS — R413 Other amnesia: Secondary | ICD-10-CM

## 2012-02-24 MED ORDER — AMPHETAMINE-DEXTROAMPHETAMINE 10 MG PO TABS: 10.0000 mg | ORAL_TABLET | Freq: Every day | ORAL | Status: DC

## 2012-02-24 NOTE — Assessment & Plan Note (Signed)
Dx by [facility] battery testing 02/09/12 Consider med therapy for same if ok with neuro (see above re: memory disturbance) Support offered and review of test at length today with pt/spouse

## 2012-02-24 NOTE — Telephone Encounter (Signed)
Pt return call back gave her md response. Rx left up front for pick-up...lmb

## 2012-02-24 NOTE — Telephone Encounter (Signed)
MD wanted me to contact pt to inform her she spoke with Dr. Anne Hahn and they both agreed starting pt on low dose adderral, but she need to f/u with dr. Anne Hahn in 2 months. Called pt no answer LMOM RTC...Raechel Chute

## 2012-02-24 NOTE — Patient Instructions (Addendum)
It was good to see you today. We have reviewed your prior records including labs and tests today Medications reviewed and updated, no changes at this time but will consider adding medication for ADD symptoms if ok with Dr. Anne Hahn.  After i discuss with Dr. Anne Hahn, we will call you with information about new medication Please schedule followup in 6 months, call sooner if problems.

## 2012-02-24 NOTE — Assessment & Plan Note (Signed)
BP Readings from Last 3 Encounters:  02/24/12 110/72  01/29/12 120/82  12/22/11 132/84   started ARB for same 12/2011; FH HTN reviewed Doing well, but may consider change to CCB for vasodilation to combat "cold fingers" if needed For now, current medical regimen is effective;  continue present plan and medications.

## 2012-02-24 NOTE — Progress Notes (Signed)
  Subjective:    Patient ID: April Chung, female    DOB: [birth year], 67 y.o.   MRN: [id number]  HPI  here for follow up -  reviewed chronic medical issues: ADD results reviewed -   "memory problems" 01/2012 s/p neuro eval - DDx considering NPH until EEG abnormality suggested seizure - began Keppra, ? improved - following with neuro for same. Remains concerned for dementia because of family history same: dad with severe dementia prior to age 37  hypertension - on ARB - the patient reports compliance with medication(s) as prescribed. Denies adverse side effects.   Past Medical History  Diagnosis Date  . Dermatomyositis     remission on pred until 2008, rheum at [facility]  . Glaucoma(365)   . GERD (gastroesophageal reflux disease)   . Status post dilation of esophageal narrowing   . Hypertension     Review of Systems  Constitutional: Positive for fatigue. Negative for fever and unexpected weight change.  Respiratory: Negative for cough and shortness of breath.   Cardiovascular: Negative for chest pain and palpitations.  Neurological: Negative for dizziness, tremors, syncope, facial asymmetry, speech difficulty, weakness, light-headedness, numbness and headaches.  Psychiatric/Behavioral: Positive for decreased concentration. Negative for suicidal ideas, hallucinations, behavioral problems, sleep disturbance, self-injury and dysphoric mood. The patient is not nervous/anxious and is not hyperactive.        Objective:   Physical Exam  BP 110/72  Pulse 60  Temp 97 F (36.1 C) (Oral)  Ht 5' (1.524 m)  Wt 104 lb 12.8 oz (47.537 kg)  BMI 20.47 kg/m2  SpO2 97% Wt Readings from Last 3 Encounters:  02/24/12 104 lb 12.8 oz (47.537 kg)  01/29/12 103 lb (46.72 kg)  12/22/11 102 lb 1.9 oz (46.321 kg)   Constitutional: She is thin, appears well-developed and well-nourished. No distress.  Neck: Normal range of motion. Neck supple. No JVD present. No thyromegaly present.    Cardiovascular: Normal rate, regular rhythm and normal heart sounds.  No murmur heard. No BLE edema. Pulmonary/Chest: Effort normal and breath sounds normal. No respiratory distress. She has no wheezes.  Neuro: MMSE 28/30 - misidentified month and year - but then corrected self Psychiatric: She has a normal mood and affect. Her behavior is normal. Judgment and thought content normal.   Lab Results  Component Value Date   WBC 9.6 12/08/2011   HGB 14.7 12/08/2011   HCT 43.8 12/08/2011   PLT 435.0* 12/08/2011   GLUCOSE 89 12/08/2011   ALT 24 12/08/2011   AST 33 12/08/2011   NA 139 12/08/2011   K 3.9 12/08/2011   CL 104 12/08/2011   CREATININE 0.7 12/08/2011   BUN 11 12/08/2011   CO2 29 12/08/2011   TSH 1.06 12/08/2011   Lab Results  Component Value Date   VITAMINB12 1201* 12/08/2011      Assessment & Plan:   See problem list. Medications and labs reviewed today.  Time spent with pt/family today 25 minutes, greater than 50% time spent counseling patient on testing for ADD, ongoing therapy for possible seizures and medication review. Also review of prior records and review/discussing case with Dr. Anne Hahn, neurologist to coordinate care   Per Dr Anne Hahn, ok for trial Adderall and to arrange follow up with his office in next 6-8 weeks to review

## 2012-02-24 NOTE — Assessment & Plan Note (Signed)
S/p neuro eval 01/2012 - considering NPH as DDX but abnormal EEG over L temporal region prompting initiation of AED (keppra) - symptoms unchanged - spouse and pt report continued distractibility and "hyper" thoughts/personality s/p battery testing for ADD with [facility] 02/09/12: low scores in areas consistent with ADD Will plan to start adderall (or other therapy) if ok with neuro (call into Dr Anne Hahn at time of this note) continue follow up with neuro as ongoing, but none planned/scheduled until 07/2012 at this time Also note FH - dad with "severe" early onset dementia and pt/family understandably concerned re: same

## 2012-03-14 ENCOUNTER — Other Ambulatory Visit: Payer: Self-pay | Admitting: Internal Medicine

## 2012-03-22 ENCOUNTER — Other Ambulatory Visit: Payer: Self-pay

## 2012-03-22 NOTE — Telephone Encounter (Signed)
Pt called [facility] requesting refill on amphetamine salts 10mg   Thanks

## 2012-03-22 NOTE — Telephone Encounter (Signed)
ok 

## 2012-03-23 MED ORDER — AMPHETAMINE-DEXTROAMPHETAMINE 10 MG PO TABS: 10.0000 mg | ORAL_TABLET | Freq: Every day | ORAL | Status: DC

## 2012-03-23 NOTE — Telephone Encounter (Signed)
Called pt no answer [facility] rx ready for pick-up.../lmb 

## 2012-04-05 ENCOUNTER — Encounter: Payer: Self-pay | Admitting: Internal Medicine

## 2012-04-05 ENCOUNTER — Ambulatory Visit (INDEPENDENT_AMBULATORY_CARE_PROVIDER_SITE_OTHER): Payer: Medicare Other | Admitting: Internal Medicine

## 2012-04-05 VITALS — BP 130/82 | HR 82 | Temp 97.3°F | Wt 101.8 lb

## 2012-04-05 DIAGNOSIS — I10 Essential (primary) hypertension: Secondary | ICD-10-CM

## 2012-04-05 DIAGNOSIS — M3390 Dermatopolymyositis, unspecified, organ involvement unspecified: Secondary | ICD-10-CM

## 2012-04-05 DIAGNOSIS — F988 Other specified behavioral and emotional disorders with onset usually occurring in childhood and adolescence: Secondary | ICD-10-CM

## 2012-04-05 DIAGNOSIS — I7300 Raynaud's syndrome without gangrene: Secondary | ICD-10-CM

## 2012-04-05 MED ORDER — AMLODIPINE BESYLATE 2.5 MG PO TABS: 2.5000 mg | ORAL_TABLET | Freq: Every day | ORAL | Status: DC

## 2012-04-05 NOTE — Patient Instructions (Signed)
It was good to see you today. We have reviewed your prior records including labs and tests today Medications reviewed and updated, will add low dose amlodipine for your fingers and hands  we'll make referral to Dr. Dareen Piano for local rheumatology opinion . Our office will contact you regarding appointment(s) once made. No other changes recommended at this time Keep followup as scheduled, call sooner if problems

## 2012-04-05 NOTE — Assessment & Plan Note (Signed)
BP Readings from Last 3 Encounters:  04/05/12 130/82  02/24/12 110/72  01/29/12 120/82   started ARB for same 12/2011; FH HTN reviewed Doing well, but will add low dose CCB for vasodilation to combat "cold fingers"

## 2012-04-05 NOTE — Progress Notes (Signed)
  Subjective:    Patient ID: April Chung, female    DOB: May 17, 1945, 67 y.o.   MRN: [id number]  HPI  here for follow up -  reviewed chronic medical issues: ADD results reviewed -   "memory problems" 01/2012 s/p neuro eval - DDx considering NPH until EEG abnormality suggested seizure - began Keppra, ? improved - following with neuro for same. Remains concerned for dementia because of family history same: dad with severe dementia prior to age 66  hypertension - on ARB - the patient reports compliance with medication(s) as prescribed. Denies adverse side effects.   Past Medical History  Diagnosis Date  . Dermatomyositis     remission on pred until 2008, rheum at [facility]  . Glaucoma(365)   . GERD (gastroesophageal reflux disease)   . Status post dilation of esophageal narrowing   . Hypertension   . Abnormal EEG 01/14/12    started on Keppra    Review of Systems  Constitutional: Positive for fatigue. Negative for fever and unexpected weight change.  Respiratory: Negative for cough and shortness of breath.   Cardiovascular: Negative for chest pain and palpitations.  Neurological: Negative for dizziness, tremors, syncope, facial asymmetry, speech difficulty, weakness, light-headedness, numbness and headaches.  Psychiatric/Behavioral: Positive for decreased concentration. Negative for suicidal ideas, hallucinations, behavioral problems, sleep disturbance, self-injury and dysphoric mood. The patient is not nervous/anxious and is not hyperactive.        Objective:   Physical Exam  BP 130/82  Pulse 82  Temp(Src) 97.3 F (36.3 C) (Oral)  Wt 101 lb 12.8 oz (46.176 kg)  BMI 19.88 kg/m2  SpO2 98% Wt Readings from Last 3 Encounters:  04/05/12 101 lb 12.8 oz (46.176 kg)  02/24/12 104 lb 12.8 oz (47.537 kg)  01/29/12 103 lb (46.72 kg)   Constitutional: She is thin, appears well-developed and well-nourished. No distress.  Neck: Normal range of motion. Neck supple. No JVD present.  No thyromegaly present.  Cardiovascular: Normal rate, regular rhythm and normal heart sounds.  No murmur heard. No BLE edema. Pulmonary/Chest: Effort normal and breath sounds normal. No respiratory distress. She has no wheezes.  MSkel; mild vasospasm changes B hands/majority of figers - cool to touch but good radial pulse - neurovascular intact Psychiatric: She has a normal mood and affect. Her behavior is normal. Judgment and thought content normal.   Lab Results  Component Value Date   WBC 9.6 12/08/2011   HGB 14.7 12/08/2011   HCT 43.8 12/08/2011   PLT 435.0* 12/08/2011   GLUCOSE 89 12/08/2011   ALT 24 12/08/2011   AST 33 12/08/2011   NA 139 12/08/2011   K 3.9 12/08/2011   CL 104 12/08/2011   CREATININE 0.7 12/08/2011   BUN 11 12/08/2011   CO2 29 12/08/2011   TSH 1.06 12/08/2011   Lab Results  Component Value Date   VITAMINB12 1201* 12/08/2011   No results found for this basename: ANA   Lab Results  Component Value Date   ESRSEDRATE 25* 12/08/2011       Assessment & Plan:   See problem list. Medications and labs reviewed today.  Vasospasm/raynaud's with sensation of cold hands/feet - add low dose CCB to ongoing ARB tx for htn

## 2012-04-05 NOTE — Assessment & Plan Note (Signed)
Reports skin>muscle symptoms during flare - in remission without recurrent flares since 2008 Off pred since 2008 Despite lack of skin or muscle changes at this time, referred to rheumatology for review of vasculitic symptoms, especially with increasing raynaud's

## 2012-04-05 NOTE — Assessment & Plan Note (Signed)
Dx by [facility] battery testing 02/09/12 Began Adderall for same 02/2012 (ok'd by neuro -see prior note re: memory disturbance) Patient and spouse feel symptoms unimproved, continue same without change

## 2012-04-19 ENCOUNTER — Other Ambulatory Visit: Payer: Self-pay

## 2012-04-19 MED ORDER — AMPHETAMINE-DEXTROAMPHETAMINE 10 MG PO TABS: 10.0000 mg | ORAL_TABLET | Freq: Every day | ORAL | Status: DC

## 2012-04-19 NOTE — Telephone Encounter (Signed)
Pt informed, Rx in cabinet for pt pick up  

## 2012-05-03 ENCOUNTER — Telehealth: Payer: Self-pay | Admitting: *Deleted

## 2012-05-03 ENCOUNTER — Telehealth: Payer: Self-pay

## 2012-05-03 MED ORDER — LISDEXAMFETAMINE DIMESYLATE 20 MG PO CAPS: 20.0000 mg | ORAL_CAPSULE | ORAL | Status: DC

## 2012-05-03 NOTE — Telephone Encounter (Signed)
Pt is willing to try Vyvanse, Rx printed for MD signature.

## 2012-05-03 NOTE — Telephone Encounter (Signed)
Left message on machine for pt to return my call  

## 2012-05-03 NOTE — Telephone Encounter (Signed)
Pt informed, Rx in cabinet for pt pick up  

## 2012-05-03 NOTE — Telephone Encounter (Signed)
Could try low dose Vyvanse in place of low dose Adderall - may print rx if pt agrees Otherwise, stop adderall and No alternative - could watch symptoms off all therapy and re-eval at next OV -

## 2012-05-03 NOTE — Telephone Encounter (Signed)
Pt called stating that Adderall 10 mg is causing severe nervousness, dry mouth, and hyperactivity which causes her to have to energy by the end of the day. Pt is requesting to D/C this medication and start an alternative if needed, please advise.

## 2012-05-03 NOTE — Telephone Encounter (Signed)
Fannie Knee form is complete and sent back to you.

## 2012-05-03 NOTE — Telephone Encounter (Signed)
Need prior authorization form signed fill out for patient medication Estridiol.  Chart on your desk. sue

## 2012-05-04 NOTE — Telephone Encounter (Signed)
Pharmacy, [facility], notified of prior authorization approved for medication Estridiol. Pharmacy checked and will fill for patient and notify patient of this.  sue

## 2012-05-06 ENCOUNTER — Telehealth: Payer: Self-pay | Admitting: *Deleted

## 2012-05-06 NOTE — Telephone Encounter (Signed)
Per pharmacist at [facility] prior authorization approved for patient Estradiol.April Chung

## 2012-05-18 ENCOUNTER — Telehealth: Payer: Self-pay

## 2012-05-18 NOTE — Telephone Encounter (Signed)
Pt called stating that Vyvanse is not helping with hyperactivity and difficulty concentrating. Pt also states that she believes that hyperactivity is affecting her memory as well. Pt says that since sxs seem to be worse on medications, perhaps she should discontinue Vyvanse with no alternative. Please advise

## 2012-05-18 NOTE — Telephone Encounter (Signed)
Ok to stop Vyvanse - will remain off any med for ADHD at this time thanks

## 2012-05-18 NOTE — Telephone Encounter (Signed)
Notified pt with md response.../lmb 

## 2012-06-09 ENCOUNTER — Telehealth: Payer: Self-pay | Admitting: Internal Medicine

## 2012-06-09 MED ORDER — AMPHETAMINE-DEXTROAMPHETAMINE 10 MG PO TABS: 10.0000 mg | ORAL_TABLET | Freq: Every day | ORAL | Status: DC

## 2012-06-09 NOTE — Telephone Encounter (Signed)
Called pt no answer [facility] rx ready for pick-up.../lmb 

## 2012-06-09 NOTE — Telephone Encounter (Signed)
Pt req refill for Adderall. Please advise.  °

## 2012-06-09 NOTE — Telephone Encounter (Signed)
ok 

## 2012-07-18 ENCOUNTER — Other Ambulatory Visit: Payer: Self-pay | Admitting: Neurology

## 2012-07-19 ENCOUNTER — Other Ambulatory Visit: Payer: Self-pay | Admitting: Neurology

## 2012-08-12 ENCOUNTER — Other Ambulatory Visit: Payer: Self-pay | Admitting: Internal Medicine

## 2012-08-12 NOTE — Telephone Encounter (Signed)
Ok to refill - please print and i will sign for pick up

## 2012-08-12 NOTE — Telephone Encounter (Signed)
Patient would like a refill rx on aderall.  She has one more dose left for 08/13/2012.  Thanks!

## 2012-08-13 MED ORDER — AMPHETAMINE-DEXTROAMPHETAMINE 10 MG PO TABS: 10.0000 mg | ORAL_TABLET | Freq: Every day | ORAL | Status: DC

## 2012-08-13 NOTE — Telephone Encounter (Signed)
Patient notified script is available for pick up. She is requesting 2 more scripts, dated to have on file at the pharmacy

## 2012-08-13 NOTE — Telephone Encounter (Signed)
Ok: 09/2012 and 10/2012 rx done

## 2012-08-13 NOTE — Telephone Encounter (Signed)
Signed this AM - given to CMA here at [facility] - thanks

## 2012-08-13 NOTE — Telephone Encounter (Signed)
Scripts put in the front desk area

## 2012-08-24 ENCOUNTER — Encounter: Payer: Self-pay | Admitting: Internal Medicine

## 2012-08-24 ENCOUNTER — Other Ambulatory Visit (INDEPENDENT_AMBULATORY_CARE_PROVIDER_SITE_OTHER): Payer: Medicare Other

## 2012-08-24 ENCOUNTER — Ambulatory Visit (INDEPENDENT_AMBULATORY_CARE_PROVIDER_SITE_OTHER): Payer: Medicare Other | Admitting: Internal Medicine

## 2012-08-24 VITALS — BP 152/80 | HR 65 | Temp 97.9°F | Wt 101.0 lb

## 2012-08-24 DIAGNOSIS — R413 Other amnesia: Secondary | ICD-10-CM

## 2012-08-24 DIAGNOSIS — I10 Essential (primary) hypertension: Secondary | ICD-10-CM

## 2012-08-24 DIAGNOSIS — Z23 Encounter for immunization: Secondary | ICD-10-CM

## 2012-08-24 LAB — BASIC METABOLIC PANEL
BUN: 14 mg/dL (ref 6–23)
CO2: 28 mEq/L (ref 19–32)
Calcium: 9.8 mg/dL (ref 8.4–10.5)
Creatinine, Ser: 0.7 mg/dL (ref 0.4–1.2)
Glucose, Bld: 82 mg/dL (ref 70–99)

## 2012-08-24 LAB — LDL CHOLESTEROL, DIRECT: Direct LDL: 129.4 mg/dL

## 2012-08-24 LAB — LIPID PANEL: Triglycerides: 137 mg/dL (ref 0.0–149.0)

## 2012-08-24 MED ORDER — LOSARTAN POTASSIUM 100 MG PO TABS: 100.0000 mg | ORAL_TABLET | Freq: Every day | ORAL | Status: DC

## 2012-08-24 NOTE — Assessment & Plan Note (Signed)
S/p neuro eval 01/2012 - considering NPH as DDX but abnormal EEG over L temporal region prompting initiation of AED (keppra) - Symptoms improved, but spouse and pt report continued distractibility and "hyper" thoughts/personality s/p battery testing for ADD with [facility] 02/09/12: low scores in areas consistent with ADD started adderall after discussion/clearing with neuro ( Dr Anne Hahn) - pt feels improved function continue follow up with neuro as ongoing Also note FH - dad with "severe" early onset dementia and pt/family understandably concerned re: same

## 2012-08-24 NOTE — Assessment & Plan Note (Signed)
BP Readings from Last 3 Encounters:  08/24/12 152/80  04/05/12 130/82  02/24/12 110/72   started ARB for same 12/2011; FH HTN reviewed added low dose CCB 04/2012 for vasodilation to combat "cold fingers" Still subop control - titrate up ARB to max dose now Check labs including lipids

## 2012-08-24 NOTE — Progress Notes (Signed)
  Subjective:    Patient ID: April Chung, female    DOB: 1946/01/04, 67 y.o.   MRN: [id number]  HPI  here for follow up -  reviewed chronic medical issues:  "memory problems" 01/2012 s/p neuro eval - DDx considering NPH until EEG abnormality suggested seizure - began Keppra, ? improved - following with neuro for same. Remains concerned for dementia because of family history same: dad with severe dementia prior to age 53. Also complicated by neuropsyc testing that suggested ADD 04/2012 - on Adderall for same  hypertension - on ARB and amlodipine - the patient reports compliance with medication(s) as prescribed. Denies adverse side effects.   Past Medical History  Diagnosis Date  . Dermatomyositis     remission on pred until 2008, rheum at [facility]  . Glaucoma   . GERD (gastroesophageal reflux disease)   . Status post dilation of esophageal narrowing   . Hypertension   . Abnormal EEG 01/14/12    started on Keppra    Review of Systems  Constitutional: Positive for fatigue. Negative for fever and unexpected weight change.  Respiratory: Negative for cough and shortness of breath.   Cardiovascular: Negative for chest pain and palpitations.  Neurological: Negative for dizziness, tremors, syncope, facial asymmetry, speech difficulty, weakness, light-headedness, numbness and headaches.  Psychiatric/Behavioral: Positive for decreased concentration. Negative for suicidal ideas, hallucinations, behavioral problems, sleep disturbance, self-injury and dysphoric mood. The patient is not nervous/anxious and is not hyperactive.        Objective:   Physical Exam  BP 152/80  Pulse 65  Temp(Src) 97.9 F (36.6 C) (Oral)  Wt 101 lb (45.813 kg)  BMI 19.73 kg/m2  SpO2 97% Wt Readings from Last 3 Encounters:  08/24/12 101 lb (45.813 kg)  04/05/12 101 lb 12.8 oz (46.176 kg)  02/24/12 104 lb 12.8 oz (47.537 kg)   Constitutional: She is thin, appears well-developed and well-nourished. No  distress.  Neck: Normal range of motion. Neck supple. No JVD present. No thyromegaly present.  Cardiovascular: Normal rate, regular rhythm and normal heart sounds.  No murmur heard. No BLE edema. Pulmonary/Chest: Effort normal and breath sounds normal. No respiratory distress. She has no wheezes.  Psychiatric: She has a normal mood and affect. Her behavior is normal. Judgment and thought content normal.   Lab Results  Component Value Date   WBC 9.6 12/08/2011   HGB 14.7 12/08/2011   HCT 43.8 12/08/2011   PLT 435.0* 12/08/2011   GLUCOSE 89 12/08/2011   ALT 24 12/08/2011   AST 33 12/08/2011   NA 139 12/08/2011   K 3.9 12/08/2011   CL 104 12/08/2011   CREATININE 0.7 12/08/2011   BUN 11 12/08/2011   CO2 29 12/08/2011   TSH 1.06 12/08/2011   Lab Results  Component Value Date   VITAMINB12 1201* 12/08/2011   No results found for this basename: ANA   Lab Results  Component Value Date   ESRSEDRATE 25* 12/08/2011       Assessment & Plan:   See problem list. Medications and labs reviewed today.

## 2012-08-24 NOTE — Patient Instructions (Signed)
It was good to see you today. We have reviewed your prior records including labs and tests today Medications reviewed and updated, will increase losartan to g daily for blood pressure control - no other changes  Your prescription(s) have been submitted to your pharmacy. Please take as directed and contact our office if you believe you are having problem(s) with the medication(s). Test(s) ordered today. Your results will be released to MyChart (or called to you) after review, usually within 72hours after test completion. If any changes need to be made, you will be notified at that same time. Please schedule followup in 6 months, call sooner if problems. Tetanus (Tdap) immunization updated today

## 2012-08-30 LAB — HM MAMMOGRAPHY: HM Mammogram: NEGATIVE

## 2012-08-31 ENCOUNTER — Encounter: Payer: Self-pay | Admitting: Internal Medicine

## 2012-09-27 ENCOUNTER — Ambulatory Visit: Payer: Self-pay | Admitting: Nurse Practitioner

## 2012-09-29 ENCOUNTER — Encounter: Payer: Self-pay | Admitting: Nurse Practitioner

## 2012-10-30 ENCOUNTER — Other Ambulatory Visit: Payer: Self-pay | Admitting: Nurse Practitioner

## 2012-11-01 ENCOUNTER — Other Ambulatory Visit: Payer: Self-pay

## 2012-11-01 ENCOUNTER — Other Ambulatory Visit: Payer: Self-pay | Admitting: Nurse Practitioner

## 2012-11-01 NOTE — Telephone Encounter (Signed)
Pt requested 90 day supply instead

## 2012-11-01 NOTE — Telephone Encounter (Signed)
Opened in error

## 2012-11-01 NOTE — Telephone Encounter (Signed)
[facility] is 11-09-12. rx given at [facility] 11-04-11. #30 with no refills given

## 2012-11-04 ENCOUNTER — Ambulatory Visit: Payer: Self-pay | Admitting: Nurse Practitioner

## 2012-11-05 ENCOUNTER — Ambulatory Visit: Payer: Self-pay | Admitting: Nurse Practitioner

## 2012-11-08 ENCOUNTER — Encounter: Payer: Self-pay | Admitting: Nurse Practitioner

## 2012-11-09 ENCOUNTER — Ambulatory Visit (INDEPENDENT_AMBULATORY_CARE_PROVIDER_SITE_OTHER): Payer: Medicare Other | Admitting: Nurse Practitioner

## 2012-11-09 ENCOUNTER — Encounter: Payer: Self-pay | Admitting: Nurse Practitioner

## 2012-11-09 VITALS — BP 120/60 | HR 80 | Resp 16 | Ht 60.0 in | Wt 100.0 lb

## 2012-11-09 DIAGNOSIS — M8580 Other specified disorders of bone density and structure, unspecified site: Secondary | ICD-10-CM | POA: Insufficient documentation

## 2012-11-09 DIAGNOSIS — E559 Vitamin D deficiency, unspecified: Secondary | ICD-10-CM

## 2012-11-09 DIAGNOSIS — Z01419 Encounter for gynecological examination (general) (routine) without abnormal findings: Secondary | ICD-10-CM

## 2012-11-09 DIAGNOSIS — M3390 Dermatopolymyositis, unspecified, organ involvement unspecified: Secondary | ICD-10-CM

## 2012-11-09 DIAGNOSIS — M899 Disorder of bone, unspecified: Secondary | ICD-10-CM

## 2012-11-09 DIAGNOSIS — R32 Unspecified urinary incontinence: Secondary | ICD-10-CM | POA: Insufficient documentation

## 2012-11-09 MED ORDER — VITAMIN D (ERGOCALCIFEROL) 1.25 MG (50000 UNIT) PO CAPS: 50000.0000 [IU] | ORAL_CAPSULE | ORAL | Status: DC

## 2012-11-09 MED ORDER — ESTRADIOL 0.1 MG/GM VA CREA: 1.0000 g | TOPICAL_CREAM | VAGINAL | Status: DC

## 2012-11-09 MED ORDER — MEDROXYPROGESTERONE ACETATE 2.5 MG PO TABS: ORAL_TABLET | ORAL | Status: DC

## 2012-11-09 MED ORDER — ESTRADIOL 0.5 MG PO TABS: 0.5000 mg | ORAL_TABLET | Freq: Every day | ORAL | Status: DC

## 2012-11-09 NOTE — Patient Instructions (Addendum)

## 2012-11-09 NOTE — Progress Notes (Signed)
Patient ID: April Chung, female   DOB: Apr 25, 1945, 67 y.o.   MRN: [id number] 67 y.o. [id number] Married Caucasian Fe here for annual exam.  Patient feels well.  Remains active.  Her mother age 52 has just moved to a retirement facility.  No LMP recorded. Patient is postmenopausal.          Sexually active: yes  The current method of family planning is post menopausal status.    Exercising: yes  Home exercise routine includes walking 1/2 hrs per day. at least 3 times per week.  Also uses hand weights. Smoker:  Former   Health Maintenance: Pap: 08/14/08 WNL MMG: 08/31/12, BI-RADS 1, negative Colonoscopy: 03/28/10, rpt 5 years BMD: 05/12/08  T Score: spine -0.4; right femur neck -2.0; total -1.4  TDaP: 08/24/12 Shingle vaccine: 2013 Labs: PCP   reports that she quit smoking about 36 years ago. Her smoking use included Cigarettes. She has a 15 pack-year smoking history. She has never used smokeless tobacco. She reports that she drinks about 4.2 ounces of alcohol per week. She reports that she does not use illicit drugs.  Past Medical History  Diagnosis Date  . Dermatomyositis     remission on pred until 2008, rheum at [facility]  . Glaucoma   . GERD (gastroesophageal reflux disease)     agravated with Fosamax  . Status post dilation of esophageal narrowing   . Abnormal EEG 01/14/12    started on Keppra  . Hypertension 2013  . Vitamin D deficiency disease   . Urinary incontinence, mixed 07/01/11    Fitted for 2.75 " Ring Pessary with support  . PMB (postmenopausal bleeding) 08/2003    Past Surgical History  Procedure Laterality Date  . No past surgeries    . Colonoscopy  09/25/04    Dr. Loreta Ave  . Esophagogastroduodenoscopy endoscopy  04/29/04    anemia without cause  . Colonoscopy  02/2009  . Endometrial biopsy  08/24/03    weak proliferative endo, SHGM neg.    Current Outpatient Prescriptions  Medication Sig Dispense Refill  . amLODipine (NORVASC) 2.5 MG tablet Take 1 tablet (2.5 mg  total) by mouth daily.  30 tablet  11  . aspirin 81 MG tablet Take 81 mg by mouth daily.      . cyclobenzaprine (FLEXERIL) 5 MG tablet Take 2.5 mg by mouth at bedtime.      Marland Kitchen estradiol (ESTRACE VAGINAL) 0.1 MG/GM vaginal cream Place 0.25 Applicatorfuls vaginally 2 (two) times a week. Use as directed  42.5 g  3  . estradiol (ESTRACE) 0.5 MG tablet Take 1 tablet (0.5 mg total) by mouth daily.  90 tablet  3  . Ferrous Sulfate (IRON) 325 (65 FE) MG TABS Take by mouth daily.      . fish oil-omega-3 fatty acids 1000 MG capsule Take 1 g by mouth daily.      . fluticasone (FLONASE) 50 MCG/ACT nasal spray Place 1 spray into the nose daily.  16 g  2  . levETIRAcetam (KEPPRA) 250 MG tablet TAKE 1 TABLET BY MOUTH TWICE A DAY  60 tablet  6  . losartan (COZAAR) 100 MG tablet Take 50 mg by mouth daily.      Marland Kitchen LUMIGAN 0.01 % SOLN Place 1 drop into both eyes at bedtime.       . medroxyPROGESTERone (PROVERA) 2.5 MG tablet TAKE 1 TABLET BY MOUTH EVERY DAY  90 tablet  3  . Multiple Vitamins-Calcium (ONE-A-DAY WOMENS PO) Take by  mouth daily.      . Multiple Vitamins-Minerals (ICAPS MV) TABS Take 1 each by mouth 2 (two) times daily.      Marland Kitchen PATADAY 0.2 % SOLN Place into both eyes daily as needed.      . Probiotic Product (ALIGN) 4 MG CAPS Take by mouth every morning.      . vitamin C (ASCORBIC ACID) 500 MG tablet Take 500 mg by mouth daily.      . famotidine (PEPCID) 20 MG tablet TAKE 1 TABLET (20 MG TOTAL) BY MOUTH 2 (TWO) TIMES DAILY AS NEEDED FOR HEARTBURN.  60 tablet  1  . Vitamin D, Ergocalciferol, (DRISDOL) 50000 UNITS CAPS capsule Take 1 capsule (50,000 Units total) by mouth every 21 ( twenty-one) days.  30 capsule  2   No current facility-administered medications for this visit.    Family History  Problem Relation Age of Onset  . Dementia Father     died age 34, otherwise healthy  . Heart disease Maternal Grandfather   . Heart disease Maternal Grandmother     ROS:  Pertinent items are noted in HPI.   Otherwise, a comprehensive ROS was negative.  Exam:   BP 120/60  Pulse 80  Resp 16  Ht 5' (1.524 m)  Wt 100 lb (45.36 kg)  BMI 19.53 kg/m2 Height: 5' (152.4 cm)  Ht Readings from Last 3 Encounters:  11/09/12 5' (1.524 m)  02/24/12 5' (1.524 m)  01/29/12 5' (1.524 m)    General appearance: alert, cooperative and appears stated age Head: Normocephalic, without obvious abnormality, atraumatic Neck: no adenopathy, supple, symmetrical, trachea midline and thyroid normal to inspection and palpation Lungs: clear to auscultation bilaterally Breasts: normal appearance, no masses or tenderness Heart: regular rate and rhythm Abdomen: soft, non-tender; no masses,  no organomegaly Extremities: extremities normal, atraumatic, no cyanosis or edema Skin: Skin color, texture, turgor normal. No rashes or lesions Lymph nodes: Cervical, supraclavicular, and axillary nodes normal. No abnormal inguinal nodes palpated Neurologic: Grossly normal   Pelvic: External genitalia:  no lesions, Pessary is out at visit              Urethra:  normal appearing urethra with no masses, tenderness or lesions              Bartholin's and Skene's: normal                 Vagina: normal appearing vagina with normal color and discharge, no lesions              Cervix: anteverted              Pap taken: no Bimanual Exam:  Uterus:  normal size, contour, position, consistency, mobility, non-tender              Adnexa: no mass, fullness, tenderness               Rectovaginal: Confirms               Anus:  normal sphincter tone, no lesions  A:  Well Woman with normal exam  Postmenopausal on HRT and wants to continue  History of mixed urinary incontinence with use of 2.75 " ring pessary with support since 07/01/2011  Osteopenia off Actonel 05/2008 and managed by PCP ([facility] with Fosamax  Vit D deficiency  History of Dermatomyositis and off Steroids for some time - followed at [facility]    P:   Pap smear as per  guidelines Not  indicated  Mammogram is due 7/15  Patient will continue to use pessary as needed, sometimes at home can leave out for several days. She will continue to use Premarin vaginal cream 1/2  - 1 gm twice weekly. Refill is given  Will refill estradiol 0.5 mg to take 1/2 tablet daily and Provera 2.5 mg daily.  Discussed potential risk of DVT, CVA, cancer, etc  Refill on vit D and will follow with  lab results - but usually needs more over the winter  Counseled on breast self exam, adequate intake of calcium and vitamin D, diet and exercise return annually or prn  An After Visit Summary was printed and given to the patient.

## 2012-11-10 ENCOUNTER — Other Ambulatory Visit: Payer: Self-pay | Admitting: Nurse Practitioner

## 2012-11-10 LAB — VITAMIN D 25 HYDROXY (VIT D DEFICIENCY, FRACTURES): Vit D, 25-Hydroxy: 54 ng/mL (ref 30–89)

## 2012-11-11 ENCOUNTER — Other Ambulatory Visit: Payer: Self-pay | Admitting: Nurse Practitioner

## 2012-11-11 NOTE — Telephone Encounter (Signed)
Pt is requesting supply. This rx says 1gram vaginally twice a week. The rx you sent to the pharmacy said 0.25mg  of estrace twice a week but in your note you wrote premarin creak 1/2 to 1 gram. Please approve or deny rx based on what you would like the patient to take

## 2012-11-12 NOTE — Progress Notes (Signed)
Encounter reviewed by Dr. Brook Silva.  

## 2012-11-30 ENCOUNTER — Ambulatory Visit (INDEPENDENT_AMBULATORY_CARE_PROVIDER_SITE_OTHER): Payer: Medicare Other | Admitting: Internal Medicine

## 2012-11-30 ENCOUNTER — Encounter: Payer: Self-pay | Admitting: Internal Medicine

## 2012-11-30 VITALS — BP 110/82 | HR 91 | Temp 97.6°F | Wt 103.0 lb

## 2012-11-30 DIAGNOSIS — J069 Acute upper respiratory infection, unspecified: Secondary | ICD-10-CM

## 2012-11-30 DIAGNOSIS — J309 Allergic rhinitis, unspecified: Secondary | ICD-10-CM

## 2012-11-30 MED ORDER — AZITHROMYCIN 250 MG PO TABS: ORAL_TABLET | ORAL | Status: DC

## 2012-11-30 NOTE — Patient Instructions (Signed)

## 2012-11-30 NOTE — Progress Notes (Signed)
HPI  Pt presents to the clinic today with c/o cold symptoms x 8 days. She c/o nasal sutffiness, sore throat, and mild fatigue. She denies headache, ear pain or cough. She has taken some chloraseptic spray and tylenol, with on minimal relief. She does still feel like she is getting worse. She denies fever, chills or body aches. She has no history of asthma or allergies. She has not had sick contacts.   Review of Systems      Past Medical History  Diagnosis Date  . Dermatomyositis     remission on pred until 2008, rheum at [facility]  . Glaucoma   . GERD (gastroesophageal reflux disease)     agravated with Fosamax  . Status post dilation of esophageal narrowing   . Abnormal EEG 01/14/12    started on Keppra  . Hypertension 2013  . Vitamin D deficiency disease   . Urinary incontinence, mixed 07/01/11    Fitted for 2.75 " Ring Pessary with support  . PMB (postmenopausal bleeding) 08/2003    Family History  Problem Relation Age of Onset  . Dementia Father     died age 44, otherwise healthy  . Heart disease Maternal Grandfather   . Heart disease Maternal Grandmother     History   Social History  . Marital Status: Married    Spouse Name: N/A    Number of Children: 1  . Years of Education: N/A   Occupational History  . housewife    Social History Main Topics  . Smoking status: Former Smoker -- 1.00 packs/day for 15 years    Types: Cigarettes    Quit date: 02/04/1976  . Smokeless tobacco: Never Used  . Alcohol Use: 4.2 oz/week    7 Glasses of wine per week     Comment: 1 glass of wine per day  . Drug Use: No  . Sexual Activity: Yes    Partners: Male    Birth Control/ Protection: Post-menopausal   Other Topics Concern  . 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 Denies headache, fever or abrupt weight  changes.  HEENT:  Positive nasal congestion, sore throat. Denies eye redness, eye pain, pressure behind the eyes, facial pain,  ear pain, ringing in the ears, wax buildup, runny nose or bloody nose. Respiratory:  Denies difficulty breathing or shortness of breath.  Cardiovascular: Denies chest pain, chest tightness, palpitations or swelling in the hands or feet.   No other specific complaints in a complete review of systems (except as listed in HPI above).  Objective:   BP 110/82  Pulse 91  Temp(Src) 97.6 F (36.4 C) (Oral)  Wt 103 lb (46.72 kg)  BMI 20.12 kg/m2  SpO2 98% Wt Readings from Last 3 Encounters:  11/30/12 103 lb (46.72 kg)  11/09/12 100 lb (45.36 kg)  08/24/12 101 lb (45.813 kg)     General: Appears her stated age, well developed, well nourished in NAD. HEENT: Head: normal shape and size; Eyes: sclera white, no icterus, conjunctiva pink, PERRLA and EOMs intact; Ears: Tm's gray and intact, normal light reflex; Nose: mucosa pink and moist, septum midline; Throat/Mouth: + PND. Teeth present, mucosa erythematous and moist, no exudate noted, no lesions or ulcerations noted.  Neck: Mild cervical lymphadenopathy. Neck supple, trachea midline. No massses, lumps or  thyromegaly present.  Cardiovascular: Normal rate and rhythm. S1,S2 noted.  No murmur, rubs or gallops noted. No JVD or BLE edema. No carotid bruits noted. Pulmonary/Chest: Normal effort and positive vesicular breath sounds. No respiratory distress. No wheezes, rales or ronchi noted.      Assessment & Plan:   Upper Respiratory Infection:  Get some rest and drink plenty of water Do salt water gargles for the sore throat eRx for Azithromax x 5 days  Allergic Rhinitis:  Take Allegra OTC daily  RTC as needed or if symptoms persist.

## 2012-12-06 ENCOUNTER — Ambulatory Visit: Payer: Self-pay | Admitting: Nurse Practitioner

## 2013-01-28 ENCOUNTER — Encounter: Payer: Self-pay | Admitting: Nurse Practitioner

## 2013-01-28 ENCOUNTER — Ambulatory Visit (INDEPENDENT_AMBULATORY_CARE_PROVIDER_SITE_OTHER): Payer: Medicare Other | Admitting: Nurse Practitioner

## 2013-01-28 ENCOUNTER — Encounter (INDEPENDENT_AMBULATORY_CARE_PROVIDER_SITE_OTHER): Payer: Self-pay

## 2013-01-28 VITALS — BP 118/73 | HR 68 | Ht 61.75 in | Wt 99.0 lb

## 2013-01-28 DIAGNOSIS — R413 Other amnesia: Secondary | ICD-10-CM

## 2013-01-28 DIAGNOSIS — G40909 Epilepsy, unspecified, not intractable, without status epilepticus: Secondary | ICD-10-CM

## 2013-01-28 MED ORDER — LEVETIRACETAM 250 MG PO TABS: 250.0000 mg | ORAL_TABLET | Freq: Two times a day (BID) | ORAL | Status: DC

## 2013-01-28 NOTE — Progress Notes (Signed)
I have read the note, and I agree with the clinical assessment and plan.  WILLIS,CHARLES KEITH   

## 2013-01-28 NOTE — Progress Notes (Signed)
[facility]  PATIENT: April Chung DOB: 1945-12-21   REASON FOR VISIT: Followup for seizure disorder and memory loss   HISTORY OF PRESENT ILLNESS: April Chung, 67 year old returns for followup. She has had an abnormal EEG in the past and is taking  Keppra 250 twice daily. She has not had any seizure activity. She has not had any problems with balance issues she has not fallen. In terms of her memory she is a perfectionist and feels like she has to write things down especially if she has to relate thoughts  to other folks. She also has some anxiety but not depression. She denies any numbness or weakness of the face arms or legs. She has some urinary frequency. Blood testing for memory in the past have been normal.    HISTORY:of some issues with short-term memory over the last 6 months. The patient has difficulty remembering events that occurred recently, within the last minutes or hours. The patient also has had some issues with a feeling of anxiety, not depression. The patient indicates that the anxiety may in part come from the fact that she feels that she cognitively is not functioning normally. The patient is a perfectionist, and she is well-organized, and she takes notes frequently. The patient operates a motor vehicle, and she denies any problems with directions. The patient does the finances without difficulty, and she is able to keep up with her medications and appointments. The patient does misplace things about the house recently. The patient has undergone MRI evaluation of the brain, and in the past, the patient has had ventriculomegaly, left lateral ventricle larger than the right. The patient has not reported any problems with bladder control or balance issues. The patient denies any numbness or weakness of the face, arms, or legs. The most recent MRI of the brain done on 12/08/2011 once again shows the ventriculomegaly and enlargement of the third ventricle, similar to  what was seen previously. The patient is sent to this office for an evaluation. Vitamin B12 levels and TSH were normal.  REVIEW OF SYSTEMS: Full 14 system review of systems performed and notable only for those listed, all others are neg:  Constitutional: N/A  Cardiovascular: N/A  Ear/Nose/Throat: N/A  Skin: N/A  Eyes: N/A  Respiratory: N/A  Gastroitestinal: N/A  Hematology/Lymphatic: N/A  Endocrine: Intolerance to cold  Musculoskeletal:N/A  Allergy/Immunology: N/A  Neurological: Memory loss  Psychiatric: anxiety  ALLERGIES: Allergies  Allergen Reactions  . Plaquenil [Hydroxychloroquine Sulfate]     HOME MEDICATIONS: Outpatient Prescriptions Prior to Visit  Medication Sig Dispense Refill  . amLODipine (NORVASC) 2.5 MG tablet Take 1 tablet (2.5 mg total) by mouth daily.  30 tablet  11  . aspirin 81 MG tablet Take 81 mg by mouth daily.      Marland Kitchen azithromycin (ZITHROMAX) 250 MG tablet Take 2 tablets today, then 1 tablet daily x 4 days  6 tablet  0  . cyclobenzaprine (FLEXERIL) 5 MG tablet Take 2.5 mg by mouth at bedtime.      Marland Kitchen ESTRACE VAGINAL 0.1 MG/GM vaginal cream APPLY 1 GRAM INTRAVAGINALLY 2 TIMES WEEKLY  127.5 g  2  . estradiol (ESTRACE) 0.5 MG tablet Take 1 tablet (0.5 mg total) by mouth daily.  90 tablet  3  . famotidine (PEPCID) 20 MG tablet TAKE 1 TABLET (20 MG TOTAL) BY MOUTH 2 (TWO) TIMES DAILY AS NEEDED FOR HEARTBURN.  60 tablet  1  . Ferrous Sulfate (IRON) 325 (65 FE) MG TABS Take  by mouth daily.      . fish oil-omega-3 fatty acids 1000 MG capsule Take 1 g by mouth daily.      Marland Kitchen levETIRAcetam (KEPPRA) 250 MG tablet TAKE 1 TABLET BY MOUTH TWICE A DAY  60 tablet  6  . losartan (COZAAR) 100 MG tablet Take 50 mg by mouth daily.      Marland Kitchen LUMIGAN 0.01 % SOLN Place 1 drop into both eyes at bedtime.       . medroxyPROGESTERone (PROVERA) 2.5 MG tablet TAKE 1 TABLET BY MOUTH EVERY DAY  90 tablet  3  . Multiple Vitamins-Calcium (ONE-A-DAY WOMENS PO) Take by mouth daily.      .  Multiple Vitamins-Minerals (ICAPS MV) TABS Take 1 each by mouth 2 (two) times daily.      Marland Kitchen PATADAY 0.2 % SOLN Place into both eyes daily as needed.      . Probiotic Product (ALIGN) 4 MG CAPS Take by mouth every morning.      . vitamin C (ASCORBIC ACID) 500 MG tablet Take 500 mg by mouth daily.      . Vitamin D, Ergocalciferol, (DRISDOL) 50000 UNITS CAPS capsule Take 1 capsule (50,000 Units total) by mouth every 21 ( twenty-one) days.  30 capsule  2  . fluticasone (FLONASE) 50 MCG/ACT nasal spray Place 1 spray into the nose daily.  16 g  2   No facility-administered medications prior to visit.    PAST MEDICAL HISTORY: Past Medical History  Diagnosis Date  . Dermatomyositis     remission on pred until 2008, rheum at [facility]  . Glaucoma   . GERD (gastroesophageal reflux disease)     agravated with Fosamax  . Status post dilation of esophageal narrowing   . Abnormal EEG 01/14/12    started on Keppra  . Hypertension 2013  . Vitamin D deficiency disease   . Urinary incontinence, mixed 07/01/11    Fitted for 2.75 " Ring Pessary with support  . PMB (postmenopausal bleeding) 08/2003  . Memory loss     PAST SURGICAL HISTORY: Past Surgical History  Procedure Laterality Date  . No past surgeries    . Colonoscopy  09/25/04    Dr. Loreta Ave  . Esophagogastroduodenoscopy endoscopy  04/29/04    anemia without cause  . Colonoscopy  02/2009  . Endometrial biopsy  08/24/03    weak proliferative endo, SHGM neg.    FAMILY HISTORY: Family History  Problem Relation Age of Onset  . Dementia Father     died age 90, otherwise healthy  . Heart disease Maternal Grandfather   . Heart disease Maternal Grandmother     SOCIAL HISTORY: History   Social History  . Marital Status: Married    Spouse Name: [name]    Number of Children: 1  . Years of Education: hs   Occupational History  . housewife    Social History Main Topics  . Smoking status: Former Smoker -- 1.00 packs/day for 15 years     Types: Cigarettes    Quit date: 02/04/1976  . Smokeless tobacco: Never Used  . Alcohol Use: 4.2 oz/week    7 Glasses of wine per week     Comment: 1 glass of wine per day  . Drug Use: No  . Sexual Activity: Yes    Partners: Male    Birth Control/ Protection: Post-menopausal   Other Topics Concern  . Not on file   Social History Narrative   Married, lives with spouse [name]).  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   Patient has one child.   Patient is right-handed.   Patient drinks 2-3 cups of coffee daily and maybe half cup at night.                    PHYSICAL EXAM  Filed Vitals:   01/28/13 1428  BP: 118/73  Pulse: 68  Height: 5' 1.75" (1.568 m)  Weight: 99 lb (44.906 kg)   Body mass index is 18.26 kg/(m^2).  Generalized: Well developed, in no acute distress  Head: normocephalic and atraumatic,. Oropharynx benign  Neck: Supple, no carotid bruits  Cardiac: Regular rate rhythm, no murmur  Musculoskeletal: No deformity   Neurological examination   Mentation: Alert oriented to time, place, history taking. MMSE 29/30. AFT 14. Follows all commands speech and language fluent  Cranial nerve II-XII: .Pupils were equal round reactive to light extraocular movements were full, visual field were full on confrontational test. Facial sensation and strength were normal. hearing was intact to finger rubbing bilaterally. Uvula tongue midline. head turning and shoulder shrug were normal and symmetric.Tongue protrusion into cheek strength was normal. Motor: normal bulk and tone, full strength in the BUE, BLE, fine finger movements normal, no pronator drift. No focal weakness Sensory: normal and symmetric to light touch, pinprick, and  vibration  Coordination: finger-nose-finger, heel-to-shin bilaterally, no dysmetria Reflexes: Brachioradialis 2/2, biceps 2/2, triceps 2/2, patellar 2/2, Achilles 2/2, plantar responses  were flexor bilaterally. Gait and Station: Rising up from seated position without assistance, normal stance,  moderate stride, good arm swing, smooth turning, able to perform tiptoe, and heel walking without difficulty. Tandem gait is steady  DIAGNOSTIC DATA (LABS, IMAGING, TESTING) - I reviewed patient records, labs, notes, testing and imaging myself where available.      Component Value Date/Time   NA 138 08/24/2012 1126   NA 136* 04/17/2011   K 4.1 08/24/2012 1126   CL 103 08/24/2012 1126   CO2 28 08/24/2012 1126   GLUCOSE 82 08/24/2012 1126   BUN 14 08/24/2012 1126   BUN 12 04/17/2011   CREATININE 0.7 08/24/2012 1126   CREATININE 0.7 04/17/2011   CALCIUM 9.8 08/24/2012 1126   PROT 7.7 12/08/2011 1441   ALBUMIN 4.0 12/08/2011 1441   AST 33 12/08/2011 1441   ALT 24 12/08/2011 1441   ALKPHOS 57 12/08/2011 1441   BILITOT 0.5 12/08/2011 1441   Lab Results  Component Value Date   CHOL 217* 08/24/2012   HDL 66.60 08/24/2012   LDLDIRECT 129.4 08/24/2012   TRIG 137.0 08/24/2012   CHOLHDL 3 08/24/2012    ASSESSMENT AND PLAN  67 y.o. year old female  has a past medical history of memory disturbance which is improved with Keppra. Ventriculomegaly stable.  Memory score is stable  Continue Keppra at current dose Will refill F/U in 6 months Check follow up MRI at next visit Nilda Riggs, [facility], [facility], APRN  [facility] [address], [address] [place], [place] [address] [phone]

## 2013-01-28 NOTE — Patient Instructions (Signed)
Continue Keppra at current dose Will refill Memory is stable F/U in 6 months

## 2013-02-28 ENCOUNTER — Other Ambulatory Visit: Payer: Self-pay | Admitting: Nurse Practitioner

## 2013-03-02 ENCOUNTER — Other Ambulatory Visit: Payer: Self-pay | Admitting: Nurse Practitioner

## 2013-03-03 ENCOUNTER — Other Ambulatory Visit: Payer: Self-pay | Admitting: Nurse Practitioner

## 2013-03-03 NOTE — Telephone Encounter (Signed)
Last AEX and refill 11/09/2012 #90/3 refills Next appt 11/10/2013.  Denied. Pt should have refilld to last 1 year until 11/10/2013. Called [facility] but no answer. Was on hold for a while.

## 2013-03-21 ENCOUNTER — Other Ambulatory Visit: Payer: Self-pay | Admitting: Internal Medicine

## 2013-04-04 ENCOUNTER — Ambulatory Visit (INDEPENDENT_AMBULATORY_CARE_PROVIDER_SITE_OTHER): Payer: Medicare Other | Admitting: Family Medicine

## 2013-04-04 ENCOUNTER — Encounter: Payer: Self-pay | Admitting: Family Medicine

## 2013-04-04 VITALS — BP 120/84 | Temp 97.9°F | Wt 102.0 lb

## 2013-04-04 DIAGNOSIS — L309 Dermatitis, unspecified: Secondary | ICD-10-CM

## 2013-04-04 DIAGNOSIS — L259 Unspecified contact dermatitis, unspecified cause: Secondary | ICD-10-CM

## 2013-04-04 MED ORDER — TRIAMCINOLONE ACETONIDE 0.1 % EX CREA: 1.0000 "application " | TOPICAL_CREAM | Freq: Two times a day (BID) | CUTANEOUS | Status: DC

## 2013-04-04 NOTE — Progress Notes (Signed)
Chief Complaint  Patient presents with  . Rash    HPI:  68 yo patient of Dr. Felicity CoyerLeschber here for acute Acute visit for Rash: -has had dry itchy skin on arms, legs and chest/neck for a month -this rash is itchy and burning when irritated -has tried calamine on this and suave lotion - body lotion -hx of dry itchy skin in the winter chronically -hx of dermatomyositis - did have some skin issues but this is different  and muscle issues (no muscle issues currently) -denies: malaise, fevers, muscle issues, weight loss  ROS: See pertinent positives and negatives per HPI.  Past Medical History  Diagnosis Date  . Dermatomyositis     remission on pred until 2008, [facility]  . Glaucoma   . GERD (gastroesophageal reflux disease)     agravated with Fosamax  . Status post dilation of esophageal narrowing   . Abnormal EEG 01/14/12    started on Keppra  . Hypertension 2013  . Vitamin D deficiency disease   . Urinary incontinence, mixed 07/01/11    Fitted for 2.75 " Ring Pessary with support  . PMB (postmenopausal bleeding) 08/2003  . Memory loss     Past Surgical History  Procedure Laterality Date  . No past surgeries    . Colonoscopy  09/25/04    Dr. Loreta AveMann  . Esophagogastroduodenoscopy endoscopy  04/29/04    anemia without cause  . Colonoscopy  02/2009  . Endometrial biopsy  08/24/03    weak proliferative endo, SHGM neg.    Family History  Problem Relation Age of Onset  . Dementia Father     died age 70, otherwise healthy  . Heart disease Maternal Grandfather   . Heart disease Maternal Grandmother     History   Social History  . Marital Status: Married    Spouse Name: [name]    Number of Children: 1  . Years of Education: hs   Occupational History  . housewife    Social History Main Topics  . Smoking status: Former Smoker -- 1.00 packs/day for 15 years    Types: Cigarettes    Quit date: 02/04/1976  . Smokeless tobacco: Never Used  . Alcohol Use: 4.2 oz/week     7 Glasses of wine per week     Comment: 1 glass of wine per day  . Drug Use: No  . Sexual Activity: Yes    Partners: Male    Birth Control/ Protection: Post-menopausal   Other Topics Concern  . None   Social History Narrative   Married, lives with spouse [name](James).   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   Patient has one child.   Patient is right-handed.   Patient drinks 2-3 cups of coffee daily and maybe half cup at night.                   Current outpatient prescriptions:amLODipine (NORVASC) 2.5 MG tablet, TAKE 1 TABLET (2.5 MG TOTAL) BY MOUTH DAILY., Disp: 30 tablet, Rfl: 5;  aspirin 81 MG tablet, Take 81 mg by mouth daily., Disp: , Rfl: ;  cyclobenzaprine (FLEXERIL) 5 MG tablet, Take 2.5 mg by mouth at bedtime., Disp: , Rfl: ;  ESTRACE VAGINAL 0.1 MG/GM vaginal cream, APPLY 1 GRAM INTRAVAGINALLY 2 TIMES WEEKLY, Disp: 127.5 g, Rfl: 2 estradiol (ESTRACE) 0.5 MG tablet, Take 1 tablet (0.5 mg total) by mouth daily., Disp: 90 tablet, Rfl: 3;  Ferrous Sulfate (IRON) 325 (65 FE) MG TABS, Take by mouth daily., Disp: , Rfl: ;  fish oil-omega-3 fatty acids 1000 MG capsule, Take 1 g by mouth daily., Disp: , Rfl: ;  levETIRAcetam (KEPPRA) 250 MG tablet, Take 1 tablet (250 mg total) by mouth 2 (two) times daily., Disp: 60 tablet, Rfl: 6 losartan (COZAAR) 100 MG tablet, Take 50 mg by mouth daily., Disp: , Rfl: ;  LUMIGAN 0.01 % SOLN, Place 1 drop into both eyes at bedtime. , Disp: , Rfl: ;  medroxyPROGESTERone (PROVERA) 2.5 MG tablet, TAKE 1 TABLET BY MOUTH EVERY DAY, Disp: 90 tablet, Rfl: 3;  Multiple Vitamins-Calcium (ONE-A-DAY WOMENS PO), Take by mouth daily., Disp: , Rfl: ;  Multiple Vitamins-Minerals (ICAPS MV) TABS, Take 1 each by mouth 2 (two) times daily., Disp: , Rfl:  PATADAY 0.2 % SOLN, Place into both eyes daily as needed., Disp: , Rfl: ;  Probiotic Product (ALIGN) 4 MG CAPS, Take by mouth every morning., Disp: , Rfl: ;   vitamin C (ASCORBIC ACID) 500 MG tablet, Take 500 mg by mouth daily., Disp: , Rfl: ;  Vitamin D, Ergocalciferol, (DRISDOL) 50000 UNITS CAPS capsule, Take 1 capsule (50,000 Units total) by mouth every 21 ( twenty-one) days., Disp: 30 capsule, Rfl: 2 triamcinolone cream (KENALOG) 0.1 %, Apply 1 application topically 2 (two) times daily., Disp: 30 g, Rfl: 0  EXAM:  Filed Vitals:   04/04/13 1446  BP: 120/84  Temp: 97.9 F (36.6 C)    Body mass index is 18.82 kg/(m^2).  GENERAL: vitals reviewed and listed above, alert, oriented, appears well hydrated and in no acute distress  HEENT: atraumatic, conjunttiva clear, no obvious abnormalities on inspection of external nose and ears  NECK: no obvious masses on inspection  SKIN: Xerosis of skin with few scattered excoriation   MS: moves all extremities without noticeable abnormality  PSYCH: pleasant and cooperative, no obvious depression or anxiety  ASSESSMENT AND PLAN:  Discussed the following assessment and plan:  Dermatitis - Plan: triamcinolone cream (KENALOG) 0.1 %  -suspect eczema and advised per recs and orders, however with hx of dermatomyositis will need to follow up with PCP if worsens or not resolving (doe snot have appearance of this today -Patient advised to return or notify a doctor immediately if symptoms worsen or persist or new concerns arise.  Patient Instructions  -hypoallergenic regimen  -humidifier  -cerave cream or cetaphil restoraderm daily  -steroid cream 1-2 times daily for 1-2 weeks  -follow up with your doctor in 3-4 weeks or sooner if needed     KIM, Dahlia Client R.

## 2013-04-04 NOTE — Progress Notes (Signed)
Pre visit review using our clinic review tool, if applicable. No additional management support is needed unless otherwise documented below in the visit note. 

## 2013-04-04 NOTE — Patient Instructions (Signed)
-  hypoallergenic regimen  -humidifier  -cerave cream or cetaphil restoraderm daily  -steroid cream 1-2 times daily for 1-2 weeks  -follow up with your doctor in 3-4 weeks or sooner if needed

## 2013-07-29 ENCOUNTER — Ambulatory Visit (INDEPENDENT_AMBULATORY_CARE_PROVIDER_SITE_OTHER): Payer: Medicare Other | Admitting: Neurology

## 2013-07-29 ENCOUNTER — Encounter: Payer: Self-pay | Admitting: Neurology

## 2013-07-29 ENCOUNTER — Encounter (INDEPENDENT_AMBULATORY_CARE_PROVIDER_SITE_OTHER): Payer: Self-pay

## 2013-07-29 VITALS — BP 149/78 | HR 104 | Wt 102.0 lb

## 2013-07-29 DIAGNOSIS — G40909 Epilepsy, unspecified, not intractable, without status epilepticus: Secondary | ICD-10-CM

## 2013-07-29 MED ORDER — LEVETIRACETAM 250 MG PO TABS: 250.0000 mg | ORAL_TABLET | Freq: Two times a day (BID) | ORAL | Status: DC

## 2013-07-29 NOTE — Patient Instructions (Signed)

## 2013-07-29 NOTE — Progress Notes (Signed)
Reason for visit: Seizures  April Chung is an 68 y.o. female  History of present illness:  April Chung is a [age] year old right-handed white female with a history of a seizure disorder and a mild memory disturbance. The patient has done relatively well with her seizures, she has had no episodes in greater than one year. The patient is operating motor vehicle, doing well with this. She is on low-dose Keppra taking 250 mg twice daily. She denies any side effects on the medication. The memory remains a mild issues, not truly progressive since last seen. She reports no other new medical issues that have come up since last seen.  Past Medical History  Diagnosis Date  . Dermatomyositis     remission on pred until 2008, [facility]  . Glaucoma   . GERD (gastroesophageal reflux disease)     agravated with Fosamax  . Status post dilation of esophageal narrowing   . Abnormal EEG 01/14/12    started on Keppra  . Hypertension 2013  . Vitamin D deficiency disease   . Urinary incontinence, mixed 07/01/11    Fitted for 2.75 " Ring Pessary with support  . PMB (postmenopausal bleeding) 08/2003  . Memory loss   . Seizures     Past Surgical History  Procedure Laterality Date  . No past surgeries    . Colonoscopy  09/25/04    Dr. Loreta AveMann  . Esophagogastroduodenoscopy endoscopy  04/29/04    anemia without cause  . Colonoscopy  02/2009  . Endometrial biopsy  08/24/03    weak proliferative endo, [facility] neg.    Family History  Problem Relation Age of Onset  . Dementia Father     died age 70, otherwise healthy  . Heart disease Maternal Grandfather   . Heart disease Maternal Grandmother   . Hypertension Mother   . Seizures Neg Hx     Social history:  reports that she quit smoking about 37 years ago. Her smoking use included Cigarettes. She has a 15 pack-year smoking history. She has never used smokeless tobacco. She reports that she drinks about 4.2 ounces of alcohol per week. She reports  that she does not use illicit drugs.    Allergies  Allergen Reactions  . Plaquenil [Hydroxychloroquine Sulfate]     Medications:  Current Outpatient Prescriptions on File Prior to Visit  Medication Sig Dispense Refill  . amLODipine (NORVASC) 2.5 MG tablet TAKE 1 TABLET (2.5 MG TOTAL) BY MOUTH DAILY.  30 tablet  5  . aspirin 81 MG tablet Take 81 mg by mouth daily.      Marland Kitchen. ESTRACE VAGINAL 0.1 MG/GM vaginal cream APPLY 1 GRAM INTRAVAGINALLY 2 TIMES WEEKLY  127.5 g  2  . estradiol (ESTRACE) 0.5 MG tablet Take 1 tablet (0.5 mg total) by mouth daily.  90 tablet  3  . Ferrous Sulfate (IRON) 325 (65 FE) MG TABS Take by mouth daily.      . fish oil-omega-3 fatty acids 1000 MG capsule Take 1 g by mouth daily.      Marland Kitchen. losartan (COZAAR) 100 MG tablet Take 50 mg by mouth daily.      Marland Kitchen. LUMIGAN 0.01 % SOLN Place 1 drop into both eyes at bedtime.       . medroxyPROGESTERone (PROVERA) 2.5 MG tablet TAKE 1 TABLET BY MOUTH EVERY DAY  90 tablet  3  . Multiple Vitamins-Calcium (ONE-A-DAY WOMENS PO) Take by mouth daily.      . Multiple Vitamins-Minerals (  ICAPS MV) TABS Take 1 each by mouth 2 (two) times daily.      Marland Kitchen. PATADAY 0.2 % SOLN Place into both eyes daily as needed.      . Probiotic Product (ALIGN) 4 MG CAPS Take by mouth every morning.      . triamcinolone cream (KENALOG) 0.1 % Apply 1 application topically 2 (two) times daily.  30 g  0  . vitamin C (ASCORBIC ACID) 500 MG tablet Take 500 mg by mouth daily.      . Vitamin D, Ergocalciferol, (DRISDOL) 50000 UNITS CAPS capsule Take 1 capsule (50,000 Units total) by mouth every 21 ( twenty-one) days.  30 capsule  2   No current facility-administered medications on file prior to visit.    ROS:  Out of a complete 14 system review of symptoms, the patient complains only of the following symptoms, and all other reviewed systems are negative.  Chills Eye redness Cold intolerance Bruising easily  Blood pressure 149/78, pulse 104, weight 102 lb (46.267  kg).  Physical Exam  General: The patient is alert and cooperative at the time of the examination.  Skin: No significant peripheral edema is noted.   Neurologic Exam  Mental status: The Mini-Mental status examination done today shows a total score 26/30.  Cranial nerves: Facial symmetry is present. Speech is normal, no aphasia or dysarthria is noted. Extraocular movements are full. Visual fields are full.  Motor: The patient has good strength in all 4 extremities.  Sensory examination: Soft touch sensation is symmetric on the face, arms, and legs. 6  Coordination: The patient has good finger-nose-finger and heel-to-shin bilaterally.  Gait and station: The patient has a normal gait. Tandem gait is normal. Romberg is negative. No drift is seen.  Reflexes: Deep tendon reflexes are symmetric.   Assessment/Plan:  1. History seizures  2. Mild memory disturbance  The patient will continue on the Keppra for now. A prescription was written, and she will followup in one year. If the memory worsens over time, adding Namenda in the future may be indicated. The patient has dropped her score on the Mini-Mental status examination from 29-26 on this evaluation today.  Marlan Palau. Keith Willis MD 07/29/2013 12:04 PM  [facility] [address] [place], [place] [id number]  Phone [phone] Fax [phone]

## 2013-08-29 ENCOUNTER — Other Ambulatory Visit: Payer: Self-pay | Admitting: Internal Medicine

## 2013-08-30 ENCOUNTER — Other Ambulatory Visit: Payer: Self-pay | Admitting: Internal Medicine

## 2013-09-05 ENCOUNTER — Other Ambulatory Visit: Payer: Self-pay | Admitting: Internal Medicine

## 2013-09-27 LAB — HM MAMMOGRAPHY: HM Mammogram: NEGATIVE

## 2013-09-29 ENCOUNTER — Other Ambulatory Visit: Payer: Self-pay | Admitting: Internal Medicine

## 2013-09-29 ENCOUNTER — Encounter: Payer: Self-pay | Admitting: Internal Medicine

## 2013-11-09 ENCOUNTER — Other Ambulatory Visit: Payer: Self-pay | Admitting: Nurse Practitioner

## 2013-11-10 ENCOUNTER — Other Ambulatory Visit: Payer: Self-pay | Admitting: Nurse Practitioner

## 2013-11-10 ENCOUNTER — Ambulatory Visit: Payer: Medicare Other | Admitting: Nurse Practitioner

## 2013-11-10 NOTE — Telephone Encounter (Signed)
Last AEX: 11/09/12 Last refill:11/10/13 Current AEX:12/20/13  RX request already sent

## 2013-11-10 NOTE — Telephone Encounter (Signed)
Last AEX: 11/09/12 Last refill:11/09/12 #30 X 2 Current AEX:12/20/13 Last Vitamin D: 54  Please advise

## 2013-11-11 NOTE — Telephone Encounter (Signed)
Called pharmacy to confirm they received the rx on 11/09/13. Lynden AngCathy stated she has it.

## 2013-11-24 ENCOUNTER — Other Ambulatory Visit: Payer: Self-pay | Admitting: Internal Medicine

## 2013-11-25 ENCOUNTER — Other Ambulatory Visit: Payer: Self-pay | Admitting: Internal Medicine

## 2013-11-25 ENCOUNTER — Telehealth: Payer: Self-pay | Admitting: Internal Medicine

## 2013-11-25 MED ORDER — LOSARTAN POTASSIUM 100 MG PO TABS: 50.0000 mg | ORAL_TABLET | Freq: Every day | ORAL | Status: DC

## 2013-11-25 NOTE — Telephone Encounter (Signed)
Notified [facility].../lmb

## 2013-11-25 NOTE — Telephone Encounter (Signed)
Pt has appt 11/10 with Dr Felicity CoyerLeschber however she will be out of her Losartan Potassium 100 mg she has a few tablets remaining. Pt wants to know if MD will prescribe enough to get her to this appt. [facility].

## 2013-12-03 ENCOUNTER — Other Ambulatory Visit: Payer: Self-pay | Admitting: Nurse Practitioner

## 2013-12-05 ENCOUNTER — Encounter: Payer: Self-pay | Admitting: Neurology

## 2013-12-05 NOTE — Telephone Encounter (Signed)
Last refilled/AEX: 11/09/12 #90/3 rfs by Ms. Patty AEX Scheduled: 12/10/13 with Ms. Patty Last Mammogram: 09/27/13 Bi-Rads 1: Negative  Provera 2.5 mg #90/[facility] to [facility] until AEX.

## 2013-12-09 ENCOUNTER — Telehealth: Payer: Self-pay | Admitting: Internal Medicine

## 2013-12-09 DIAGNOSIS — R413 Other amnesia: Secondary | ICD-10-CM

## 2013-12-09 DIAGNOSIS — M3390 Dermatopolymyositis, unspecified, organ involvement unspecified: Secondary | ICD-10-CM

## 2013-12-09 DIAGNOSIS — G40909 Epilepsy, unspecified, not intractable, without status epilepticus: Secondary | ICD-10-CM

## 2013-12-09 NOTE — Telephone Encounter (Signed)
I received email from patient's spouse requesting referral for second opinion on treatment of patient's memory loss and condition - [facility] - pt spouse notified of same - no med changes recommended

## 2013-12-13 ENCOUNTER — Other Ambulatory Visit (INDEPENDENT_AMBULATORY_CARE_PROVIDER_SITE_OTHER): Payer: Medicare Other

## 2013-12-13 ENCOUNTER — Encounter: Payer: Self-pay | Admitting: Internal Medicine

## 2013-12-13 ENCOUNTER — Telehealth: Payer: Self-pay

## 2013-12-13 ENCOUNTER — Ambulatory Visit (INDEPENDENT_AMBULATORY_CARE_PROVIDER_SITE_OTHER): Payer: Medicare Other | Admitting: Internal Medicine

## 2013-12-13 VITALS — BP 120/70 | HR 58 | Temp 97.4°F | Ht 61.75 in | Wt 103.0 lb

## 2013-12-13 DIAGNOSIS — R5383 Other fatigue: Secondary | ICD-10-CM

## 2013-12-13 DIAGNOSIS — I10 Essential (primary) hypertension: Secondary | ICD-10-CM

## 2013-12-13 DIAGNOSIS — R413 Other amnesia: Secondary | ICD-10-CM

## 2013-12-13 DIAGNOSIS — Z23 Encounter for immunization: Secondary | ICD-10-CM

## 2013-12-13 DIAGNOSIS — Z0000 Encounter for general adult medical examination without abnormal findings: Secondary | ICD-10-CM

## 2013-12-13 LAB — CBC WITH DIFFERENTIAL/PLATELET
BASOS PCT: 0.2 % (ref 0.0–3.0)
Basophils Absolute: 0 10*3/uL (ref 0.0–0.1)
EOS ABS: 0 10*3/uL (ref 0.0–0.7)
EOS PCT: 0.2 % (ref 0.0–5.0)
HEMATOCRIT: 42.9 % (ref 36.0–46.0)
Hemoglobin: 14.5 g/dL (ref 12.0–15.0)
LYMPHS ABS: 1.3 10*3/uL (ref 0.7–4.0)
Lymphocytes Relative: 10.8 % — ABNORMAL LOW (ref 12.0–46.0)
MCHC: 33.8 g/dL (ref 30.0–36.0)
MCV: 95.2 fl (ref 78.0–100.0)
MONO ABS: 0.5 10*3/uL (ref 0.1–1.0)
Monocytes Relative: 4.3 % (ref 3.0–12.0)
Neutro Abs: 10.1 10*3/uL — ABNORMAL HIGH (ref 1.4–7.7)
Neutrophils Relative %: 84.5 % — ABNORMAL HIGH (ref 43.0–77.0)
Platelets: 305 10*3/uL (ref 150.0–400.0)
RBC: 4.51 Mil/uL (ref 3.87–5.11)
RDW: 12.5 % (ref 11.5–15.5)
WBC: 12 10*3/uL — AB (ref 4.0–10.5)

## 2013-12-13 LAB — URINALYSIS, ROUTINE W REFLEX MICROSCOPIC
Bilirubin Urine: NEGATIVE
Hgb urine dipstick: NEGATIVE
Ketones, ur: NEGATIVE
Leukocytes, UA: NEGATIVE
NITRITE: NEGATIVE
PH: 6.5 (ref 5.0–8.0)
RBC / HPF: NONE SEEN (ref 0–?)
Specific Gravity, Urine: 1.01 (ref 1.000–1.030)
TOTAL PROTEIN, URINE-UPE24: NEGATIVE
URINE GLUCOSE: NEGATIVE
Urobilinogen, UA: 0.2 (ref 0.0–1.0)

## 2013-12-13 LAB — HEPATIC FUNCTION PANEL
ALT: 20 U/L (ref 0–35)
AST: 29 U/L (ref 0–37)
Albumin: 3.9 g/dL (ref 3.5–5.2)
Alkaline Phosphatase: 51 U/L (ref 39–117)
Bilirubin, Direct: 0.1 mg/dL (ref 0.0–0.3)
TOTAL PROTEIN: 7.9 g/dL (ref 6.0–8.3)
Total Bilirubin: 0.7 mg/dL (ref 0.2–1.2)

## 2013-12-13 LAB — LIPID PANEL
CHOLESTEROL: 237 mg/dL — AB (ref 0–200)
HDL: 64.1 mg/dL (ref >39.00)
LDL Cholesterol: 144 mg/dL — ABNORMAL HIGH (ref 0–99)
NonHDL: 172.9
TRIGLYCERIDES: 143 mg/dL (ref 0.0–149.0)
Total CHOL/HDL Ratio: 4
VLDL: 28.6 mg/dL (ref 0.0–40.0)

## 2013-12-13 LAB — BASIC METABOLIC PANEL
BUN: 17 mg/dL (ref 6–23)
CALCIUM: 10 mg/dL (ref 8.4–10.5)
CO2: 27 meq/L (ref 19–32)
Chloride: 104 mEq/L (ref 96–112)
Creatinine, Ser: 0.6 mg/dL (ref 0.4–1.2)
GFR: 97.29 mL/min (ref >60.00)
GLUCOSE: 93 mg/dL (ref 70–99)
Potassium: 4.5 mEq/L (ref 3.5–5.1)
Sodium: 139 mEq/L (ref 135–145)

## 2013-12-13 LAB — TSH: TSH: 1.17 u[IU]/mL (ref 0.35–4.50)

## 2013-12-13 MED ORDER — LOSARTAN POTASSIUM 100 MG PO TABS: 50.0000 mg | ORAL_TABLET | Freq: Every day | ORAL | Status: DC

## 2013-12-13 NOTE — Assessment & Plan Note (Signed)
BP Readings from Last 3 Encounters:  12/13/13 120/70  07/29/13 149/78  04/04/13 120/84   started ARB for same 12/2011; FH HTN reviewed added low dose CCB 04/2012 for vasodilation to combat "cold fingers" The current medical regimen is effective;  continue present plan and medications. Check labs including lipids

## 2013-12-13 NOTE — Patient Instructions (Addendum)
It was good to see you today.  We have reviewed your prior records including labs and tests today  Prevnar pneumonia vaccine updated today  Other Health Maintenance reviewed - all recommended immunizations and age-appropriate screenings are up-to-date.  Test(s) ordered today. Your results will be released to [facility] (or called to you) after review, usually within 72hours after test completion. If any changes need to be made, you will be notified at that same time.  Medications reviewed and updated, no changes recommended at this time.  Please schedule followup in 12 months for annual exam and labs, call sooner if problems.  We will contact you about referral to memory clinic at [place] as discussed  Health Maintenance Adopting a healthy lifestyle and getting preventive care can go a long way to promote health and wellness. Talk with your health care provider about what schedule of regular examinations is right for you. This is a good chance for you to check in with your provider about disease prevention and staying healthy. In between checkups, there are plenty of things you can do on your own. Experts have done a lot of research about which lifestyle changes and preventive measures are most likely to keep you healthy. Ask your health care provider for more information. WEIGHT AND DIET  Eat a healthy diet  Be sure to include plenty of vegetables, fruits, low-fat dairy products, and lean protein.  Do not eat a lot of foods high in solid fats, added sugars, or salt.  Get regular exercise. This is one of the most important things you can do for your health.  Most adults should exercise for at least 150 minutes each week. The exercise should increase your heart rate and make you sweat (moderate-intensity exercise).  Most adults should also do strengthening exercises at least twice a week. This is in addition to the moderate-intensity exercise.  Maintain a healthy weight  Body mass  index (BMI) is a measurement that can be used to identify possible weight problems. It estimates body fat based on height and weight. Your health care provider can help determine your BMI and help you achieve or maintain a healthy weight.  For females 72 years of age and older:   A BMI below 18.5 is considered underweight.  A BMI of 18.5 to 24.9 is normal.  A BMI of 25 to 29.9 is considered overweight.  A BMI of 30 and above is considered obese.  Watch levels of cholesterol and blood lipids  You should start having your blood tested for lipids and cholesterol at 70 years of age, then have this test every 5 years.  You may need to have your cholesterol levels checked more often if:  Your lipid or cholesterol levels are high.  You are older than 70 years of age.  You are at high risk for heart disease.  CANCER SCREENING   Lung Cancer  Lung cancer screening is recommended for adults 52-52 years old who are at high risk for lung cancer because of a history of smoking.  A yearly low-dose CT scan of the lungs is recommended for people who:  Currently smoke.  Have quit within the past 15 years.  Have at least a 30-pack-year history of smoking. A pack year is smoking an average of one pack of cigarettes a day for 1 year.  Yearly screening should continue until it has been 15 years since you quit.  Yearly screening should stop if you develop a health problem that would prevent  you from having lung cancer treatment.  Breast Cancer  Practice breast self-awareness. This means understanding how your breasts normally appear and feel.  It also means doing regular breast self-exams. Let your health care provider know about any changes, no matter how small.  If you are in your 20s or 30s, you should have a clinical breast exam (CBE) by a health care provider every 1-3 years as part of a regular health exam.  If you are 56 or older, have a CBE every year. Also consider having a  breast X-ray (mammogram) every year.  If you have a family history of breast cancer, talk to your health care provider about genetic screening.  If you are at high risk for breast cancer, talk to your health care provider about having an MRI and a mammogram every year.  Breast cancer gene (BRCA) assessment is recommended for women who have family members with BRCA-related cancers. BRCA-related cancers include:  Breast.  Ovarian.  Tubal.  Peritoneal cancers.  Results of the assessment will determine the need for genetic counseling and BRCA1 and BRCA2 testing. Cervical Cancer Routine pelvic examinations to screen for cervical cancer are no longer recommended for nonpregnant women who are considered low risk for cancer of the pelvic organs (ovaries, uterus, and vagina) and who do not have symptoms. A pelvic examination may be necessary if you have symptoms including those associated with pelvic infections. Ask your health care provider if a screening pelvic exam is right for you.   The Pap test is the screening test for cervical cancer for women who are considered at risk.  If you had a hysterectomy for a problem that was not cancer or a condition that could lead to cancer, then you no longer need Pap tests.  If you are older than 65 years, and you have had normal Pap tests for the past 10 years, you no longer need to have Pap tests.  If you have had past treatment for cervical cancer or a condition that could lead to cancer, you need Pap tests and screening for cancer for at least 20 years after your treatment.  If you no longer get a Pap test, assess your risk factors if they change (such as having a new sexual partner). This can affect whether you should start being screened again.  Some women have medical problems that increase their chance of getting cervical cancer. If this is the case for you, your health care provider may recommend more frequent screening and Pap tests.  The  human papillomavirus (HPV) test is another test that may be used for cervical cancer screening. The HPV test looks for the virus that can cause cell changes in the cervix. The cells collected during the Pap test can be tested for HPV.  The HPV test can be used to screen women 26 years of age and older. Getting tested for HPV can extend the interval between normal Pap tests from three to five years.  An HPV test also should be used to screen women of any age who have unclear Pap test results.  After 70 years of age, women should have HPV testing as often as Pap tests.  Colorectal Cancer  This type of cancer can be detected and often prevented.  Routine colorectal cancer screening usually begins at 70 years of age and continues through 70 years of age.  Your health care provider may recommend screening at an earlier age if you have risk factors for colon cancer.  Your  health care provider may also recommend using home test kits to check for hidden blood in the stool.  A small camera at the end of a tube can be used to examine your colon directly (sigmoidoscopy or colonoscopy). This is done to check for the earliest forms of colorectal cancer.  Routine screening usually begins at age 69.  Direct examination of the colon should be repeated every 5-10 years through 70 years of age. However, you may need to be screened more often if early forms of precancerous polyps or small growths are found. Skin Cancer  Check your skin from head to toe regularly.  Tell your health care provider about any new moles or changes in moles, especially if there is a change in a mole's shape or color.  Also tell your health care provider if you have a mole that is larger than the size of a pencil eraser.  Always use sunscreen. Apply sunscreen liberally and repeatedly throughout the day.  Protect yourself by wearing long sleeves, pants, a wide-brimmed hat, and sunglasses whenever you are outside. HEART  DISEASE, DIABETES, AND HIGH BLOOD PRESSURE   Have your blood pressure checked at least every 1-2 years. High blood pressure causes heart disease and increases the risk of stroke.  If you are between 71 years and 86 years old, ask your health care provider if you should take aspirin to prevent strokes.  Have regular diabetes screenings. This involves taking a blood sample to check your fasting blood sugar level.  If you are at a normal weight and have a low risk for diabetes, have this test once every three years after 70 years of age.  If you are overweight and have a high risk for diabetes, consider being tested at a younger age or more often. PREVENTING INFECTION  Hepatitis B  If you have a higher risk for hepatitis B, you should be screened for this virus. You are considered at high risk for hepatitis B if:  You were born in a country where hepatitis B is common. Ask your health care provider which countries are considered high risk.  Your parents were born in a high-risk country, and you have not been immunized against hepatitis B (hepatitis B vaccine).  You have HIV or AIDS.  You use needles to inject street drugs.  You live with someone who has hepatitis B.  You have had sex with someone who has hepatitis B.  You get hemodialysis treatment.  You take certain medicines for conditions, including cancer, organ transplantation, and autoimmune conditions. Hepatitis C  Blood testing is recommended for:  Everyone born from [birth year] through 1967.  Anyone with known risk factors for hepatitis C. Sexually transmitted infections (STIs)  You should be screened for sexually transmitted infections (STIs) including gonorrhea and chlamydia if:  You are sexually active and are younger than 70 years of age.  You are older than 70 years of age and your health care provider tells you that you are at risk for this type of infection.  Your sexual activity has changed since you were last  screened and you are at an increased risk for chlamydia or gonorrhea. Ask your health care provider if you are at risk.  If you do not have HIV, but are at risk, it may be recommended that you take a prescription medicine daily to prevent HIV infection. This is called pre-exposure prophylaxis (PrEP). You are considered at risk if:  You are sexually active and do not regularly use condoms  or know the HIV status of your partner(s).  You take drugs by injection.  You are sexually active with a partner who has HIV. Talk with your health care provider about whether you are at high risk of being infected with HIV. If you choose to begin PrEP, you should first be tested for HIV. You should then be tested every 3 months for as long as you are taking PrEP.  PREGNANCY   If you are premenopausal and you may become pregnant, ask your health care provider about preconception counseling.  If you may become pregnant, take 400 to 800 micrograms (mcg) of folic acid every day.  If you want to prevent pregnancy, talk to your health care provider about birth control (contraception). OSTEOPOROSIS AND MENOPAUSE   Osteoporosis is a disease in which the bones lose minerals and strength with aging. This can result in serious bone fractures. Your risk for osteoporosis can be identified using a bone density scan.  If you are 69 years of age or older, or if you are at risk for osteoporosis and fractures, ask your health care provider if you should be screened.  Ask your health care provider whether you should take a calcium or vitamin D supplement to lower your risk for osteoporosis.  Menopause may have certain physical symptoms and risks.  Hormone replacement therapy may reduce some of these symptoms and risks. Talk to your health care provider about whether hormone replacement therapy is right for you.  HOME CARE INSTRUCTIONS   Schedule regular health, dental, and eye exams.  Stay current with your  immunizations.   Do not use any tobacco products including cigarettes, chewing tobacco, or electronic cigarettes.  If you are pregnant, do not drink alcohol.  If you are breastfeeding, limit how much and how often you drink alcohol.  Limit alcohol intake to no more than 1 drink per day for nonpregnant women. One drink equals 12 ounces of beer, 5 ounces of wine, or 1 ounces of hard liquor.  Do not use street drugs.  Do not share needles.  Ask your health care provider for help if you need support or information about quitting drugs.  Tell your health care provider if you often feel depressed.  Tell your health care provider if you have ever been abused or do not feel safe at home. Document Released: 08/05/2010 Document Revised: 06/06/2013 Document Reviewed: 12/22/2012 [facility] Patient Information 2015 [place], [place]. This information is not intended to replace advice given to you by your health care provider. Make sure you discuss any questions you have with your health care provider.

## 2013-12-13 NOTE — Progress Notes (Signed)
Subjective:    Patient ID: April Chung, female    DOB: 09/08/1945, 68 y.o.   MRN: [id number]  HPI   Here for medicare wellness  Diet: heart healthy  Physical activity: sedentary Depression/mood screen: negative Hearing: intact to whispered voice Visual acuity: grossly normal, performs annual eye exam  ADLs: capable Fall risk: none Home safety: good Cognitive evaluation: intact to orientation and naming; poor recall and repetition EOL planning: adv directives, full code/ I agree  I have personally reviewed and have noted 1. The patient's medical and social history 2. Their use of alcohol, tobacco or illicit drugs 3. Their current medications and supplements 4. The patient's functional ability including ADL's, fall risks, home safety risks and hearing or visual impairment. 5. Diet and physical activities 6. Evidence for depression or mood disorders  Also reviewed chronic medical issues and interval medical events  Past Medical History  Diagnosis Date  . Dermatomyositis     remission on pred until 2008, [facility]  . Glaucoma   . GERD (gastroesophageal reflux disease)     agravated with Fosamax  . Status post dilation of esophageal narrowing   . Abnormal EEG 01/14/12    started on Keppra  . Hypertension 2013  . Vitamin D deficiency disease   . Urinary incontinence, mixed 07/01/11    Fitted for 2.75 " Ring Pessary with support  . PMB (postmenopausal bleeding) 08/2003  . Memory loss   . Seizures    Family History  Problem Relation Age of Onset  . Dementia Father     died age 70, otherwise healthy  . Heart disease Maternal Grandfather   . Heart disease Maternal Grandmother   . Hypertension Mother   . Seizures Neg Hx    History  Substance Use Topics  . Smoking status: Former Smoker -- 1.00 packs/day for 15 years    Types: Cigarettes    Quit date: 02/04/1976  . Smokeless tobacco: Never Used  . Alcohol Use: 4.2 oz/week    7 Glasses of wine per week       Comment: 1 glass of wine per day    Review of Systems  Constitutional: Positive for fatigue. Negative for unexpected weight change.  Respiratory: Negative for cough, shortness of breath and wheezing.   Cardiovascular: Negative for chest pain, palpitations and leg swelling.  Gastrointestinal: Negative for nausea, abdominal pain and diarrhea.  Neurological: Negative for dizziness, seizures, facial asymmetry, weakness, light-headedness and headaches.       Progressive memory problems  Psychiatric/Behavioral: Negative for dysphoric mood. The patient is not nervous/anxious.   All other systems reviewed and are negative.      Objective:   Physical Exam  BP 120/70 mmHg  Pulse 58  Temp(Src) 97.4 F (36.3 C) (Oral)  Ht 5' 1.75" (1.568 m)  Wt 103 lb (46.72 kg)  BMI 19.00 kg/m2  SpO2 97% Wt Readings from Last 3 Encounters:  12/13/13 103 lb (46.72 kg)  07/29/13 102 lb (46.267 kg)  04/04/13 102 lb (46.267 kg)   Constitutional: She appears well-developed and well-nourished. No distress.  Neck: Normal range of motion. Neck supple. No JVD present. No thyromegaly present.  Cardiovascular: Normal rate, regular rhythm and normal heart sounds.  No murmur heard. No BLE edema. Pulmonary/Chest: Effort normal and breath sounds normal. No respiratory distress. She has no wheezes.  Psychiatric: She has a normal mood and affect. Her behavior is normal. Judgment and thought content normal.   Lab Results  Component  Value Date   WBC 9.6 12/08/2011   HGB 14.7 12/08/2011   HCT 43.8 12/08/2011   PLT 435.0* 12/08/2011   GLUCOSE 82 08/24/2012   CHOL 217* 08/24/2012   TRIG 137.0 08/24/2012   HDL 66.60 08/24/2012   LDLDIRECT 129.4 08/24/2012   ALT 24 12/08/2011   AST 33 12/08/2011   NA 138 08/24/2012   K 4.1 08/24/2012   CL 103 08/24/2012   CREATININE 0.7 08/24/2012   BUN 14 08/24/2012   CO2 28 08/24/2012   TSH 1.06 12/08/2011    Mr Brain Wo Contrast  12/23/2011   *RADIOLOGY REPORT*   Clinical Data: 68 year old female with memory loss.  Memory disturbance.  MRI HEAD WITHOUT CONTRAST  Technique:  Multiplanar, multiecho pulse sequences of the brain and surrounding structures were obtained according to standard protocol without intravenous contrast.  Comparison: Head CT without contrast 03/31/2006.  Findings: Chronic severe lateral and third ventriculomegaly.  This is not significantly changed since 2008.  As before, there is asymmetric enlargement of the left lateral ventricle, particulate at the superior left frontal gyrus which may be ex vacuo related, uncertain.  No areas of transependymal edema.  The fourth ventricle is normal.  The cerebral aqueduct appears within normal limits.  Stable cerebral volume. No restricted diffusion to suggest acute infarction.  No midline shift, mass effect, or evidence of mass lesion.  No acute intracranial hemorrhage identified.  Negative pituitary.  Negative cervicomedullary junction.  Negative for age visualized cervical spine. Major intracranial vascular flow voids are preserved.  Mild patchy periventricular and subcortical white matter T2 and FLAIR hyperintensity, greater along the left lateral ventricle.  No areas of cortical encephalomalacia.  Deep gray matter nuclei, brainstem and cerebellum within normal limits. Mesial temporal lobe structures appear symmetric and normal except for the prominence of the temporal horns.  Grossly normal visualized internal auditory structures.  Postoperative changes to the globes.  Visualized paranasal sinuses and mastoids are clear.  Negative scalp soft tissues.  Normal bone marrow signal.  IMPRESSION: 1.  Chronic severe lateral and third ventriculomegaly.  Etiology is unclear.  The left lateral ventricular findings might in part reflect ex vacuo change with apparent decreased left frontal lobe volume. 2.  Mild superimposed nonspecific cerebral white matter signal changes. 3. No acute intracranial abnormality.   Original  Report Authenticated By: Erskine Speed, M.D.       Assessment & Plan:   AWV/z00.00 - Today patient counseled on age appropriate routine health concerns for screening and prevention, each reviewed and up to date or declined. Immunizations reviewed and up to date or declined. Labs ordered and reviewed. Risk factors for depression reviewed and negative. Hearing function and visual acuity are intact. ADLs screened and addressed as needed. Functional ability and level of safety reviewed and appropriate. Education, counseling and referrals performed based on assessed risks today. Patient provided with a copy of personalized plan for preventive services.  Fatigue - nonspecific symptoms/exam - check screening labs  Problem List Items Addressed This Visit    Hypertension    BP Readings from Last 3 Encounters:  12/13/13 120/70  07/29/13 149/78  04/04/13 120/84   started ARB for same 12/2011; FH HTN reviewed added low dose CCB 04/2012 for vasodilation to combat "cold fingers" The current medical regimen is effective;  continue present plan and medications. Check labs including lipids    Relevant Medications      losartan (COZAAR) tablet   Other Relevant Orders      Lipid panel  Memory disturbance    S/p neuro eval 01/2012 - considering NPH as DDX but abnormal EEG over L temporal region prompting initiation of AED (keppra) - Symptoms seemed initially improved, but spouse and pt report continued distractibility and "hyper" thoughts/personality s/p battery testing for ADD with [facility] 02/09/12: low scores in areas consistent with ADD started adderall after discussion/clearing with neuro ( Dr Anne HahnWillis), [facility] not notable effect on memory per family continue follow up with neuro as ongoing, 2nd opinion being arranged at family request [facility]) Also note FH - dad with "severe" early onset dementia and pt/family understandably concerned re: same     Other Visit Diagnoses     Routine general medical examination at a health care facility    -  Primary    Other fatigue        Relevant Orders       Basic metabolic panel       CBC with Differential       Hepatic function panel       TSH       Urinalysis, Routine w reflex microscopic

## 2013-12-13 NOTE — Telephone Encounter (Signed)
[phone] Massie Kluverebbie Stevens [facility] is calling the office for an Authorization number.  Routed to MottJessica for review

## 2013-12-13 NOTE — Assessment & Plan Note (Signed)
S/p neuro eval 01/2012 - considering NPH as DDX but abnormal EEG over L temporal region prompting initiation of AED (keppra) - Symptoms seemed initially improved, but spouse and pt report continued distractibility and "hyper" thoughts/personality s/p battery testing for ADD with [facility] 02/09/12: low scores in areas consistent with ADD started adderall after discussion/clearing with neuro ( Dr Anne HahnWillis), [facility] on [facility] per family continue follow up with neuro as ongoing, 2nd opinion being arranged at family request [facility]) Also note FH - dad with "severe" early onset dementia and pt/family understandably concerned re: same

## 2013-12-13 NOTE — Progress Notes (Signed)
Pre visit review using our clinic review tool, if applicable. No additional management support is needed unless otherwise documented below in the visit note. 

## 2013-12-14 ENCOUNTER — Telehealth: Payer: Self-pay

## 2013-12-14 NOTE — Telephone Encounter (Signed)
Dr. Felicity CoyerLeschber received an email from pt husband. He stated that pt said that she refused the referral to memory clinic. Can you confirm if this referral is still in? It does not need to be cancelled. Needs to be scheduled through husband.

## 2013-12-16 NOTE — Telephone Encounter (Signed)
Referral coordinators confirmed that no one called pt.   Referral order is still in and will contact husband to schedule.   Sent email to pt husband with same information just provided.

## 2013-12-20 ENCOUNTER — Encounter: Payer: Self-pay | Admitting: Nurse Practitioner

## 2013-12-20 ENCOUNTER — Ambulatory Visit (INDEPENDENT_AMBULATORY_CARE_PROVIDER_SITE_OTHER): Payer: Medicare Other | Admitting: Nurse Practitioner

## 2013-12-20 VITALS — BP 118/66 | HR 64 | Ht 60.25 in | Wt 102.0 lb

## 2013-12-20 DIAGNOSIS — M8580 Other specified disorders of bone density and structure, unspecified site: Secondary | ICD-10-CM

## 2013-12-20 DIAGNOSIS — Z01419 Encounter for gynecological examination (general) (routine) without abnormal findings: Secondary | ICD-10-CM

## 2013-12-20 MED ORDER — ESTRADIOL 0.1 MG/GM VA CREA: TOPICAL_CREAM | VAGINAL | Status: DC

## 2013-12-20 MED ORDER — MEDROXYPROGESTERONE ACETATE 2.5 MG PO TABS: 2.5000 mg | ORAL_TABLET | Freq: Every day | ORAL | Status: DC

## 2013-12-20 MED ORDER — ESTRADIOL 0.5 MG PO TABS: 0.5000 mg | ORAL_TABLET | Freq: Every day | ORAL | Status: DC

## 2013-12-20 NOTE — Progress Notes (Signed)
Patient ID: April RainwaterMyra M Stringfield, female   DOB: 04/30/1945, 68 y.o.   MRN: [id number] 68 y.o. [id number] Married Caucasian Fe here for annual exam.  No new health problems since last year.  Mother age 70 and 'spunky'. Patient remains on 1/2 tablet of Estradiol daily and willing to try decrease even more.    Patient's last menstrual period was 08/03/1993.          Sexually active: yes  The current method of family planning is post menopausal status.  Exercising: yes Home exercise routine includes walking 1/2 hrs per day at least 3 times per week. Also uses hand weights. Smoker: Former  Health Maintenance: Pap: 08/14/08 WNL MMG: 09/27/13, BI-RADS 1, negative Colonoscopy: 03/28/10, rpt 5 years BMD: 05/12/08 T Score: spine -0.4; right femur neck -2.0; total -1.4  TDaP: 08/24/12 Shingle vaccine: 2013 Prevnar 13:  12/13/13 Flu Shot:  11/17/13 Labs:  Dr. Felicity CoyerLeschber in [facility]   reports that she quit smoking about 37 years ago. Her smoking use included Cigarettes. She has a 15 pack-year smoking history. She has never used smokeless tobacco. She reports that she drinks about 4.2 oz of alcohol per week. She reports that she does not use illicit drugs.  Past Medical History  Diagnosis Date  . Dermatomyositis     remission on pred until 2008, [facility]  . Glaucoma   . GERD (gastroesophageal reflux disease)     agravated with Fosamax  . Status post dilation of esophageal narrowing   . Abnormal EEG 01/14/12    started on Keppra  . Hypertension 2013  . Vitamin D deficiency disease   . Urinary incontinence, mixed 07/01/11    Fitted for 2.75 " Ring Pessary with support  . PMB (postmenopausal bleeding) 08/2003  . Memory loss   . Seizures     Past Surgical History  Procedure Laterality Date  . No past surgeries    . Colonoscopy  09/25/04    Dr. Loreta AveMann  . Esophagogastroduodenoscopy endoscopy  04/29/04    anemia without cause  . Colonoscopy  02/2009  . Endometrial biopsy  08/24/03    weak  proliferative endo, SHGM neg.    Current Outpatient Prescriptions  Medication Sig Dispense Refill  . amLODipine (NORVASC) 2.5 MG tablet TAKE 1 TABLET (2.5 MG TOTAL) BY MOUTH DAILY. 30 tablet 5  . aspirin 81 MG tablet Take 81 mg by mouth daily.    Marland Kitchen. estradiol (ESTRACE VAGINAL) 0.1 MG/GM vaginal cream APPLY 1 GRAM INTRAVAGINALLY 2 TIMES WEEKLY 127.5 g 3  . estradiol (ESTRACE) 0.5 MG tablet Take 1 tablet (0.5 mg total) by mouth daily. 90 tablet 3  . Ferrous Sulfate (IRON) 325 (65 FE) MG TABS Take by mouth daily.    . fish oil-omega-3 fatty acids 1000 MG capsule Take 1 g by mouth daily.    Marland Kitchen. levETIRAcetam (KEPPRA) 250 MG tablet Take 1 tablet (250 mg total) by mouth 2 (two) times daily. 60 tablet 6  . losartan (COZAAR) 100 MG tablet Take 0.5 tablets (50 mg total) by mouth daily. 45 tablet 3  . LUMIGAN 0.01 % SOLN Place 1 drop into both eyes at bedtime.     . medroxyPROGESTERone (PROVERA) 2.5 MG tablet Take 1 tablet (2.5 mg total) by mouth daily. 90 tablet 3  . Multiple Vitamins-Calcium (ONE-A-DAY WOMENS PO) Take by mouth daily.    . Multiple Vitamins-Minerals (ICAPS MV) TABS Take 1 each by mouth 2 (two) times daily.    Marland Kitchen. PATADAY 0.2 %  SOLN Place into both eyes daily as needed.    . Probiotic Product (ALIGN) 4 MG CAPS Take by mouth every morning.    . vitamin C (ASCORBIC ACID) 500 MG tablet Take 500 mg by mouth daily.    . Vitamin D, Ergocalciferol, (DRISDOL) 50000 UNITS CAPS capsule TAKE 1 CAPSULE BY MOUTH EVERY OTHER WEEK 12 capsule 0   No current facility-administered medications for this visit.    Family History  Problem Relation Age of Onset  . Dementia Father     died age 70, otherwise healthy  . Heart disease Maternal Grandfather   . Heart disease Maternal Grandmother   . Hypertension Mother   . Seizures Neg Hx     ROS:  Pertinent items are noted in HPI.  Otherwise, a comprehensive ROS was negative.  Exam:   BP 118/66 mmHg  Pulse 64  Ht 5' 0.25" (1.53 m)  Wt 102 lb (46.267  kg)  BMI 19.76 kg/m2  LMP 08/03/1993 Height: 5' 0.25" (153 cm)  Ht Readings from Last 3 Encounters:  12/20/13 5' 0.25" (1.53 m)  12/13/13 5' 1.75" (1.568 m)  01/28/13 5' 1.75" (1.568 m)    General appearance: alert, cooperative and appears stated age Head: Normocephalic, without obvious abnormality, atraumatic Neck: no adenopathy, supple, symmetrical, trachea midline and thyroid normal to inspection and palpation Lungs: clear to auscultation bilaterally Breasts: normal appearance, no masses or tenderness Heart: regular rate and rhythm Abdomen: soft, non-tender; no masses,  no organomegaly Extremities: extremities normal, atraumatic, no cyanosis or edema Skin: Skin color, texture, turgor normal. No rashes or lesions Lymph nodes: Cervical, supraclavicular, and axillary nodes normal. No abnormal inguinal nodes palpated Neurologic: Grossly normal   Pelvic: External genitalia:  no lesions              Urethra:  normal appearing urethra with no masses, tenderness or lesions              Bartholin's and Skene's: normal                 Vagina: normal appearing vagina with normal color and discharge, no lesions              Cervix: anteverted              Pap taken: No. Bimanual Exam:  Uterus:  normal size, contour, position, consistency, mobility, non-tender              Adnexa: no mass, fullness, tenderness               Rectovaginal: Confirms               Anus:  normal sphincter tone, no lesions  A:  Well Woman with normal exam  Postmenopausal on HRT and wants to continue History of mixed urinary incontinence with use of 2.75 " ring pessary with support since 07/01/2011- has not used for about a year Osteopenia off Actonel 05/2008 and managed by PCP (GI SE with Fosamax Vit D deficiency  HTN controlled History of Dermatomyositis and off Steroids for some time - [facility]   P:   Reviewed health and wellness pertinent  to exam  Pap smear not taken today  Mammogram is due 8/16  Refill on Estradiol and Provera for a year  Refill on Estrace vaginal cream for a year  Counseled with risk of DVT, CVA, cancer, etc.  Counseled on breast self exam, mammography screening, use and side effects of HRT return annually  or prn  An After Visit Summary was printed and given to the patient.

## 2013-12-20 NOTE — Patient Instructions (Signed)

## 2013-12-21 ENCOUNTER — Encounter: Payer: Self-pay | Admitting: Neurology

## 2013-12-22 NOTE — Progress Notes (Signed)
Encounter reviewed by Dr. Brook Silva.  

## 2013-12-27 ENCOUNTER — Encounter: Payer: Self-pay | Admitting: Neurology

## 2014-01-16 ENCOUNTER — Other Ambulatory Visit: Payer: Self-pay | Admitting: Nurse Practitioner

## 2014-01-16 NOTE — Telephone Encounter (Signed)
Medication refill request: Estrace 0.1mg  Last AEX:  11/09/12 Next AEX: 01/01/15 Last MMG (if hormonal medication request): 09/27/13 Bi-Rads Neg Refill authorized: until AEX

## 2014-01-17 NOTE — Telephone Encounter (Signed)
Patient had AEX 12/20/13.  I went back into her chart and opened it up and she was given a refill for a year at that time -does not need a new RX.

## 2014-03-02 ENCOUNTER — Telehealth: Payer: Self-pay

## 2014-03-02 NOTE — Telephone Encounter (Signed)
Received emails from spouse that wanted to know about the [facility] referral.   Confirmed that pt has an appt 05/01/17 at 10:30am with Dr. Charissa BashJulie Chung. Spouse sent email with same.

## 2014-03-27 ENCOUNTER — Other Ambulatory Visit: Payer: Self-pay | Admitting: Neurology

## 2014-03-28 ENCOUNTER — Other Ambulatory Visit: Payer: Self-pay | Admitting: Nurse Practitioner

## 2014-03-28 NOTE — Telephone Encounter (Signed)
Medication refill request: Vitamin D 50,00 ius Last AEX:  12/20/13 with PG  Next AEX: 01/01/15 with PG  Last Vitamin D Level checked: 11/09/12 at 54 Refill authorized: #12/2 rfs, please advise.

## 2014-05-05 ENCOUNTER — Other Ambulatory Visit: Payer: Self-pay | Admitting: Internal Medicine

## 2014-06-14 DIAGNOSIS — G3184 Mild cognitive impairment, so stated: Secondary | ICD-10-CM | POA: Insufficient documentation

## 2014-07-28 ENCOUNTER — Other Ambulatory Visit: Payer: Self-pay | Admitting: Neurology

## 2014-07-29 NOTE — Telephone Encounter (Signed)
Has appt scheduled.

## 2014-07-31 ENCOUNTER — Other Ambulatory Visit: Payer: Self-pay | Admitting: Neurology

## 2014-07-31 NOTE — Telephone Encounter (Signed)
Duplicate

## 2014-08-02 ENCOUNTER — Ambulatory Visit (INDEPENDENT_AMBULATORY_CARE_PROVIDER_SITE_OTHER): Payer: Medicare Other | Admitting: Adult Health

## 2014-08-02 ENCOUNTER — Encounter: Payer: Self-pay | Admitting: Adult Health

## 2014-08-02 VITALS — BP 135/72 | HR 49 | Ht 60.0 in | Wt 113.0 lb

## 2014-08-02 DIAGNOSIS — R413 Other amnesia: Secondary | ICD-10-CM | POA: Diagnosis not present

## 2014-08-02 DIAGNOSIS — R569 Unspecified convulsions: Secondary | ICD-10-CM

## 2014-08-02 NOTE — Progress Notes (Signed)
PATIENT: April Chung DOB: 04/13/1945  REASON FOR VISIT: follow up- seizures, mild memory disturbance HISTORY FROM: patient  HISTORY OF PRESENT ILLNESS: April Chung is a 69 year old female with a history of seizure disorder and memory disturbance. The patient returns today for follow-up. The patient is currently taking Keppra 250 mg twice a day. She is tolerating this medication well. The patient denies any seizure episodes. She is able to operate a motor vehicle without difficulty. She is able to complete all ADLs independently. The patient states that her memory has remained the same. Denies having to give up anything due to her memory. She states that she often makes lists in order not to forget anything. For example she always makes a grocery list. Denies any new medical issues. She returns today for an evaluation.  HISTORY 07/29/2013 (WILLIS): April Chung is a 69 year old right-handed white female with a history of a seizure disorder and a mild memory disturbance. The patient has done relatively well with her seizures, she has had no episodes in greater than one year. The patient is operating motor vehicle, doing well with this. She is on low-dose Keppra taking 250 mg twice daily. She denies any side effects on the medication. The memory remains a mild issues, not truly progressive since last seen. She reports no other new medical issues that have come up since last seen.  REVIEW OF SYSTEMS: Out of a complete 14 system review of symptoms, the patient complains only of the following symptoms, and all other reviewed systems are negative.  Eye redness, seizure  ALLERGIES: Allergies  Allergen Reactions  . Plaquenil [Hydroxychloroquine Sulfate]   . Hydroxychloroquine Rash    HOME MEDICATIONS: Outpatient Prescriptions Prior to Visit  Medication Sig Dispense Refill  . amLODipine (NORVASC) 2.5 MG tablet Take 1 tablet (2.5 mg total) by mouth daily. 90 tablet 3  . aspirin 81 MG tablet Take 81 mg  by mouth daily.    Marland Kitchen estradiol (ESTRACE VAGINAL) 0.1 MG/GM vaginal cream APPLY 1 GRAM INTRAVAGINALLY 2 TIMES WEEKLY 127.5 g 3  . estradiol (ESTRACE) 0.5 MG tablet Take 1 tablet (0.5 mg total) by mouth daily. 90 tablet 3  . Ferrous Sulfate (IRON) 325 (65 FE) MG TABS Take by mouth daily.    . fish oil-omega-3 fatty acids 1000 MG capsule Take 1 g by mouth daily.    Marland Kitchen levETIRAcetam (KEPPRA) 250 MG tablet TAKE 1 TABLET BY MOUTH TWICE A DAY 60 tablet 0  . losartan (COZAAR) 100 MG tablet Take 0.5 tablets (50 mg total) by mouth daily. 45 tablet 3  . LUMIGAN 0.01 % SOLN Place 1 drop into both eyes at bedtime.     . medroxyPROGESTERone (PROVERA) 2.5 MG tablet Take 1 tablet (2.5 mg total) by mouth daily. 90 tablet 3  . Multiple Vitamins-Calcium (ONE-A-DAY WOMENS PO) Take by mouth daily.    . Multiple Vitamins-Minerals (ICAPS MV) TABS Take 1 each by mouth 2 (two) times daily.    Marland Kitchen PATADAY 0.2 % SOLN Place into both eyes daily as needed.    . Probiotic Product (ALIGN) 4 MG CAPS Take by mouth every morning.    . vitamin C (ASCORBIC ACID) 500 MG tablet Take 500 mg by mouth daily.    . Vitamin D, Ergocalciferol, (DRISDOL) 50000 UNITS CAPS capsule TAKE 1 CAPSULE BY MOUTH EVERY OTHER WEEK 12 capsule 2   No facility-administered medications prior to visit.    PAST MEDICAL HISTORY: Past Medical History  Diagnosis Date  . Dermatomyositis  remission on pred until 2008, [facility]  . Glaucoma   . GERD (gastroesophageal reflux disease)     agravated with Fosamax  . Status post dilation of esophageal narrowing   . Abnormal EEG 01/14/12    started on Keppra  . Hypertension 2013  . Vitamin D deficiency disease   . Urinary incontinence, mixed 07/01/11    Fitted for 2.75 " Ring Pessary with support  . PMB (postmenopausal bleeding) 08/2003  . Memory loss   . Seizures     PAST SURGICAL HISTORY: Past Surgical History  Procedure Laterality Date  . No past surgeries    . Colonoscopy  09/25/04      Dr. Loreta AveMann  . Esophagogastroduodenoscopy endoscopy  04/29/04    anemia without cause  . Colonoscopy  02/2009  . Endometrial biopsy  08/24/03    weak proliferative endo, SHGM neg.    FAMILY HISTORY: Family History  Problem Relation Age of Onset  . Dementia Father     died age 71, otherwise healthy  . Heart disease Maternal Grandfather   . Heart disease Maternal Grandmother   . Hypertension Mother   . Seizures Neg Hx       PHYSICAL EXAM  Filed Vitals:   08/02/14 1129  BP: 135/72  Pulse: 49  Height: 5' (1.524 m)  Weight: 113 lb (51.256 kg)   Body mass index is 22.07 kg/(m^2).  Generalized: Well developed, in no acute distress   Neurological examination  Mentation: Alert oriented to time, place, history taking. Follows all commands speech and language fluent. MMSE 26/30 Cranial nerve II-XII: Pupils were equal round reactive to light. Extraocular movements were full, visual field were full on confrontational test. Facial sensation and strength were normal. Uvula tongue midline. Head turning and shoulder shrug  were normal and symmetric. Motor: The motor testing reveals 5 over 5 strength of all 4 extremities. Good symmetric motor tone is noted throughout.  Sensory: Sensory testing is intact to soft touch on all 4 extremities. No evidence of extinction is noted.  Coordination: Cerebellar testing reveals good finger-nose-finger and heel-to-shin bilaterally.  Gait and station: Gait is normal. Tandem gait is normal. Romberg is negative. No drift is seen.  Reflexes: Deep tendon reflexes are symmetric and normal bilaterally.    DIAGNOSTIC DATA (LABS, IMAGING, TESTING) - I reviewed patient records, labs, notes, testing and imaging myself where available.      ASSESSMENT AND PLAN 69 y.o. year old female  has a past medical history of Dermatomyositis; Glaucoma; GERD (gastroesophageal reflux disease); Status post dilation of esophageal narrowing; Abnormal EEG (01/14/12);  Hypertension (2013); Vitamin D deficiency disease; Urinary incontinence, mixed (07/01/11); PMB (postmenopausal bleeding) (08/2003); Memory loss; and Seizures. here with:  1. Seizures 2. Mild memory disturbance  Overall the patient is doing well. She will continue taking Keppra 250 mg twice a day. The patient's MMSE is 26/30 was previously 26/30. We will continue to monitor her memory. If her symptoms worsen or she develops new symptoms she she'll let us know. Otherwise she will follow-up in one year or sooner if needed.  Butch PennyMegan Hiroto Saltzman, MSN, NP-C 08/02/2014, 11:34 AM [facility] [address], [address] [place], [place] [address] [phone]  Note: This document was prepared with digital dictation and possible smart phrase technology. Any transcriptional errors that result from this process are unintentional.

## 2014-08-02 NOTE — Patient Instructions (Signed)
Continue Keppra 250 mg twice a day. Memory has remained stable. We will continue to monitor. If symptoms worsen or he develops new symptoms please let us know.

## 2014-08-02 NOTE — Progress Notes (Signed)
I reviewed note and agree with plan.   VIKRAM R. PENUMALLI, MD 08/02/2014, 4:25 PM Certified in Neurology, Neurophysiology and Neuroimaging  [facility] [address], [address] [place], [place] [address] [phone]  

## 2014-08-27 ENCOUNTER — Other Ambulatory Visit: Payer: Self-pay | Admitting: Neurology

## 2015-01-01 ENCOUNTER — Ambulatory Visit (INDEPENDENT_AMBULATORY_CARE_PROVIDER_SITE_OTHER): Payer: Medicare Other | Admitting: Nurse Practitioner

## 2015-01-01 ENCOUNTER — Encounter: Payer: Self-pay | Admitting: Nurse Practitioner

## 2015-01-01 VITALS — BP 132/94 | HR 64 | Ht 60.0 in | Wt 105.0 lb

## 2015-01-01 DIAGNOSIS — Z0000 Encounter for general adult medical examination without abnormal findings: Secondary | ICD-10-CM

## 2015-01-01 DIAGNOSIS — B029 Zoster without complications: Secondary | ICD-10-CM | POA: Diagnosis not present

## 2015-01-01 DIAGNOSIS — Z01419 Encounter for gynecological examination (general) (routine) without abnormal findings: Secondary | ICD-10-CM | POA: Diagnosis not present

## 2015-01-01 DIAGNOSIS — M8580 Other specified disorders of bone density and structure, unspecified site: Secondary | ICD-10-CM

## 2015-01-01 MED ORDER — ESTRADIOL 0.5 MG PO TABS: 0.5000 mg | ORAL_TABLET | Freq: Every day | ORAL | Status: DC

## 2015-01-01 MED ORDER — VALACYCLOVIR HCL 1 G PO TABS: 1000.0000 mg | ORAL_TABLET | Freq: Two times a day (BID) | ORAL | Status: DC

## 2015-01-01 MED ORDER — ESTRADIOL 0.1 MG/GM VA CREA: TOPICAL_CREAM | VAGINAL | Status: DC

## 2015-01-01 MED ORDER — MEDROXYPROGESTERONE ACETATE 2.5 MG PO TABS: 2.5000 mg | ORAL_TABLET | Freq: Every day | ORAL | Status: DC

## 2015-01-01 NOTE — Patient Instructions (Signed)

## 2015-01-01 NOTE — Progress Notes (Addendum)
Patient ID: April Chung, female   DOB: 1945-08-29, 69 y.o.   MRN: [id number] 69 y.o. [id number] Married  Caucasian Fe here for annual exam.  No new health concerns other than a rash on right torso for 2 weeks.    Itching but not painful.  No other symptoms. Still using pessary and able to remove and insert without problems.  She does find that some days if she is not as active she can leave out longer.  She does not need a replacement at this time.  Patient's last menstrual period was 08/03/1993 (approximate).          Sexually active: No.  The current method of family planning is post menopausal status.    Exercising: Yes.    walking everyday and push ups Smoker:  no  Health Maintenance: Pap: 08/14/08 WNL MMG: 09/2014 per patient, last result in chart 09/27/13, BI-RADS 1, negative Colonoscopy: 03/28/10, rpt 5 years BMD: 05/12/08, Low Bone Mass TDaP: 08/24/12 Labs: PCP   reports that she quit smoking about 38 years ago. Her smoking use included Cigarettes. She has a 15 pack-year smoking history. She has never used smokeless tobacco. She reports that she drinks about 4.2 oz of alcohol per week. She reports that she does not use illicit drugs.  Past Medical History  Diagnosis Date  . Dermatomyositis (HCC)     remission on pred until 2008, rheum at [facility]  . Glaucoma   . GERD (gastroesophageal reflux disease)     agravated with Fosamax  . Status post dilation of esophageal narrowing   . Abnormal EEG 01/14/12    started on Keppra  . Hypertension 2013  . Vitamin D deficiency disease   . Urinary incontinence, mixed 07/01/11    Fitted for 2.75 " Ring Pessary with support  . PMB (postmenopausal bleeding) 08/2003  . Memory loss   . Seizures [facility])     Past Surgical History  Procedure Laterality Date  . No past surgeries    . Colonoscopy  09/25/04    Dr. Loreta Ave  . Esophagogastroduodenoscopy endoscopy  04/29/04    anemia without cause  . Colonoscopy  02/2009  . Endometrial biopsy  08/24/03   weak proliferative endo, SHGM neg.    Current Outpatient Prescriptions  Medication Sig Dispense Refill  . amLODipine (NORVASC) 2.5 MG tablet Take 1 tablet (2.5 mg total) by mouth daily. 90 tablet 3  . aspirin 81 MG tablet Take 81 mg by mouth daily.    Marland Kitchen estradiol (ESTRACE VAGINAL) 0.1 MG/GM vaginal cream APPLY 1 GRAM INTRAVAGINALLY 2 TIMES WEEKLY 127.5 g 3  . estradiol (ESTRACE) 0.5 MG tablet Take 1 tablet (0.5 mg total) by mouth daily. 90 tablet 3  . Ferrous Sulfate (IRON) 325 (65 FE) MG TABS Take by mouth daily.    . fish oil-omega-3 fatty acids 1000 MG capsule Take 1 g by mouth daily.    Marland Kitchen levETIRAcetam (KEPPRA) 250 MG tablet TAKE 1 TABLET BY MOUTH TWICE A DAY 60 tablet 11  . losartan (COZAAR) 100 MG tablet Take 0.5 tablets (50 mg total) by mouth daily. 45 tablet 3  . LUMIGAN 0.01 % SOLN Place 1 drop into both eyes at bedtime.     . medroxyPROGESTERone (PROVERA) 2.5 MG tablet Take 1 tablet (2.5 mg total) by mouth daily. 90 tablet 3  . Multiple Vitamins-Calcium (ONE-A-DAY WOMENS PO) Take by mouth daily.    . Multiple Vitamins-Minerals (ICAPS MV) TABS Take 1 each by mouth 2 (two) times  daily.    Marland Kitchen PATADAY 0.2 % SOLN Place into both eyes daily as needed.    . Probiotic Product (ALIGN) 4 MG CAPS Take by mouth every morning.    . vitamin C (ASCORBIC ACID) 500 MG tablet Take 500 mg by mouth daily.    . Vitamin D, Ergocalciferol, (DRISDOL) 50000 UNITS CAPS capsule TAKE 1 CAPSULE BY MOUTH EVERY OTHER WEEK 12 capsule 2  . valACYclovir (VALTREX) 1000 MG tablet Take 1 tablet (1,000 mg total) by mouth 2 (two) times daily. Take for 10 days 20 tablet 0   No current facility-administered medications for this visit.    Family History  Problem Relation Age of Onset  . Dementia Father     died age 81, otherwise healthy  . Heart disease Maternal Grandfather   . Heart disease Maternal Grandmother   . Hypertension Mother   . Seizures Neg Hx     ROS:  Pertinent items are noted in HPI.  Otherwise, a  comprehensive ROS was negative.  Exam:   BP 132/94 mmHg  Pulse 64  Ht 5' (1.524 m)  Wt 105 lb (47.628 kg)  BMI 20.51 kg/m2  LMP 08/03/1993 (Approximate) Height: 5' (152.4 cm) Ht Readings from Last 3 Encounters:  01/01/15 5' (1.524 m)  08/02/14 5' (1.524 m)  12/20/13 5' 0.25" (1.53 m)    General appearance: alert, cooperative and appears stated age Head: Normocephalic, without obvious abnormality, atraumatic Neck: no adenopathy, supple, symmetrical, trachea midline and thyroid normal to inspection and palpation Lungs: clear to auscultation bilaterally Breasts: normal appearance, no masses or tenderness Heart: regular rate and rhythm Abdomen: soft, non-tender; no masses,  no organomegaly Extremities: extremities normal, atraumatic, no cyanosis or edema Skin: Skin color, texture, turgor normal.  Extensive rash with varying dried vesicular lesions that extend from the right back around to and across the right breast along the dermatone.  Does not cross midline at the spine or chest wall.  Areas are classic for shingles.  Some areas around the vesicles are red but seem to be non tender.  No pain at the right breast on exam. Lymph nodes: Cervical, supraclavicular, and axillary nodes normal. No abnormal inguinal nodes palpated Neurologic: Grossly normal   Pelvic: External genitalia:  no lesions              Urethra:  Some prolapse of the urethra without masses, tenderness or lesions              Bartholin's and Skene's: normal                 Vagina: normal appearing vagina with normal color and discharge, no lesions              Cervix: anteverted              Pap taken: Yes.   Bimanual Exam:  Uterus:  normal size, contour, position, consistency, mobility, non-tender              Adnexa: no mass, fullness, tenderness               Rectovaginal: Confirms               Anus:  normal sphincter tone, no lesions  Chaperone present: yes  A:  Well Woman with normal exam  Postmenopausal  on HRT and now willing to taper off History of mixed urinary incontinence with use of 2.75 " ring pessary with support since 07/01/2011  Urethral prolapse Osteopenia off  Actonel 05/2008 and managed by PCP (GI SE with Fosamax) Vit D deficiency, HTN History of Dermatomyositis and off Steroids for some time - followed [facility]  Shingles    P:   Reviewed health and wellness pertinent to exam  Pap smear as above  Mammogram is due 09/2015 - will get copy of this year for our chart  Order faxed for BMD, will follow with labs  Refill of HRT and plans to taper and be off med's by the spring  She is to continue with Estrace vaginal cream for pessary use and to apply a pea size amount to the urethra.  Counseled with risk of DVT, CVA, cancer, etc.  Rx for Valtrex 1000 mg BID #20  Counseled on breast self exam, mammography screening, use and side effects of HRT, adequate intake of calcium and vitamin D, diet and exercise return annually or prn  An After Visit Summary was printed and given to the patient.

## 2015-01-01 NOTE — Progress Notes (Signed)
Encounter reviewed by Dr. Brook Amundson C. Silva.  

## 2015-01-02 LAB — HEPATITIS C ANTIBODY: HCV Ab: NEGATIVE

## 2015-01-03 LAB — IPS PAP SMEAR ONLY

## 2015-01-30 ENCOUNTER — Other Ambulatory Visit: Payer: Self-pay

## 2015-01-30 ENCOUNTER — Other Ambulatory Visit: Payer: Self-pay | Admitting: Internal Medicine

## 2015-01-30 MED ORDER — LOSARTAN POTASSIUM 100 MG PO TABS: 50.0000 mg | ORAL_TABLET | Freq: Every day | ORAL | Status: DC

## 2015-03-02 ENCOUNTER — Other Ambulatory Visit: Payer: Self-pay | Admitting: Nurse Practitioner

## 2015-03-05 NOTE — Telephone Encounter (Signed)
Medication refill request: Estrace Last AEX:  01/01/2015 PG Next AEX: 01/08/2016 PG Last MMG (if hormonal medication request): 09/27/2013 BIRADS Category 1 Negative Refill authorized: 01/01/2015 #90 tabs 3 Refills  Refused Refill Request; too soon.  Should still have Refills

## 2015-03-07 ENCOUNTER — Other Ambulatory Visit: Payer: Self-pay | Admitting: Nurse Practitioner

## 2015-03-07 NOTE — Telephone Encounter (Signed)
Medication refill request: Estradiol Estrace Tablet Last AEX:  01/01/15 PG Next AEX: 01/08/16 PG Last MMG (if hormonal medication request): 09/27/13 BIRADS Category 1 Negative Refill authorized: 01/01/15 #90 tabs 3 Refills  Refused: Refill request too soon

## 2015-03-08 ENCOUNTER — Other Ambulatory Visit: Payer: Self-pay | Admitting: Nurse Practitioner

## 2015-03-08 NOTE — Telephone Encounter (Signed)
Called and spoke with April Chung at [facility] inquiring the reason for the continuous refill request for Estrace.  I informed her this was the 3rd time we received this request and there should be enough refills on file.  She stated that she can see there is enough and is unsure why it keeps sending out a request.  She is going to clear it out.

## 2015-03-16 ENCOUNTER — Telehealth: Payer: Self-pay | Admitting: Internal Medicine

## 2015-03-16 NOTE — Telephone Encounter (Signed)
Patient is advising that [facility]. All scripts were xferred except for amLODipine (NORVASC) 2.5 MG tablet. Patient is requesting that we [facility] to [facility],   i advised that we would need to put an appt on file for [place] to do a further refill to show that she is establishing with another provider. She refused, and stated that she would call [facility] back.

## 2015-03-17 ENCOUNTER — Ambulatory Visit: Payer: Self-pay | Admitting: Internal Medicine

## 2015-03-27 DIAGNOSIS — H20011 Primary iridocyclitis, right eye: Secondary | ICD-10-CM | POA: Diagnosis not present

## 2015-03-27 DIAGNOSIS — H401131 Primary open-angle glaucoma, bilateral, mild stage: Secondary | ICD-10-CM | POA: Diagnosis not present

## 2015-04-03 DIAGNOSIS — H4301 Vitreous prolapse, right eye: Secondary | ICD-10-CM | POA: Diagnosis not present

## 2015-04-09 ENCOUNTER — Encounter (INDEPENDENT_AMBULATORY_CARE_PROVIDER_SITE_OTHER): Payer: Medicare Other | Admitting: Ophthalmology

## 2015-04-09 ENCOUNTER — Other Ambulatory Visit: Payer: Self-pay | Admitting: Nurse Practitioner

## 2015-04-09 NOTE — Telephone Encounter (Signed)
Please see if PCP has checked her Vit D as the last one in Epic was 2014 and was OK.  If they are checking then they can give the RX.

## 2015-04-09 NOTE — Telephone Encounter (Signed)
eScribe request from [facility] for refill on VITAMIN D Last filled - 03/28/14, #12 X 2 REFILLS Last AEX - 01/01/15 Last Vitamin D - 11/09/12 at 54  Please advise refills.

## 2015-04-13 NOTE — Telephone Encounter (Signed)
Routed back to stephanie

## 2015-04-13 NOTE — Telephone Encounter (Signed)
Patient is checking on the status of her request for vit d and estradiol.

## 2015-04-13 NOTE — Telephone Encounter (Signed)
Return call to patient.  Message left advising patient I would return call again Monday with follow up questions regarding Vit D.  Advised pt she has refills at [facility] for Estradiol through November.

## 2015-04-16 NOTE — Telephone Encounter (Signed)
I spoke with the patient regarding Vitamin D prescription.  She does not think she has had Vit D checked with any other provider.  Advised at her last check, we would recommend switching to OTC supplement.   Pt is agreeable to OTC supplement.  Pt has had to change pharmacies due to insurance.  Advised she can call [facility] to have them request transfer of [facility].  Pt will do this.  Pt will call if any questions or if she needs additional help with medications.  RX for Vit D denied.

## 2015-04-16 NOTE — Telephone Encounter (Signed)
April Chung with [facility] calling to check the status of a refill request.

## 2015-05-23 ENCOUNTER — Encounter (INDEPENDENT_AMBULATORY_CARE_PROVIDER_SITE_OTHER): Payer: Medicare Other | Admitting: Ophthalmology

## 2015-05-29 ENCOUNTER — Other Ambulatory Visit: Payer: Self-pay | Admitting: *Deleted

## 2015-05-29 MED ORDER — AMLODIPINE BESYLATE 2.5 MG PO TABS: 2.5000 mg | ORAL_TABLET | Freq: Every day | ORAL | Status: DC

## 2015-05-29 NOTE — Telephone Encounter (Signed)
Left msg on triage requesting refill on pt amlodipine pt is wanting a #90 supply. Pt has appt w/Dr. Lawerance BachBurns 06/04/15 will send a 30 day until appt,,,./lmb

## 2015-06-06 ENCOUNTER — Ambulatory Visit: Payer: Self-pay | Admitting: Internal Medicine

## 2015-07-06 ENCOUNTER — Other Ambulatory Visit: Payer: Self-pay | Admitting: Internal Medicine

## 2015-07-16 ENCOUNTER — Other Ambulatory Visit: Payer: Self-pay | Admitting: Nurse Practitioner

## 2015-07-16 NOTE — Telephone Encounter (Signed)
Spoke with patient and her PCP usually writes this. She is going to have the pharmacy send this to her PCP. Please disregard refill request -eh

## 2015-07-16 NOTE — Telephone Encounter (Signed)
Please check with pt as to who gave RX - if we gave this med most likely would have been on med list.

## 2015-07-16 NOTE — Telephone Encounter (Signed)
Medication refill request: Celexa (not on current med list)  Last AEX:  01-01-15  Next AEX: 01-08-16 Last MMG (if hormonal medication request): 09-27-13 WNL  Refill authorized: please advise

## 2015-07-17 ENCOUNTER — Telehealth: Payer: Self-pay | Admitting: Emergency Medicine

## 2015-07-17 MED ORDER — CITALOPRAM HYDROBROMIDE 20 MG PO TABS: 20.0000 mg | ORAL_TABLET | Freq: Every day | ORAL | Status: DC

## 2015-07-17 NOTE — Telephone Encounter (Signed)
Ok to fill 

## 2015-07-17 NOTE — Telephone Encounter (Signed)
Received refill request from [facility] for Citalopram HBR 20 mg tab. I do not see on pts current or past med list. Called pt to verify that she was taking it. She states that she is taking it every day at bedtime. Please advise on refill. Pt will be in to est on 07/31/15

## 2015-07-31 ENCOUNTER — Ambulatory Visit: Payer: Self-pay | Admitting: Internal Medicine

## 2015-08-01 ENCOUNTER — Other Ambulatory Visit: Payer: Self-pay | Admitting: Internal Medicine

## 2015-08-02 ENCOUNTER — Encounter: Payer: Self-pay | Admitting: Adult Health

## 2015-08-02 ENCOUNTER — Ambulatory Visit (INDEPENDENT_AMBULATORY_CARE_PROVIDER_SITE_OTHER): Payer: Medicare Other | Admitting: Adult Health

## 2015-08-02 VITALS — BP 160/86 | HR 60 | Ht 60.0 in | Wt 105.0 lb

## 2015-08-02 DIAGNOSIS — R413 Other amnesia: Secondary | ICD-10-CM | POA: Diagnosis not present

## 2015-08-02 DIAGNOSIS — R569 Unspecified convulsions: Secondary | ICD-10-CM

## 2015-08-02 NOTE — Progress Notes (Signed)
I have read the note, and I agree with the clinical assessment and plan.  Nefi Musich KEITH   

## 2015-08-02 NOTE — Progress Notes (Signed)
PATIENT: Gabriel RainwaterMyra M Gulotta DOB: 04/30/1945  REASON FOR VISIT: follow up- seizures, memory HISTORY FROM: patient  HISTORY OF PRESENT ILLNESS: Ms. Katrinka BlazingSmith is a 70 year old female with a history of seizures and memory disturbance. She returns today for follow-up. She is currently taking Keppra 250 mg twice a day. She denies any seizure events. She reports that her memory has remained stable. She states that her husband actually feels that it may have improved some. She is able to complete all ADLs independently. She operates a Librarian, academicmotor vehicle without difficulty. She does not feel that she's had to give up anything due to her memory. She returns today for an evaluation.  HISTORY 08/02/14 (MM): Ms. Katrinka BlazingSmith is a 70 year old female with a history of seizure disorder and memory disturbance. The patient returns today for follow-up. The patient is currently taking Keppra 250 mg twice a day. She is tolerating this medication well. The patient denies any seizure episodes. She is able to operate a motor vehicle without difficulty. She is able to complete all ADLs independently. The patient states that her memory has remained the same. Denies having to give up anything due to her memory. She states that she often makes lists in order not to forget anything. For example she always makes a grocery list. Denies any new medical issues. She returns today for an evaluation.  HISTORY 07/29/2013 (WILLIS): Ms. Katrinka BlazingSmith is a 70 year old right-handed white female with a history of a seizure disorder and a mild memory disturbance. The patient has done relatively well with her seizures, she has had no episodes in greater than one year. The patient is operating motor vehicle, doing well with this. She is on low-dose Keppra taking 250 mg twice daily. She denies any side effects on the medication. The memory remains a mild issues, not truly progressive since last seen. She reports no other new medical issues that have come up since last seen.    REVIEW OF SYSTEMS: Out of a complete 14 system review of symptoms, the patient complains only of the following symptoms, and all other reviewed systems are negative.  See history of present illness  ALLERGIES: Allergies  Allergen Reactions  . Plaquenil [Hydroxychloroquine Sulfate]   . Hydroxychloroquine Rash    HOME MEDICATIONS: Outpatient Prescriptions Prior to Visit  Medication Sig Dispense Refill  . amLODipine (NORVASC) 2.5 MG tablet TAKE 1 TABLET BY MOUTH EVERY DAY 30 tablet 0  . aspirin 81 MG tablet Take 81 mg by mouth daily.    . citalopram (CELEXA) 20 MG tablet Take 1 tablet (20 mg total) by mouth daily. 30 tablet 1  . fish oil-omega-3 fatty acids 1000 MG capsule Take 1 g by mouth daily.    Marland Kitchen. levETIRAcetam (KEPPRA) 250 MG tablet TAKE 1 TABLET BY MOUTH TWICE A DAY 60 tablet 11  . losartan (COZAAR) 100 MG tablet Take 0.5 tablets (50 mg total) by mouth daily. 45 tablet 3  . LUMIGAN 0.01 % SOLN Place 1 drop into both eyes at bedtime.     . medroxyPROGESTERone (PROVERA) 2.5 MG tablet Take 1 tablet (2.5 mg total) by mouth daily. 90 tablet 3  . Multiple Vitamins-Calcium (ONE-A-DAY WOMENS PO) Take by mouth daily.    Marland Kitchen. PATADAY 0.2 % SOLN Place into both eyes daily as needed.    . Probiotic Product (ALIGN) 4 MG CAPS Take by mouth every morning. Reported on 08/02/2015    . Multiple Vitamins-Minerals (ICAPS MV) TABS Take 1 each by mouth 2 (two) times daily.    .Marland Kitchen  estradiol (ESTRACE VAGINAL) 0.1 MG/GM vaginal cream APPLY 1 GRAM INTRAVAGINALLY 2 TIMES WEEKLY (Patient not taking: Reported on 08/02/2015) 127.5 g 3  . estradiol (ESTRACE) 0.5 MG tablet Take 1 tablet (0.5 mg total) by mouth daily. (Patient not taking: Reported on 08/02/2015) 90 tablet 3  . Ferrous Sulfate (IRON) 325 (65 FE) MG TABS Take by mouth daily. Reported on 08/02/2015    . valACYclovir (VALTREX) 1000 MG tablet Take 1 tablet (1,000 mg total) by mouth 2 (two) times daily. Take for 10 days (Patient not taking: Reported on  08/02/2015) 20 tablet 0  . vitamin C (ASCORBIC ACID) 500 MG tablet Take 500 mg by mouth daily. Reported on 08/02/2015    . Vitamin D, Ergocalciferol, (DRISDOL) 50000 UNITS CAPS capsule TAKE 1 CAPSULE BY MOUTH EVERY OTHER WEEK 12 capsule 2   No facility-administered medications prior to visit.    PAST MEDICAL HISTORY: Past Medical History  Diagnosis Date  . Dermatomyositis (HCC)     remission on pred until 2008, rheum at [facility]  . Glaucoma   . GERD (gastroesophageal reflux disease)     agravated with Fosamax  . Status post dilation of esophageal narrowing   . Abnormal EEG 01/14/12    started on Keppra  . Hypertension 2013  . Vitamin D deficiency disease   . Urinary incontinence, mixed 07/01/11    Fitted for 2.75 " Ring Pessary with support  . PMB (postmenopausal bleeding) 08/2003  . Memory loss   . Seizures (HCC)     PAST SURGICAL HISTORY: Past Surgical History  Procedure Laterality Date  . No past surgeries    . Colonoscopy  09/25/04    Dr. Loreta Ave  . Esophagogastroduodenoscopy endoscopy  04/29/04    anemia without cause  . Colonoscopy  02/2009  . Endometrial biopsy  08/24/03    weak proliferative endo, SHGM neg.    FAMILY HISTORY: Family History  Problem Relation Age of Onset  . Dementia Father     died age 29, otherwise healthy  . Heart disease Maternal Grandfather   . Heart disease Maternal Grandmother   . Hypertension Mother   . Seizures Neg Hx     SOCIAL HISTORY: Social History   Social History  . Marital Status: Married    Spouse Name: [name]  . Number of Children: 1  . Years of Education: hs   Occupational History  . housewife    Social History Main Topics  . Smoking status: Former Smoker -- 1.00 packs/day for 15 years    Types: Cigarettes    Quit date: 02/04/1976  . Smokeless tobacco: Never Used  . Alcohol Use: 4.2 oz/week    7 Glasses of wine per week     Comment: 1 glass of wine per day  . Drug Use: No  . Sexual Activity:    Partners:  Male    Birth Control/ Protection: Post-menopausal   Other Topics Concern  . Not on file   Social History Narrative   Married, lives with spouse [name]).   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   Patient has one child.   Patient is right-handed.   Patient drinks 2-3 cups of coffee daily and maybe half cup at night.                     PHYSICAL EXAM  Filed Vitals:   08/02/15 1045  BP: 160/86  Pulse: 60  Height: 5' (1.524 m)  Weight: 105 lb (47.628 kg)   Body mass index is 20.51 kg/(m^2).  Generalized: Well developed, in no acute distress   Neurological examination  Mentation: Alert oriented to time, place, history taking. Follows all commands speech and language fluent Cranial nerve II-XII: Pupils were equal round reactive to light. Extraocular movements were full, visual field were full on confrontational test. Facial sensation and strength were normal. Uvula tongue midline. Head turning and shoulder shrug  were normal and symmetric. Motor: The motor testing reveals 5 over 5 strength of all 4 extremities. Good symmetric motor tone is noted throughout.  Sensory: Sensory testing is intact to soft touch on all 4 extremities. No evidence of extinction is noted.  Coordination: Cerebellar testing reveals good finger-nose-finger and heel-to-shin bilaterally.  Gait and station: Gait is normal. Slightly unsteady. Romberg is negative. No drift is seen.  Reflexes: Deep tendon reflexes are symmetric and normal bilaterally.   DIAGNOSTIC DATA (LABS, IMAGING, TESTING) - I reviewed patient records, labs, notes, testing and imaging myself where available.  Lab Results  Component Value Date   WBC 12.0* 12/13/2013   HGB 14.5 12/13/2013   HCT 42.9 12/13/2013   MCV 95.2 12/13/2013   PLT 305.0 12/13/2013      Component Value Date/Time   NA 139 12/13/2013 1207   NA 136* 04/17/2011   K 4.5 12/13/2013 1207   CL 104  12/13/2013 1207   CO2 27 12/13/2013 1207   GLUCOSE 93 12/13/2013 1207   BUN 17 12/13/2013 1207   BUN 12 04/17/2011   CREATININE 0.6 12/13/2013 1207   CREATININE 0.7 04/17/2011   CALCIUM 10.0 12/13/2013 1207   PROT 7.9 12/13/2013 1207   ALBUMIN 3.9 12/13/2013 1207   AST 29 12/13/2013 1207   ALT 20 12/13/2013 1207   ALKPHOS 51 12/13/2013 1207   BILITOT 0.7 12/13/2013 1207   Lab Results  Component Value Date   CHOL 237* 12/13/2013   HDL 64.10 12/13/2013   LDLCALC 144* 12/13/2013   LDLDIRECT 129.4 08/24/2012   TRIG 143.0 12/13/2013   CHOLHDL 4 12/13/2013   No results found for: HGBA1C Lab Results  Component Value Date   VITAMINB12 1201* 12/08/2011   Lab Results  Component Value Date   TSH 1.17 12/13/2013      ASSESSMENT AND PLAN 70 y.o. year old female  has a past medical history of Dermatomyositis (HCC); Glaucoma; GERD (gastroesophageal reflux disease); Status post dilation of esophageal narrowing; Abnormal EEG (01/14/12); Hypertension (2013); Vitamin D deficiency disease; Urinary incontinence, mixed (07/01/11); PMB (postmenopausal bleeding) (08/2003); Memory loss; and Seizures (HCC). here with:  1. Seizures 2. Memory disturbance  The patient will remain on Keppra for seizure prevention. Her memory score has slightly declined. We have discussed Namenda but the patient would like to continue monitoring her memory at this time. She states that her family has not noticed any changes in her memory. I advised that we will see her back in 6 months and we'll repeat the MMSE at that time. She verbalized understanding and is amenable to this plan.     Butch PennyMegan Solange Emry, MSN, NP-C 08/02/2015, 11:27 AM [facility] [address], [address] [place], [place] [id number] [phone]

## 2015-08-02 NOTE — Patient Instructions (Signed)
Continue Keppra Memory score slightly declined Consider namenda?  Memantine Tablets What is this medicine? MEMANTINE (MEM an teen) is used to treat dementia caused by Alzheimer's disease. This medicine may be used for other purposes; ask your health care provider or pharmacist if you have questions. What should I tell my health care provider before I take this medicine? They need to know if you have any of these conditions: -difficulty passing urine -kidney disease -liver disease -seizures -an unusual or allergic reaction to memantine, other medicines, foods, dyes, or preservatives -pregnant or trying to get pregnant -breast-feeding How should I use this medicine? Take this medicine by mouth with a glass of water. Follow the directions on the prescription label. You may take this medicine with or without food. Take your doses at regular intervals. Do not take your medicine more often than directed. Continue to take your medicine even if you feel better. Do not stop taking except on the advice of your doctor or health care professional. Talk to your pediatrician regarding the use of this medicine in children. Special care may be needed. Overdosage: If you think you have taken too much of this medicine contact a poison control center or emergency room at once. NOTE: This medicine is only for you. Do not share this medicine with others. What if I miss a dose? If you miss a dose, take it as soon as you can. If it is almost time for your next dose, take only that dose. Do not take double or extra doses. If you do not take your medicine for several days, contact your health care provider. Your dose may need to be changed. What may interact with this medicine? -acetazolamide -amantadine -cimetidine -dextromethorphan -dofetilide -hydrochlorothiazide -ketamine -metformin -methazolamide -quinidine -ranitidine -sodium bicarbonate -triamterene This list may not describe all possible  interactions. Give your health care provider a list of all the medicines, herbs, non-prescription drugs, or dietary supplements you use. Also tell them if you smoke, drink alcohol, or use illegal drugs. Some items may interact with your medicine. What should I watch for while using this medicine? Visit your doctor or health care professional for regular checks on your progress. Check with your doctor or health care professional if there is no improvement in your symptoms or if they get worse. You may get drowsy or dizzy. Do not drive, use machinery, or do anything that needs mental alertness until you know how this drug affects you. Do not stand or sit up quickly, especially if you are an older patient. This reduces the risk of dizzy or fainting spells. Alcohol can make you more drowsy and dizzy. Avoid alcoholic drinks. What side effects may I notice from receiving this medicine? Side effects that you should report to your doctor or health care professional as soon as possible: -allergic reactions like skin rash, itching or hives, swelling of the face, lips, or tongue -agitation or a feeling of restlessness -depressed mood -dizziness -hallucinations -redness, blistering, peeling or loosening of the skin, including inside the mouth -seizures -vomiting Side effects that usually do not require medical attention (report to your doctor or health care professional if they continue or are bothersome): -constipation -diarrhea -headache -nausea -trouble sleeping This list may not describe all possible side effects. Call your doctor for medical advice about side effects. You may report side effects to FDA at 1-800-FDA-1088. Where should I keep my medicine? Keep out of the reach of children. Store at room temperature between 15 degrees and 30 degrees C (  59 degrees and 86 degrees F). Throw away any unused medicine after the expiration date. NOTE: This sheet is a summary. It may not cover all possible  information. If you have questions about this medicine, talk to your doctor, pharmacist, or health care provider.    2016, Elsevier/Gold Standard. (2012-11-08 14:10:42)

## 2015-08-03 ENCOUNTER — Other Ambulatory Visit: Payer: Self-pay | Admitting: Internal Medicine

## 2015-08-11 ENCOUNTER — Other Ambulatory Visit: Payer: Self-pay | Admitting: Neurology

## 2015-08-14 ENCOUNTER — Other Ambulatory Visit: Payer: Self-pay | Admitting: Internal Medicine

## 2015-09-05 ENCOUNTER — Other Ambulatory Visit: Payer: Self-pay | Admitting: Internal Medicine

## 2015-09-05 ENCOUNTER — Other Ambulatory Visit: Payer: Self-pay | Admitting: Nurse Practitioner

## 2015-09-05 NOTE — Telephone Encounter (Signed)
No mammogram since 2015 - no refill.  AEX is coming up soon - see if she can do Mammo before exam.

## 2015-09-05 NOTE — Telephone Encounter (Signed)
Medication refill request: Estradiol (not on current med list)  Last AEX:  01-01-15  Next AEX: 01-08-16 Last MMG (if hormonal medication request): 09-27-13 WNL  Refill authorized: please advise

## 2015-09-06 NOTE — Telephone Encounter (Signed)
Spoke with patient and informed her that she needs to get an updated mammogram before her HRT can be refilled. Patient voiced April Chung

## 2015-09-08 ENCOUNTER — Other Ambulatory Visit: Payer: Self-pay | Admitting: Internal Medicine

## 2015-10-13 ENCOUNTER — Other Ambulatory Visit: Payer: Self-pay | Admitting: Internal Medicine

## 2015-10-16 ENCOUNTER — Other Ambulatory Visit: Payer: Self-pay | Admitting: Internal Medicine

## 2015-10-22 ENCOUNTER — Other Ambulatory Visit: Payer: Self-pay | Admitting: Internal Medicine

## 2015-11-09 ENCOUNTER — Other Ambulatory Visit: Payer: Self-pay | Admitting: Internal Medicine

## 2015-11-20 ENCOUNTER — Encounter: Payer: Self-pay | Admitting: Internal Medicine

## 2015-11-20 DIAGNOSIS — H4301 Vitreous prolapse, right eye: Secondary | ICD-10-CM | POA: Diagnosis not present

## 2015-11-20 DIAGNOSIS — H18453 Nodular corneal degeneration, bilateral: Secondary | ICD-10-CM | POA: Diagnosis not present

## 2015-11-20 DIAGNOSIS — H353130 Nonexudative age-related macular degeneration, bilateral, stage unspecified: Secondary | ICD-10-CM | POA: Diagnosis not present

## 2015-11-20 DIAGNOSIS — H401133 Primary open-angle glaucoma, bilateral, severe stage: Secondary | ICD-10-CM | POA: Diagnosis not present

## 2015-11-20 DIAGNOSIS — Z961 Presence of intraocular lens: Secondary | ICD-10-CM | POA: Diagnosis not present

## 2015-11-20 NOTE — Progress Notes (Signed)
Subjective:    Patient ID: April RainwaterMyra M Chung, female    DOB: 05/12/1945, 70 y.o.   MRN: [id number]  HPI  NO SHOW  Medications and allergies reviewed with patient and updated if appropriate.  Patient Active Problem List   Diagnosis Date Noted  . Urinary incontinence in female 11/09/2012  . Osteopenia 11/09/2012  . Unspecified vitamin D deficiency 11/09/2012  . ADD (attention deficit disorder) 02/24/2012  . Seizure disorder (HCC)   . Hypertension   . Memory disturbance 12/08/2011  . Change in bowel habits   . GERD (gastroesophageal reflux disease) 05/28/2011  . Glaucoma 05/28/2011  . Dermatomyositis (HCC) 05/28/2011    Current Outpatient Prescriptions on File Prior to Visit  Medication Sig Dispense Refill  . amLODipine (NORVASC) 2.5 MG tablet Take 1 tablet by mouth daily. ---must keep appt for further refills. 90 tablet 0  . aspirin 81 MG tablet Take 81 mg by mouth daily.    . citalopram (CELEXA) 20 MG tablet TAKE 1 TABLET BY MOUTH EVERY DAY 30 tablet 0  . ergocalciferol (VITAMIN D2) 50000 units capsule Take 50,000 Units by mouth once a week.    . fish oil-omega-3 fatty acids 1000 MG capsule Take 1 g by mouth daily.    Marland Kitchen. levETIRAcetam (KEPPRA) 250 MG tablet TAKE 1 TABLET BY MOUTH 2 TIMES DAILY 180 tablet 3  . losartan (COZAAR) 100 MG tablet Take 0.5 tablets (50 mg total) by mouth daily. 45 tablet 3  . LUMIGAN 0.01 % SOLN Place 1 drop into both eyes at bedtime.     . medroxyPROGESTERone (PROVERA) 2.5 MG tablet Take 1 tablet (2.5 mg total) by mouth daily. 90 tablet 3  . Multiple Vitamins-Calcium (ONE-A-DAY WOMENS PO) Take by mouth daily.    Marland Kitchen. PATADAY 0.2 % SOLN Place into both eyes daily as needed.    . Probiotic Product (ALIGN) 4 MG CAPS Take by mouth every morning. Reported on 08/02/2015     No current facility-administered medications on file prior to visit.     Past Medical History:  Diagnosis Date  . Abnormal EEG 01/14/12   started on Keppra  . Dermatomyositis (HCC)    remission on pred until 2008, [facility]  . GERD (gastroesophageal reflux disease)    agravated with Fosamax  . Glaucoma   . Hypertension 2013  . Memory loss   . PMB (postmenopausal bleeding) 08/2003  . Seizures (HCC)   . Status post dilation of esophageal narrowing   . Urinary incontinence, mixed 07/01/11   Fitted for 2.75 " Ring Pessary with support  . Vitamin D deficiency disease     Past Surgical History:  Procedure Laterality Date  . COLONOSCOPY  09/25/04   Dr. Loreta AveMann  . COLONOSCOPY  02/2009  . ENDOMETRIAL BIOPSY  08/24/03   weak proliferative endo, [facility] neg.  Marland Kitchen. ESOPHAGOGASTRODUODENOSCOPY ENDOSCOPY  04/29/04   anemia without cause  . NO PAST SURGERIES      Social History   Social History  . Marital status: Married    Spouse name: April Chung  . Number of children: 1  . Years of education: hs   Occupational History  . housewife    Social History Main Topics  . Smoking status: Former Smoker    Packs/day: 1.00    Years: 15.00    Types: Cigarettes    Quit date: 02/04/1976  . Smokeless tobacco: Never Used  . Alcohol use 4.2 oz/week    7 Glasses of wine per week  Comment: 1 glass of wine per day  . Drug use: No  . Sexual activity: Yes    Partners: Male    Birth control/ protection: Post-menopausal   Other Topics Concern  . Not on file   Social History Narrative   Married, lives with spouse April Fearing).   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   Patient has one child.   Patient is right-handed.   Patient drinks 2-3 cups of coffee daily and maybe half cup at night.                   Family History  Problem Relation Age of Onset  . Dementia Father     died age 43, otherwise healthy  . Heart disease Maternal Grandfather   . Heart disease Maternal Grandmother   . Hypertension Mother   . Seizures Neg Hx     Review of Systems     Objective:  There were no vitals filed for this  visit. There were no vitals filed for this visit. There is no height or weight on file to calculate BMI.   Physical Exam          Assessment & Plan:       This encounter was created in error - please disregard.

## 2015-11-21 DIAGNOSIS — H401132 Primary open-angle glaucoma, bilateral, moderate stage: Secondary | ICD-10-CM | POA: Diagnosis not present

## 2015-11-29 DIAGNOSIS — H04123 Dry eye syndrome of bilateral lacrimal glands: Secondary | ICD-10-CM | POA: Diagnosis not present

## 2015-11-29 DIAGNOSIS — H16143 Punctate keratitis, bilateral: Secondary | ICD-10-CM | POA: Diagnosis not present

## 2015-11-29 DIAGNOSIS — H401131 Primary open-angle glaucoma, bilateral, mild stage: Secondary | ICD-10-CM | POA: Diagnosis not present

## 2015-12-04 DIAGNOSIS — Z79899 Other long term (current) drug therapy: Secondary | ICD-10-CM | POA: Diagnosis not present

## 2015-12-04 DIAGNOSIS — Z888 Allergy status to other drugs, medicaments and biological substances status: Secondary | ICD-10-CM | POA: Diagnosis not present

## 2015-12-04 DIAGNOSIS — F4323 Adjustment disorder with mixed anxiety and depressed mood: Secondary | ICD-10-CM | POA: Diagnosis not present

## 2015-12-04 DIAGNOSIS — G3184 Mild cognitive impairment, so stated: Secondary | ICD-10-CM | POA: Diagnosis not present

## 2015-12-05 DIAGNOSIS — Z23 Encounter for immunization: Secondary | ICD-10-CM | POA: Diagnosis not present

## 2015-12-05 DIAGNOSIS — F4323 Adjustment disorder with mixed anxiety and depressed mood: Secondary | ICD-10-CM | POA: Insufficient documentation

## 2015-12-14 ENCOUNTER — Other Ambulatory Visit: Payer: Self-pay | Admitting: Internal Medicine

## 2015-12-22 ENCOUNTER — Other Ambulatory Visit: Payer: Self-pay | Admitting: Internal Medicine

## 2015-12-24 ENCOUNTER — Other Ambulatory Visit: Payer: Self-pay | Admitting: *Deleted

## 2015-12-24 NOTE — Telephone Encounter (Signed)
Medication refill request: Provera Last AEX:  01-01-15  Next AEX: 01-08-16  Last MMG (if hormonal medication request): 09-27-13 WNL  Refill authorized: please advise

## 2015-12-25 ENCOUNTER — Other Ambulatory Visit: Payer: Self-pay | Admitting: Internal Medicine

## 2015-12-25 MED ORDER — MEDROXYPROGESTERONE ACETATE 2.5 MG PO TABS: 2.5000 mg | ORAL_TABLET | Freq: Every day | ORAL | 0 refills | Status: DC

## 2015-12-28 ENCOUNTER — Other Ambulatory Visit: Payer: Self-pay | Admitting: Internal Medicine

## 2015-12-31 ENCOUNTER — Other Ambulatory Visit: Payer: Self-pay | Admitting: Internal Medicine

## 2016-01-02 ENCOUNTER — Encounter: Payer: Self-pay | Admitting: Nurse Practitioner

## 2016-01-02 ENCOUNTER — Other Ambulatory Visit (INDEPENDENT_AMBULATORY_CARE_PROVIDER_SITE_OTHER): Payer: Medicare Other

## 2016-01-02 ENCOUNTER — Ambulatory Visit (INDEPENDENT_AMBULATORY_CARE_PROVIDER_SITE_OTHER): Payer: Medicare Other | Admitting: Nurse Practitioner

## 2016-01-02 VITALS — BP 140/72 | HR 52 | Temp 97.6°F | Ht 60.0 in | Wt 108.0 lb

## 2016-01-02 DIAGNOSIS — E559 Vitamin D deficiency, unspecified: Secondary | ICD-10-CM

## 2016-01-02 DIAGNOSIS — I10 Essential (primary) hypertension: Secondary | ICD-10-CM

## 2016-01-02 DIAGNOSIS — M8589 Other specified disorders of bone density and structure, multiple sites: Secondary | ICD-10-CM | POA: Diagnosis not present

## 2016-01-02 DIAGNOSIS — Z136 Encounter for screening for cardiovascular disorders: Secondary | ICD-10-CM

## 2016-01-02 DIAGNOSIS — F339 Major depressive disorder, recurrent, unspecified: Secondary | ICD-10-CM | POA: Diagnosis not present

## 2016-01-02 MED ORDER — LOSARTAN POTASSIUM 100 MG PO TABS: 50.0000 mg | ORAL_TABLET | Freq: Every day | ORAL | 1 refills | Status: DC

## 2016-01-02 MED ORDER — CITALOPRAM HYDROBROMIDE 20 MG PO TABS: 20.0000 mg | ORAL_TABLET | Freq: Every day | ORAL | 3 refills | Status: DC

## 2016-01-02 MED ORDER — AMLODIPINE BESYLATE 2.5 MG PO TABS: ORAL_TABLET | ORAL | 2 refills | Status: DC

## 2016-01-02 NOTE — Progress Notes (Signed)
Pre visit review using our clinic review tool, if applicable. No additional management support is needed unless otherwise documented below in the visit note. 

## 2016-01-02 NOTE — Progress Notes (Signed)
Subjective:  Patient ID: April RainwaterMyra M Chung, female    DOB: 08/04/1945  Age: 70 y.o. MRN: [id number]  CC: Establish Care (establish care with PCP,refill on meds)   HPI   HTN:  No chest pain or palpitation or edema or headache.  Depression: Controlled with Celexa. No suicidal ideation.  Seizure: No seizure activity.  Outpatient Medications Prior to Visit  Medication Sig Dispense Refill  . aspirin 81 MG tablet Take 81 mg by mouth daily.    . ergocalciferol (VITAMIN D2) 50000 units capsule Take 50,000 Units by mouth once a week.    . fish oil-omega-3 fatty acids 1000 MG capsule Take 1 g by mouth daily.    Marland Kitchen. latanoprost (XALATAN) 0.005 % ophthalmic solution   1  . levETIRAcetam (KEPPRA) 250 MG tablet TAKE 1 TABLET BY MOUTH 2 TIMES DAILY 180 tablet 3  . LUMIGAN 0.01 % SOLN Place 1 drop into both eyes at bedtime.     . medroxyPROGESTERone (PROVERA) 2.5 MG tablet Take 1 tablet (2.5 mg total) by mouth daily. 30 tablet 0  . Multiple Vitamins-Calcium (ONE-A-DAY WOMENS PO) Take by mouth daily.    Marland Kitchen. PATADAY 0.2 % SOLN Place into both eyes daily as needed.    . Probiotic Product (ALIGN) 4 MG CAPS Take by mouth every morning. Reported on 08/02/2015    . amLODipine (NORVASC) 2.5 MG tablet Take 1 tablet by mouth daily. ---must keep appt for further refills. 90 tablet 0  . citalopram (CELEXA) 20 MG tablet TAKE 1 TABLET BY MOUTH EVERY DAY 30 tablet 0  . losartan (COZAAR) 100 MG tablet Take 0.5 tablets (50 mg total) by mouth daily. 45 tablet 3   No facility-administered medications prior to visit.     ROS See HPI  Objective:  BP 140/72   Pulse (!) 52   Temp 97.6 F (36.4 C)   Ht 5' (1.524 m)   Wt 108 lb (49 kg)   LMP 08/03/1993 (Approximate)   SpO2 97%   BMI 21.09 kg/m   BP Readings from Last 3 Encounters:  01/02/16 140/72  08/02/15 (!) 160/86  01/01/15 (!) 132/94    Wt Readings from Last 3 Encounters:  01/02/16 108 lb (49 kg)  08/02/15 105 lb (47.6 kg)  01/01/15 105 lb (47.6 kg)      Physical Exam  Constitutional: She is oriented to person, place, and time. No distress.  Cardiovascular: Normal rate, regular rhythm and normal heart sounds.   Pulmonary/Chest: Breath sounds normal.  Musculoskeletal: She exhibits no edema.  Neurological: She is alert and oriented to person, place, and time.  Skin: Skin is warm and dry.  Psychiatric: Her behavior is normal.  Vitals reviewed.   Lab Results  Component Value Date   WBC 12.0 (H) 12/13/2013   HGB 14.5 12/13/2013   HCT 42.9 12/13/2013   PLT 305.0 12/13/2013   GLUCOSE 93 12/13/2013   CHOL 237 (H) 12/13/2013   TRIG 143.0 12/13/2013   HDL 64.10 12/13/2013   LDLDIRECT 129.4 08/24/2012   LDLCALC 144 (H) 12/13/2013   ALT 20 12/13/2013   AST 29 12/13/2013   NA 139 12/13/2013   K 4.5 12/13/2013   CL 104 12/13/2013   CREATININE 0.6 12/13/2013   BUN 17 12/13/2013   CO2 27 12/13/2013   TSH 1.17 12/13/2013    Mr Brain Wo Contrast  Result Date: 12/23/2011 *RADIOLOGY REPORT* Clinical Data: 70 year old female with memory loss.  Memory disturbance. MRI HEAD WITHOUT CONTRAST Technique:  Multiplanar, multiecho pulse  sequences of the brain and surrounding structures were obtained according to standard protocol without intravenous contrast. Comparison: Head CT without contrast 03/31/2006. Findings: Chronic severe lateral and third ventriculomegaly.  This is not significantly changed since 2008.  As before, there is asymmetric enlargement of the left lateral ventricle, particulate at the superior left frontal gyrus which may be ex vacuo related, uncertain.  No areas of transependymal edema.  The fourth ventricle is normal.  The cerebral aqueduct appears within normal limits. Stable cerebral volume. No restricted diffusion to suggest acute infarction.  No midline shift, mass effect, or evidence of mass lesion.  No acute intracranial hemorrhage identified.  Negative pituitary.  Negative cervicomedullary junction.  Negative for age  visualized cervical spine. Major intracranial vascular flow voids are preserved.  Mild patchy periventricular and subcortical white matter T2 and FLAIR hyperintensity, greater along the left lateral ventricle.  No areas of cortical encephalomalacia.  Deep gray matter nuclei, brainstem and cerebellum within normal limits. Mesial temporal lobe structures appear symmetric and normal except for the prominence of the temporal horns.  Grossly normal visualized internal auditory structures.  Postoperative changes to the globes. Visualized paranasal sinuses and mastoids are clear.  Negative scalp soft tissues.  Normal bone marrow signal. IMPRESSION: 1.  Chronic severe lateral and third ventriculomegaly.  Etiology is unclear.  The left lateral ventricular findings might in part reflect ex vacuo change with apparent decreased left frontal lobe volume. 2.  Mild superimposed nonspecific cerebral white matter signal changes. 3. No acute intracranial abnormality. Original Report Authenticated By: Erskine SpeedH. Hall III, M.D.    Assessment & Plan:   April Chung was seen today for establish care.  Diagnoses and all orders for this visit:  Essential hypertension -     amLODipine (NORVASC) 2.5 MG tablet; Take 1 tablet by mouth daily. ---must keep appt for further refills. -     losartan (COZAAR) 100 MG tablet; Take 0.5 tablets (50 mg total) by mouth daily. -     Comprehensive metabolic panel; Future  Osteopenia of multiple sites -     DG Bone Density; Future -     Vitamin D 1,25 dihydroxy; Future  Vitamin D deficiency -     DG Bone Density; Future -     Vitamin D 1,25 dihydroxy; Future  Encounter for screening for cardiovascular disorders -     Lipid panel; Future  Recurrent major depressive disorder, remission status unspecified (HCC) -     citalopram (CELEXA) 20 MG tablet; Take 1 tablet (20 mg total) by mouth daily.   I have changed April Chung's citalopram. I am also having her maintain her aspirin, fish oil-omega-3 fatty  acids, Multiple Vitamins-Calcium (ONE-A-DAY WOMENS PO), ALIGN, LUMIGAN, PATADAY, ergocalciferol, levETIRAcetam, latanoprost, medroxyPROGESTERone, donepezil, Multiple Vitamins-Minerals (MULTIVITAMIN ADULT PO), cyclobenzaprine, DOCOSAHEXAENOIC ACID PO, amLODipine, and losartan.  Meds ordered this encounter  Medications  . donepezil (ARICEPT) 5 MG tablet    Refill:  0  . Multiple Vitamins-Minerals (MULTIVITAMIN ADULT PO)    Sig: Take by mouth.  . cyclobenzaprine (FLEXERIL) 10 MG tablet    Sig: Take by mouth.  . DOCOSAHEXAENOIC ACID PO    Sig: Take 1 g by mouth.  . citalopram (CELEXA) 20 MG tablet    Sig: Take 1 tablet (20 mg total) by mouth daily.    Dispense:  30 tablet    Refill:  3    Order Specific Question:   Supervising Provider    Answer:   Tresa GarterPLOTNIKOV, ALEKSEI V [[id number]]  . amLODipine (NORVASC)  2.5 MG tablet    Sig: Take 1 tablet by mouth daily. ---must keep appt for further refills.    Dispense:  30 tablet    Refill:  2    Order Specific Question:   Supervising Provider    Answer:   Tresa Garter [[id number]]  . losartan (COZAAR) 100 MG tablet    Sig: Take 0.5 tablets (50 mg total) by mouth daily.    Dispense:  30 tablet    Refill:  1    Order Specific Question:   Supervising Provider    Answer:   Tresa Garter [[id number]]    Follow-up: Return as scheduled, for HTN, hyperlipidemia.  Alysia Penna, NP

## 2016-01-02 NOTE — Patient Instructions (Signed)
Follow up as scheduled.  Go to basement for lab draw.  We will call with lab results

## 2016-01-03 ENCOUNTER — Telehealth: Payer: Self-pay | Admitting: Nurse Practitioner

## 2016-01-03 LAB — COMPREHENSIVE METABOLIC PANEL
ALBUMIN: 4.5 g/dL (ref 3.5–5.2)
ALK PHOS: 51 U/L (ref 39–117)
ALT: 18 U/L (ref 0–35)
AST: 26 U/L (ref 0–37)
BILIRUBIN TOTAL: 0.6 mg/dL (ref 0.2–1.2)
BUN: 21 mg/dL (ref 6–23)
CO2: 29 mEq/L (ref 19–32)
Calcium: 9.4 mg/dL (ref 8.4–10.5)
Chloride: 103 mEq/L (ref 96–112)
Creatinine, Ser: 0.66 mg/dL (ref 0.40–1.20)
GFR: 93.35 mL/min (ref >60.00)
GLUCOSE: 82 mg/dL (ref 70–99)
Potassium: 4.2 mEq/L (ref 3.5–5.1)
SODIUM: 139 meq/L (ref 135–145)
TOTAL PROTEIN: 7.6 g/dL (ref 6.0–8.3)

## 2016-01-03 LAB — LIPID PANEL
Cholesterol: 213 mg/dL — ABNORMAL HIGH (ref 0–200)
HDL: 60 mg/dL (ref >39.00)
LDL Cholesterol: 129 mg/dL — ABNORMAL HIGH (ref 0–99)
NONHDL: 152.61
Total CHOL/HDL Ratio: 4
Triglycerides: 119 mg/dL (ref 0.0–149.0)
VLDL: 23.8 mg/dL (ref 0.0–40.0)

## 2016-01-03 NOTE — Telephone Encounter (Signed)
Spoke with patients husband "Rosanne AshingJim", ok per current dpr. Rosanne AshingJim states wife is experiencing increase in memory loss and has canceled previous appt and was told by pharmacy she will need appt for refills. Rosanne AshingJim trying to figure out what is needed at this point, as wife is upset about upcoming appt. RN advised Rosanne AshingJim, upcoming appointment is for AEX, last AEX 01/01/15. Advised that provera was sent to pharmacy 12/25/15 -will need AEX appt for refills on this medication. Jim rescheduled 01/04/16 AEX to 01/22/16 at 1:45pm with Ria CommentPatricia Grubb, NP. Rosanne AshingJim is agreeable to date and time.   Routing to provider for final review. Patient is agreeable to disposition. Will close encounter.

## 2016-01-03 NOTE — Telephone Encounter (Addendum)
Patient's husband "Rosanne AshingJim" is calling regarding his wife "April Chung" appointment tomorrow. Rosanne AshingJim said "[name] has memory issues and is unsure about needing this appointment. Rosanne AshingJim said that he received a message from the pharmacy about her prescription and was told to call our office that she needs an appointment. Eneida is telling Rosanne AshingJim she has plenty of "pills" and does not need this appointment. DPR on file to talk with Rosanne AshingJim.    FYI: Patient called stating she received a reminder call for 01/08/16 and wanted to cancel the appointment. Patient's appointment is tomorrow not 01/08/16.

## 2016-01-04 ENCOUNTER — Ambulatory Visit: Payer: Medicare Other | Admitting: Nurse Practitioner

## 2016-01-05 LAB — VITAMIN D 1,25 DIHYDROXY
VITAMIN D 1, 25 (OH) TOTAL: 72 pg/mL (ref 18–72)
Vitamin D2 1, 25 (OH)2: <8 pg/mL
Vitamin D3 1, 25 (OH)2: 72 pg/mL

## 2016-01-07 NOTE — Progress Notes (Signed)
Normal results, see office note

## 2016-01-08 ENCOUNTER — Ambulatory Visit: Payer: Medicare Other | Admitting: Nurse Practitioner

## 2016-01-08 ENCOUNTER — Ambulatory Visit (INDEPENDENT_AMBULATORY_CARE_PROVIDER_SITE_OTHER)
Admission: RE | Admit: 2016-01-08 | Discharge: 2016-01-08 | Disposition: A | Discharge status: Home / Self Care | Payer: Medicare Other | Source: Ambulatory Visit | Attending: Internal Medicine | Admitting: Internal Medicine

## 2016-01-08 DIAGNOSIS — M8589 Other specified disorders of bone density and structure, multiple sites: Secondary | ICD-10-CM | POA: Diagnosis not present

## 2016-01-08 DIAGNOSIS — E559 Vitamin D deficiency, unspecified: Secondary | ICD-10-CM

## 2016-01-10 DIAGNOSIS — M8589 Other specified disorders of bone density and structure, multiple sites: Secondary | ICD-10-CM | POA: Diagnosis not present

## 2016-01-10 DIAGNOSIS — H16223 Keratoconjunctivitis sicca, not specified as Sjogren's, bilateral: Secondary | ICD-10-CM | POA: Diagnosis not present

## 2016-01-10 DIAGNOSIS — H16143 Punctate keratitis, bilateral: Secondary | ICD-10-CM | POA: Diagnosis not present

## 2016-01-10 DIAGNOSIS — H04123 Dry eye syndrome of bilateral lacrimal glands: Secondary | ICD-10-CM | POA: Diagnosis not present

## 2016-01-10 DIAGNOSIS — H401131 Primary open-angle glaucoma, bilateral, mild stage: Secondary | ICD-10-CM | POA: Diagnosis not present

## 2016-01-11 ENCOUNTER — Telehealth: Payer: Self-pay | Admitting: Nurse Practitioner

## 2016-01-11 NOTE — Telephone Encounter (Signed)
Please return patient call.  She is calling you back.

## 2016-01-22 ENCOUNTER — Telehealth: Payer: Self-pay | Admitting: Nurse Practitioner

## 2016-01-22 ENCOUNTER — Encounter: Payer: Self-pay | Admitting: Nurse Practitioner

## 2016-01-22 ENCOUNTER — Ambulatory Visit (INDEPENDENT_AMBULATORY_CARE_PROVIDER_SITE_OTHER): Payer: Medicare Other | Admitting: Nurse Practitioner

## 2016-01-22 VITALS — BP 140/74 | HR 64 | Ht 60.0 in | Wt 108.0 lb

## 2016-01-22 DIAGNOSIS — M8580 Other specified disorders of bone density and structure, unspecified site: Secondary | ICD-10-CM | POA: Diagnosis not present

## 2016-01-22 DIAGNOSIS — Z124 Encounter for screening for malignant neoplasm of cervix: Secondary | ICD-10-CM | POA: Diagnosis not present

## 2016-01-22 DIAGNOSIS — Z01419 Encounter for gynecological examination (general) (routine) without abnormal findings: Secondary | ICD-10-CM | POA: Diagnosis not present

## 2016-01-22 DIAGNOSIS — Z0000 Encounter for general adult medical examination without abnormal findings: Secondary | ICD-10-CM | POA: Diagnosis not present

## 2016-01-22 NOTE — Telephone Encounter (Signed)
Spoke with patient's husband Rosanne AshingJim, okay per ROI. Rosanne AshingJim is asking when the patient is due for her next mammogram appointment. Advised patient's last [facility] on [facility]. Patient is due for screening mammogram. Rosanne AshingJim is agreeable and will contact [facility] to schedule. Telephone number to [facility] provided to TrentonJim as [phone].  Routing to provider for final review. Patient agreeable to disposition. Will close encounter.

## 2016-01-22 NOTE — Patient Instructions (Signed)

## 2016-01-22 NOTE — Progress Notes (Addendum)
Patient ID: April RainwaterMyra M Chung, female   DOB: 04/10/1945, 70 y.o.   MRN: [id number]  70 y.o. [id number] Married  Caucasian Fe here for annual exam.  Doing well with pessary.  She finds she does not need to use as regular as she is doing less lifting and work out of doors.  No current sign of UTI.  Per husband she is having more memory issues. Off estrogen at this time.  Patient's last menstrual period was 08/03/1993 (approximate).          Sexually active: No.  The current method of family planning is none.    Exercising: Yes.    walking Smoker:  no  Health Maintenance: Pap: 08/14/08 WNL MMG: 09/27/13, BI-RADS 1, negative Colonoscopy: 03/28/10, rpt 5 years BMD: 01/08/16, T Score: +1.0 Spine / -2.3 Right Femur Neck / -1.7 Left Femur Neck TDaP: 08/24/12 Shingles: 02/04/08 Prevnar 13:  12/13/13 Hep C: 01/01/15 Labs: PCP in EPIC   reports that she quit smoking about 39 years ago. Her smoking use included Cigarettes. She has a 15.00 pack-year smoking history. She has never used smokeless tobacco. She reports that she drinks about 4.2 oz of alcohol per week . She reports that she does not use drugs.  Past Medical History:  Diagnosis Date  . Abnormal EEG 01/14/12   started on Keppra  . Dermatomyositis (HCC)    remission on pred until 2008, [facility]  . GERD (gastroesophageal reflux disease)    agravated with Fosamax  . Glaucoma   . Hypertension 2013  . Memory loss   . PMB (postmenopausal bleeding) 08/2003  . Seizures (HCC)   . Status post dilation of esophageal narrowing   . Urinary incontinence, mixed 07/01/11   Fitted for 2.75 " Ring Pessary with support  . Vitamin D deficiency disease     Past Surgical History:  Procedure Laterality Date  . COLONOSCOPY  09/25/04   Dr. Loreta AveMann  . COLONOSCOPY  02/2009  . ENDOMETRIAL BIOPSY  08/24/03   weak proliferative endo, SHGM neg.  Marland Kitchen. ESOPHAGOGASTRODUODENOSCOPY ENDOSCOPY  04/29/04   anemia without cause  . NO PAST SURGERIES      Current  Outpatient Prescriptions  Medication Sig Dispense Refill  . amLODipine (NORVASC) 2.5 MG tablet Take 1 tablet by mouth daily. ---must keep appt for further refills. 30 tablet 2  . aspirin 81 MG tablet Take 81 mg by mouth daily.    . citalopram (CELEXA) 20 MG tablet Take 1 tablet (20 mg total) by mouth daily. 30 tablet 3  . cyclobenzaprine (FLEXERIL) 10 MG tablet Take by mouth.    . DOCOSAHEXAENOIC ACID PO Take 1 g by mouth.    . donepezil (ARICEPT) 5 MG tablet   0  . ergocalciferol (VITAMIN D2) 50000 units capsule Take 50,000 Units by mouth once a week.    . fish oil-omega-3 fatty acids 1000 MG capsule Take 1 g by mouth daily.    Marland Kitchen. latanoprost (XALATAN) 0.005 % ophthalmic solution   1  . levETIRAcetam (KEPPRA) 250 MG tablet TAKE 1 TABLET BY MOUTH 2 TIMES DAILY 180 tablet 3  . losartan (COZAAR) 100 MG tablet Take 0.5 tablets (50 mg total) by mouth daily. 30 tablet 1  . LUMIGAN 0.01 % SOLN Place 1 drop into both eyes at bedtime.     . medroxyPROGESTERone (PROVERA) 2.5 MG tablet Take 1 tablet (2.5 mg total) by mouth daily. 30 tablet 0  . Multiple Vitamins-Calcium (ONE-A-DAY WOMENS PO) Take by mouth  daily.    . Multiple Vitamins-Minerals (MULTIVITAMIN ADULT PO) Take by mouth.    Marland Kitchen. PATADAY 0.2 % SOLN Place into both eyes daily as needed.    . Probiotic Product (ALIGN) 4 MG CAPS Take by mouth every morning. Reported on 08/02/2015     No current facility-administered medications for this visit.     Family History  Problem Relation Age of Onset  . Dementia Father     died age 72, otherwise healthy  . Heart disease Maternal Grandfather   . Heart disease Maternal Grandmother   . Hypertension Mother   . Seizures Neg Hx     ROS:  Pertinent items are noted in HPI.  Otherwise, a comprehensive ROS was negative.  Exam:   LMP 08/03/1993 (Approximate)    Ht Readings from Last 3 Encounters:  01/02/16 5' (1.524 m)  08/02/15 5' (1.524 m)  01/01/15 5' (1.524 m)    General appearance: alert,  cooperative and appears stated age Head: Normocephalic, without obvious abnormality, atraumatic Neck: no adenopathy, supple, symmetrical, trachea midline and thyroid normal to inspection and palpation Lungs: clear to auscultation bilaterally Breasts: normal appearance, no masses or tenderness Heart: regular rate and rhythm Abdomen: soft, non-tender; no masses,  no organomegaly Extremities: extremities normal, atraumatic, no cyanosis or edema Skin: Skin color, texture, turgor normal. No rashes or lesions Lymph nodes: Cervical, supraclavicular, and axillary nodes normal. No abnormal inguinal nodes palpated Neurologic: Grossly normal   Pelvic: External genitalia:  no lesions              Urethra:  normal appearing urethra with no masses, tenderness or lesions              Bartholin's and Skene's: normal                 Vagina: normal appearing vagina with normal color and discharge, no lesions              Cervix: anteverted              Pap taken: No. Bimanual Exam:  Uterus:  normal size, contour, position, consistency, mobility, non-tender              Adnexa: no mass, fullness, tenderness               Rectovaginal: Confirms               Anus:  normal sphincter tone, no lesions  Chaperone present: yes  A:  Well Woman with normal exam  Postmenopausal on HRT and now off HRT this past year History of mixed urinary incontinence with use of 2.75 " ring pessary with support since 07/01/2011 - now only occasional use             Urethral prolapse Osteopenia off Actonel 05/2008 and managed by PCP (GI SE with Fosamax) Vit D deficiency, HTN History of Dermatomyositis and off Steroids for some time - [facility]             Shingles  Mild cognitive impairment with memory loss   P:   Reviewed health and wellness pertinent to exam  Pap smear as above  Mammogram is due now  Reviewed BMD with her - but will follow recommendations per  PCP  Counseled on breast self exam, mammography screening, adequate intake of calcium and vitamin D, diet and exercise return annually or prn  An After Visit Summary was printed and given to the patient.

## 2016-01-22 NOTE — Telephone Encounter (Signed)
Patient's husband is calling to see if April Chung needs to have a mammogram done. He states her memory is going and he is trying to make sure all her health needs are met.

## 2016-01-29 NOTE — Progress Notes (Signed)
Encounter reviewed by Dr. Janean SarkBrook Amundson C. Chung. Pap done 01/01/15 - negative for dysplasia.

## 2016-01-30 DIAGNOSIS — Z1231 Encounter for screening mammogram for malignant neoplasm of breast: Secondary | ICD-10-CM | POA: Diagnosis not present

## 2016-02-05 ENCOUNTER — Ambulatory Visit (INDEPENDENT_AMBULATORY_CARE_PROVIDER_SITE_OTHER): Payer: Medicare Other | Admitting: Nurse Practitioner

## 2016-02-05 ENCOUNTER — Encounter: Payer: Self-pay | Admitting: Nurse Practitioner

## 2016-02-05 VITALS — BP 134/66 | HR 54 | Temp 97.8°F | Ht 60.0 in | Wt 108.0 lb

## 2016-02-05 DIAGNOSIS — D72829 Elevated white blood cell count, unspecified: Secondary | ICD-10-CM

## 2016-02-05 DIAGNOSIS — G40909 Epilepsy, unspecified, not intractable, without status epilepticus: Secondary | ICD-10-CM

## 2016-02-05 DIAGNOSIS — I10 Essential (primary) hypertension: Secondary | ICD-10-CM | POA: Diagnosis not present

## 2016-02-05 DIAGNOSIS — Z0000 Encounter for general adult medical examination without abnormal findings: Secondary | ICD-10-CM | POA: Diagnosis not present

## 2016-02-05 DIAGNOSIS — E559 Vitamin D deficiency, unspecified: Secondary | ICD-10-CM | POA: Diagnosis not present

## 2016-02-05 DIAGNOSIS — M8580 Other specified disorders of bone density and structure, unspecified site: Secondary | ICD-10-CM

## 2016-02-05 DIAGNOSIS — Z0001 Encounter for general adult medical examination with abnormal findings: Secondary | ICD-10-CM

## 2016-02-05 DIAGNOSIS — R413 Other amnesia: Secondary | ICD-10-CM

## 2016-02-05 DIAGNOSIS — K219 Gastro-esophageal reflux disease without esophagitis: Secondary | ICD-10-CM

## 2016-02-05 MED ORDER — VITAMIN D 1000 UNITS PO TABS: 1000.0000 [IU] | ORAL_TABLET | Freq: Two times a day (BID) | ORAL | 0 refills | Status: DC

## 2016-02-05 MED ORDER — VITAMIN D3 10 MCG (400 UNIT) PO CAPS: 1.0000 | ORAL_CAPSULE | Freq: Two times a day (BID) | ORAL | 0 refills | Status: DC

## 2016-02-05 MED ORDER — CALCIUM 600 MG PO TABS: 600.0000 mg | ORAL_TABLET | Freq: Two times a day (BID) | ORAL | 0 refills | Status: DC

## 2016-02-05 NOTE — Assessment & Plan Note (Signed)
Last dexa scan 01/2016: normal

## 2016-02-05 NOTE — Assessment & Plan Note (Addendum)
aricept stopped by patient.  Last OV 07/2015 Per April PennyMegan Millikan, NP-C ([facility]) "The patient will remain on Keppra for seizure prevention. Her memory score has slightly declined. We have discussed Namenda but the patient would like to continue monitoring her memory at this time. She states that her family has not noticed any changes in her memory. I advised that we will see her back in 6 months and we'll repeat the MMSE at that time. She verbalized understanding and is amenable to this plan."  Done 11/2015 by April PhillipsSamantha Rogers, PA-C (with [facility]): Brief Cognitive testing done with the [facility]([place] Cognitive Assessement), a 30-point test of global cognitive function which is similar to the MMSE, but more sensitive for detecting MCI. she scores 18/30. "Likely has crossed or is crossing over into mild dementia of the Alzheimer's type with lack of atypical presenting signs or symptoms/features. Provided reassurance of her family's love and concern for her and commitment to provide care in all circumstances. I spoke with her privately about her concerns and worries; also conveyed this to her husband who is deeply committed to seeing her through this. Continue exercise daily- walking etc. May benefit from additional psychotherapy- I will contact her therapist, April Chung about this."

## 2016-02-05 NOTE — Assessment & Plan Note (Signed)
Normal vitamin D level 12/2015. Decreased Vitamin D dose to 2000IU daily

## 2016-02-05 NOTE — Progress Notes (Signed)
Subjective:    Patient ID: April Chung, female    DOB: 01/27/1946, 71 y.o.   MRN: [id number]  Patient presents today for complete physical  HPI  She denies any acute complaints today.  Memory Loss:  Stopped taking Aricept over 1year ago per patient. Doing ok without medication. Still has problem with short term memory but compensates by writing things down. She is still driving and performs ADLs independently. Denies getting lost while driving. Denies forgetting family member and close friends.  Seizure: Denies any seizure activity in last 1year. Current use of keppra BID. Managed by [facility].  Depression: Stable with citalopram.  HTN: Stable to amlodipine and losartan  Glaucoma: Stable to with eye drops: systane and xalatan  Hyperlipidemia: Only taking fish oil OTC. Not taking any statin at this time.  Immunizations: (TDAP, Hep C screen, Pneumovax, Influenza, zoster)  Health Maintenance  Topic Date Due  . Shingles Vaccine  03/07/2003  . Mammogram  09/28/2015  . Colon Cancer Screening  03/28/2020  . Tetanus Vaccine  08/25/2022  . Flu Shot  Addressed  . DEXA scan (bone density measurement)  Completed  .  Hepatitis C: One time screening is recommended by Center for Disease Control  (CDC) for  adults born from [birth year] through 1967.   Completed  . Pneumonia vaccines  Completed   Diet:hearthy Weight:  Wt Readings from Last 3 Encounters:  02/05/16 108 lb (49 kg)  01/22/16 108 lb (49 kg)  01/02/16 108 lb (49 kg)   Exercise:walking daily Fall Risk: Fall Risk  02/05/2016 12/13/2013 08/24/2012  Falls in the past year? No No No   Home Safety: home with husband Depression/Suicide: Depression screen [facility] 2/9 02/05/2016 12/13/2013 08/24/2012  Decreased Interest 0 0 0  Down, Depressed, Hopeless 0 1 0  PHQ - 2 Score 0 1 0   MMSE - Mini Mental State Exam 08/02/2015  Orientation to time 2  Orientation to Place 3  Registration 3  Attention/ Calculation 4  Recall 2   Language- name 2 objects 2  Language- repeat 1  Language- follow 3 step command 3  Language- read & follow direction 1  Write a sentence 1  Copy design 1  Total score 23   Colonoscopy (every 5-46yrs, >50-76yrs):up to date Dexa (every 2-67yrs, >10yrs):up to date Pap Smear (every 40yrs for >21-29 without HPV, every 30yrs for >30-24yrs with HPV):up to date, no longer needed Mammogram (yearly, >77yrs):needed, patient will schedule Vision:up to date, current treatment for glaucoma Advanced Directive: Advanced Directives 08/02/2014  Does Patient Have a Medical Advance Directive? Yes  Type of Estate agent of [place];Living will  Copy of Healthcare Power of Attorney in Chart? No - copy requested    Medications and allergies reviewed with patient and updated if appropriate.  Patient Active Problem List   Diagnosis Date Noted  . Urinary incontinence in female 11/09/2012  . Osteopenia 11/09/2012  . Vitamin D deficiency 11/09/2012  . ADD (attention deficit disorder) 02/24/2012  . Seizure disorder (HCC)   . Hypertension   . Memory disturbance 12/08/2011  . Change in bowel habits   . GERD (gastroesophageal reflux disease) 05/28/2011  . Glaucoma 05/28/2011  . Dermatomyositis (HCC) 05/28/2011    Current Outpatient Prescriptions on File Prior to Visit  Medication Sig Dispense Refill  . amLODipine (NORVASC) 2.5 MG tablet Take 1 tablet by mouth daily. ---must keep appt for further refills. 30 tablet 2  . aspirin 81 MG tablet Take 81 mg by mouth  daily.    . citalopram (CELEXA) 20 MG tablet Take 1 tablet (20 mg total) by mouth daily. 30 tablet 3  . fish oil-omega-3 fatty acids 1000 MG capsule Take 1 g by mouth daily.    Marland Kitchen latanoprost (XALATAN) 0.005 % ophthalmic solution   1  . losartan (COZAAR) 100 MG tablet Take 0.5 tablets (50 mg total) by mouth daily. 30 tablet 1  . Multiple Vitamins-Calcium (ONE-A-DAY WOMENS PO) Take by mouth daily.    . Probiotic Product (ALIGN) 4  MG CAPS Take by mouth every morning. Reported on 08/02/2015     No current facility-administered medications on file prior to visit.     Past Medical History:  Diagnosis Date  . Abnormal EEG 01/14/12   started on Keppra  . Dermatomyositis (HCC)    remission on pred until 2008, rheum at [facility]  . GERD (gastroesophageal reflux disease)    agravated with Fosamax  . Glaucoma   . Hypertension 2013  . Memory loss   . PMB (postmenopausal bleeding) 08/2003  . Seizures (HCC)   . Status post dilation of esophageal narrowing   . Urinary incontinence, mixed 07/01/11   Fitted for 2.75 " Ring Pessary with support  . Vitamin D deficiency disease     Past Surgical History:  Procedure Laterality Date  . COLONOSCOPY  09/25/04   Dr. Loreta Ave  . COLONOSCOPY  02/2009  . ENDOMETRIAL BIOPSY  08/24/03   weak proliferative endo, SHGM neg.  Marland Kitchen ESOPHAGOGASTRODUODENOSCOPY ENDOSCOPY  04/29/04   anemia without cause  . NO PAST SURGERIES      Social History   Social History  . Marital status: Married    Spouse name: [name]  . Number of children: 1  . Years of education: hs   Occupational History  . housewife    Social History Main Topics  . Smoking status: Former Smoker    Packs/day: 1.00    Years: 15.00    Types: Cigarettes    Quit date: 02/04/1976  . Smokeless tobacco: Never Used  . Alcohol use 4.2 oz/week    7 Glasses of wine per week     Comment: 1 glass of wine per day  . Drug use: No  . Sexual activity: Yes    Partners: Male    Birth control/ protection: Post-menopausal   Other Topics Concern  . None   Social History Narrative   Married, lives with spouse [name]).   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   Patient has one child.   Patient is right-handed.   Patient drinks 2-3 cups of coffee daily and maybe half cup at night.                   Family History  Problem Relation Age of Onset  . Dementia Father      died age 36, otherwise healthy  . Hypertension Mother   . Heart disease Maternal Grandfather   . Heart disease Maternal Grandmother   . Seizures Neg Hx         Review of Systems  Constitutional: Negative for fever, malaise/fatigue and weight loss.  HENT: Negative for congestion and sore throat.   Eyes:       Negative for visual changes  Respiratory: Negative for cough and shortness of breath.   Cardiovascular: Negative for chest pain, palpitations and leg swelling.  Gastrointestinal: Negative for blood in stool, constipation, diarrhea and heartburn.  Genitourinary: Negative for dysuria, frequency and urgency.  Musculoskeletal: Negative for falls, joint pain and myalgias.  Skin: Negative for rash.  Neurological: Negative for dizziness, sensory change and headaches.  Endo/Heme/Allergies: Does not bruise/bleed easily.  Psychiatric/Behavioral: Negative for depression, substance abuse and suicidal ideas. The patient is not nervous/anxious.     Objective:   Vitals:   02/05/16 0859  BP: 134/66  Pulse: (!) 54  Temp: 97.8 F (36.6 C)    Body mass index is 21.09 kg/m.   Physical Examination:  Physical Exam  Constitutional: She is oriented to person, place, and time and well-developed, well-nourished, and in no distress. No distress.  HENT:  Right Ear: External ear normal.  Left Ear: External ear normal.  Nose: Nose normal.  Mouth/Throat: Oropharynx is clear and moist. No oropharyngeal exudate.  Eyes: Conjunctivae and EOM are normal. Pupils are equal, round, and reactive to light. No scleral icterus.  Neck: Normal range of motion. Neck supple. No thyromegaly present.  Cardiovascular: Normal rate, normal heart sounds and intact distal pulses.   Pulmonary/Chest: Effort normal and breath sounds normal. She exhibits no tenderness.  Abdominal: Soft. Bowel sounds are normal. She exhibits no distension. There is no tenderness.  Musculoskeletal: Normal range of motion. She  exhibits no edema or tenderness.  Lymphadenopathy:    She has no cervical adenopathy.  Neurological: She is alert and oriented to person, place, and time. She has normal reflexes. No cranial nerve deficit. Gait normal.  Skin: Skin is warm and dry.  Psychiatric: Affect and judgment normal.  Vitals reviewed.   ASSESSMENT and PLAN:  Charmagne was seen today for establish care.  Diagnoses and all orders for this visit:  Encounter for preventative adult health care exam with abnormal findings  Vitamin D deficiency -     Discontinue: cholecalciferol (VITAMIN D) 1000 units tablet; Take 1 tablet (1,000 Units total) by mouth 2 (two) times daily. -     calcium carbonate (OS-CAL) 600 MG tablet; Take 1 tablet (600 mg total) by mouth 2 (two) times daily. -     Cholecalciferol (VITAMIN D3) 400 units CAPS; Take 1 tablet by mouth 2 (two) times daily.  Essential hypertension  Leukocytosis, unspecified type -     CBC w/Diff; Future  Seizure disorder (HCC)  Osteopenia, unspecified location  Memory disturbance  Gastroesophageal reflux disease without esophagitis    Hypertension Controlled.  Continue amlodipine 2.5mg  and losartan 50mg .  Seizure disorder (HCC) No seizure activity in over 1year.  Last OV 07/2015 Per Butch Penny, NP-C (with [facility]) "The patient will remain on Keppra for seizure prevention. Her memory score has slightly declined. We have discussed Namenda but the patient would like to continue monitoring her memory at this time. She states that her family has not noticed any changes in her memory. I advised that we will see her back in 6 months and we'll repeat the MMSE at that time. She verbalized understanding and is amenable to this plan."  Osteopenia Last dexa scan 01/2016: normal  Memory disturbance aricept stopped by patient.  Last OV 07/2015 Per Butch Penny, NP-C (with [facility]) "The patient will remain on Keppra for  seizure prevention. Her memory score has slightly declined. We have discussed Namenda but the patient would like to continue monitoring her memory at this time. She states that her family has not noticed any changes in her memory. I advised that we will see her back in 6 months and we'll repeat the MMSE at that  time. She verbalized understanding and is amenable to this plan."  Done 11/2015 by Eli Phillips, PA-C (with [facility]): Brief Cognitive testing done with the [facility] Cognitive Assessement), a 30-point test of global cognitive function which is similar to the MMSE, but more sensitive for detecting MCI. she scores 18/30. "Likely has crossed or is crossing over into mild dementia of the Alzheimer's type with lack of atypical presenting signs or symptoms/features. Provided reassurance of her family's love and concern for her and commitment to provide care in all circumstances. I spoke with her privately about her concerns and worries; also conveyed this to her husband who is deeply committed to seeing her through this. Continue exercise daily- walking etc. May benefit from additional psychotherapy- I will contact her therapist, Brayton Layman about this."     Vitamin D deficiency Normal vitamin D level 12/2015. Decreased Vitamin D dose to 2000IU daily  GERD (gastroesophageal reflux disease) Stable. No medication at this time    Recent Results (from the past 2160 hour(s))  Comprehensive metabolic panel     Status: None   Collection Time: 01/02/16  5:10 PM  Result Value Ref Range   Sodium 139 135 - 145 mEq/L   Potassium 4.2 3.5 - 5.1 mEq/L   Chloride 103 96 - 112 mEq/L   CO2 29 19 - 32 mEq/L   Glucose, Bld 82 70 - 99 mg/dL   BUN 21 6 - 23 mg/dL   Creatinine, Ser 1.61 0.40 - 1.20 mg/dL   Total Bilirubin 0.6 0.2 - 1.2 mg/dL   Alkaline Phosphatase 51 39 - 117 U/L   AST 26 0 - 37 U/L   ALT 18 0 - 35 U/L   Total Protein 7.6 6.0 - 8.3 g/dL   Albumin  4.5 3.5 - 5.2 g/dL   Calcium 9.4 8.4 - 09.6 mg/dL   GFR 04.54 >09.81 mL/min  Lipid panel     Status: Abnormal   Collection Time: 01/02/16  5:10 PM  Result Value Ref Range   Cholesterol 213 (H) 0 - 200 mg/dL    Comment: ATP III Classification       Desirable:  < 200 mg/dL               Borderline High:  200 - 239 mg/dL          High:  > = 191 mg/dL   Triglycerides 478.2 0.0 - 149.0 mg/dL    Comment: Normal:  <956 mg/dLBorderline High:  150 - 199 mg/dL   HDL 21.30 >86.57 mg/dL   VLDL 84.6 0.0 - 96.2 mg/dL   LDL Cholesterol 952 (H) 0 - 99 mg/dL   Total CHOL/HDL Ratio 4     Comment:                Men          Women1/2 Average Risk     3.4          3.3Average Risk          5.0          4.42X Average Risk          9.6          7.13X Average Risk          15.0          11.0                       NonHDL 152.61  Comment: NOTE:  Non-HDL goal should be 30 mg/dL higher than patient's LDL goal (i.e. LDL goal of < 70 mg/dL, would have non-HDL goal of < 100 mg/dL)  Vitamin D 0,861,25 dihydroxy     Status: None   Collection Time: 01/02/16  5:10 PM  Result Value Ref Range   Vitamin D 1, 25 (OH)2 Total 72 18 - 72 pg/mL   Vitamin D3 1, 25 (OH)2 72 pg/mL   Vitamin D2 1, 25 (OH)2 <8 pg/mL    Comment: Vitamin D3, 1,25(OH)2 indicates both endogenous production and supplementation.  Vitamin D2, 1,25(OH)2 is an indicator of exogeous sources, such as diet or supplementation.  Interpretation and therapy are based on measurement of Vitamin D,1,25(OH)2, Total. This test was developed and its analytical performance characteristics have been determined by [facility], [place], [place]. It has not been cleared or approved by the FDA. This assay has been validated pursuant to the CLIA regulations and is used for clinical purposes.     Follow up: Return in about 6 months (around 08/04/2016) for HTN and CBC review.  [name], NP

## 2016-02-05 NOTE — Assessment & Plan Note (Signed)
Controlled.  Continue amlodipine 2.5mg  and losartan 50mg .

## 2016-02-05 NOTE — Assessment & Plan Note (Addendum)
No seizure activity in over 1year.  Last OV 07/2015 Per Butch PennyMegan Millikan, NP-C ([facility]) "The patient will remain on Keppra for seizure prevention. Her memory score has slightly declined. We have discussed Namenda but the patient would like to continue monitoring her memory at this time. She states that her family has not noticed any changes in her memory. I advised that we will see her back in 6 months and we'll repeat the MMSE at that time. She verbalized understanding and is amenable to this plan."

## 2016-02-05 NOTE — Progress Notes (Signed)
Pre visit review using our clinic review tool, if applicable. No additional management support is needed unless otherwise documented below in the visit note. 

## 2016-02-05 NOTE — Assessment & Plan Note (Signed)
Stable. No medication at this time

## 2016-02-05 NOTE — Patient Instructions (Addendum)
Complete current dose of vitamin D 50,000IU every other week, then start Vitamin D OTC 400IU twice a day.  For hyperlipidemia: we will monitor at this time. Continue heart healthy diet and daily exercise. Will repeat lipid panel (fasting) in November 2018.  Please inquire with insurance if 90days prescriptions will be covered.  Return to lab at your convenience for repeat CBC.

## 2016-02-07 ENCOUNTER — Encounter: Payer: Self-pay | Admitting: Nurse Practitioner

## 2016-02-07 ENCOUNTER — Encounter: Payer: Self-pay | Admitting: Adult Health

## 2016-02-07 ENCOUNTER — Ambulatory Visit (INDEPENDENT_AMBULATORY_CARE_PROVIDER_SITE_OTHER): Payer: Medicare Other | Admitting: Adult Health

## 2016-02-07 VITALS — BP 151/71 | HR 58 | Resp 20 | Ht 60.0 in | Wt 106.0 lb

## 2016-02-07 DIAGNOSIS — R413 Other amnesia: Secondary | ICD-10-CM

## 2016-02-07 DIAGNOSIS — G40909 Epilepsy, unspecified, not intractable, without status epilepticus: Secondary | ICD-10-CM | POA: Diagnosis not present

## 2016-02-07 MED ORDER — LEVETIRACETAM 250 MG PO TABS: 250.0000 mg | ORAL_TABLET | Freq: Two times a day (BID) | ORAL | 11 refills | Status: DC

## 2016-02-07 MED ORDER — MEMANTINE HCL 28 X 5 MG & 21 X 10 MG PO TABS: ORAL_TABLET | ORAL | 0 refills | Status: DC

## 2016-02-07 NOTE — Progress Notes (Signed)
PATIENT: April Chung DOB: 04-30-1945  REASON FOR VISIT: follow up- seizures, memory disturbance HISTORY FROM: patient  HISTORY OF PRESENT ILLNESS: [name] is a 71 year old female with a history of seizures and memory disturbance. She returns today for follow-up. She is currently taking Keppra 250 mg twice a day. She denies any seizure events. She continues to be able to complete all ADLs independently. She operates a Librarian, academic without difficulty. Denies getting lost point to familiar places although she does report that she got "turnaround" when coming to our office. Denies any changes in her gait, balance or mood. She denies any changes with her memory. Reports that her husband feels that it has remained stable. She is able to prepare her own meals without difficulty. She manages her checkbook without difficulty difficulty. Denies any trouble sleeping. She reports that her dad had Alzheimer's disease. She returns today for an evaluation.   HISTORY 08/02/15: [name] is a 71 year old female with a history of seizures and memory disturbance. She returns today for follow-up. She is currently taking Keppra 250 mg twice a day. She denies any seizure events. She reports that her memory has remained stable. She states that her husband actually feels that it may have improved some. She is able to complete all ADLs independently. She operates a Librarian, academic without difficulty. She does not feel that she's had to give up anything due to her memory. She returns today for an evaluation.  HISTORY 08/02/14 (MM): [name] is a 71 year old female with a history of seizure disorder and memory disturbance. The patient returns today for follow-up. The patient is currently taking Keppra 250 mg twice a day. She is tolerating this medication well. The patient denies any seizure episodes. She is able to operate a motor vehicle without difficulty. She is able to complete all ADLs independently. The patient states  that her memory has remained the same. Denies having to give up anything due to her memory. She states that she often makes lists in order not to forget anything. For example she always makes a grocery list. Denies any new medical issues. She returns today for an evaluation.  HISTORY 07/29/2013 (WILLIS): [name] is a 71 year old right-handed white female with a history of a seizure disorder and a mild memory disturbance. The patient has done relatively well with her seizures, she has had no episodes in greater than one year. The patient is operating motor vehicle, doing well with this. She is on low-dose Keppra taking 250 mg twice daily. She denies any side effects on the medication. The memory remains a mild issues, not truly progressive since last seen. She reports no other new medical issues that have come up since last seen.    REVIEW OF SYSTEMS: Out of a complete 14 system review of symptoms, the patient complains only of the following symptoms, and all other reviewed systems are negative.  See history of present illness  ALLERGIES: Allergies  Allergen Reactions  . Plaquenil [Hydroxychloroquine Sulfate]   . Hydroxychloroquine Rash    HOME MEDICATIONS: Outpatient Medications Prior to Visit  Medication Sig Dispense Refill  . amLODipine (NORVASC) 2.5 MG tablet Take 1 tablet by mouth daily. ---must keep appt for further refills. 30 tablet 2  . aspirin 81 MG tablet Take 81 mg by mouth daily.    . calcium carbonate (OS-CAL) 600 MG tablet Take 1 tablet (600 mg total) by mouth 2 (two) times daily. 60 tablet 0  . Cholecalciferol (VITAMIN D3) 400  units CAPS Take 1 tablet by mouth 2 (two) times daily. 150 capsule 0  . citalopram (CELEXA) 20 MG tablet Take 1 tablet (20 mg total) by mouth daily. 30 tablet 3  . fish oil-omega-3 fatty acids 1000 MG capsule Take 1 g by mouth daily.    Marland Kitchen latanoprost (XALATAN) 0.005 % ophthalmic solution   1  . levETIRAcetam (KEPPRA) 250 MG tablet Take 1 tablet (250  mg total) by mouth 2 (two) times daily. 60 tablet 0  . losartan (COZAAR) 100 MG tablet Take 0.5 tablets (50 mg total) by mouth daily. 30 tablet 1  . Multiple Vitamins-Calcium (ONE-A-DAY WOMENS PO) Take by mouth daily.    . Probiotic Product (ALIGN) 4 MG CAPS Take by mouth every morning. Reported on 08/02/2015     No facility-administered medications prior to visit.     PAST MEDICAL HISTORY: Past Medical History:  Diagnosis Date  . Abnormal EEG 01/14/12   started on Keppra  . Dermatomyositis (HCC)    remission on pred until 2008, rheum at [facility]  . GERD (gastroesophageal reflux disease)    agravated with Fosamax  . Glaucoma   . Hypertension 2013  . Memory loss   . PMB (postmenopausal bleeding) 08/2003  . Seizures (HCC)   . Status post dilation of esophageal narrowing   . Urinary incontinence, mixed 07/01/11   Fitted for 2.75 " Ring Pessary with support  . Vitamin D deficiency disease     PAST SURGICAL HISTORY: Past Surgical History:  Procedure Laterality Date  . COLONOSCOPY  09/25/04   Dr. Loreta Ave  . COLONOSCOPY  02/2009  . ENDOMETRIAL BIOPSY  08/24/03   weak proliferative endo, [facility] neg.  Marland Kitchen ESOPHAGOGASTRODUODENOSCOPY ENDOSCOPY  04/29/04   anemia without cause  . NO PAST SURGERIES      FAMILY HISTORY: Family History  Problem Relation Age of Onset  . Dementia Father     died age 57, otherwise healthy  . Hypertension Mother   . Heart disease Maternal Grandfather   . Heart disease Maternal Grandmother   . Seizures Neg Hx     SOCIAL HISTORY: Social History   Social History  . Marital status: Married    Spouse name: April Chung  . Number of children: 1  . Years of education: hs   Occupational History  . housewife    Social History Main Topics  . Smoking status: Former Smoker    Packs/day: 1.00    Years: 15.00    Types: Cigarettes    Quit date: 02/04/1976  . Smokeless tobacco: Never Used  . Alcohol use 4.2 oz/week    7 Glasses of wine per week     Comment: 1  glass of wine per day  . Drug use: No  . Sexual activity: Yes    Partners: Male    Birth control/ protection: Post-menopausal   Other Topics Concern  . Not on file   Social History Narrative   Married, lives with spouse April Chung).   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   Patient has one child.   Patient is right-handed.   Patient drinks 2-3 cups of coffee daily and maybe half cup at night.                     PHYSICAL EXAM  Vitals:   02/07/16 1126  BP: (!) 151/71  Pulse: (!) 58  Resp: 20  Weight: 106 lb (48.1  kg)  Height: 5' (1.524 m)   Body mass index is 20.7 kg/m. MMSE - Mini Mental State Exam 02/07/2016 08/02/2015  Orientation to time 1 2  Orientation to Place 4 3  Registration 3 3  Attention/ Calculation 2 4  Recall 2 2  Language- name 2 objects 2 2  Language- repeat 1 1  Language- follow 3 step command 2 3  Language- read & follow direction 1 1  Write a sentence 1 1  Copy design 0 1  Total score 19 23    Generalized: Well developed, in no acute distress   Neurological examination  Mentation: Alert oriented to time, place, history taking. Follows all commands speech and language fluent Cranial nerve II-XII: Pupils were equal round reactive to light. Extraocular movements were full, visual field were full on confrontational test. Facial sensation and strength were normal. Uvula tongue midline. Head turning and shoulder shrug  were normal and symmetric. Motor: The motor testing reveals 5 over 5 strength of all 4 extremities. Good symmetric motor tone is noted throughout.  Sensory: Sensory testing is intact to soft touch on all 4 extremities. No evidence of extinction is noted.  Coordination: Cerebellar testing reveals good finger-nose-finger and heel-to-shin bilaterally.  Gait and station: Gait is normal. Tandem gait is normal. Romberg is negative. No drift is seen.  Reflexes: Deep tendon  reflexes are symmetric and normal bilaterally.   DIAGNOSTIC DATA (LABS, IMAGING, TESTING) - I reviewed patient records, labs, notes, testing and imaging myself where available.  Lab Results  Component Value Date   WBC 12.0 (H) 12/13/2013   HGB 14.5 12/13/2013   HCT 42.9 12/13/2013   MCV 95.2 12/13/2013   PLT 305.0 12/13/2013      Component Value Date/Time   NA 139 01/02/2016 1710   NA 136 (A) 04/17/2011   K 4.2 01/02/2016 1710   CL 103 01/02/2016 1710   CO2 29 01/02/2016 1710   GLUCOSE 82 01/02/2016 1710   BUN 21 01/02/2016 1710   BUN 12 04/17/2011   CREATININE 0.66 01/02/2016 1710   CALCIUM 9.4 01/02/2016 1710   PROT 7.6 01/02/2016 1710   ALBUMIN 4.5 01/02/2016 1710   AST 26 01/02/2016 1710   ALT 18 01/02/2016 1710   ALKPHOS 51 01/02/2016 1710   BILITOT 0.6 01/02/2016 1710   Lab Results  Component Value Date   CHOL 213 (H) 01/02/2016   HDL 60.00 01/02/2016   LDLCALC 129 (H) 01/02/2016   LDLDIRECT 129.4 08/24/2012   TRIG 119.0 01/02/2016   CHOLHDL 4 01/02/2016    Lab Results  Component Value Date   VITAMINB12 1,201 (H) 12/08/2011   Lab Results  Component Value Date   TSH 1.17 12/13/2013      ASSESSMENT AND PLAN 71 y.o. year old female  has a past medical history of Abnormal EEG (01/14/12); Dermatomyositis (HCC); GERD (gastroesophageal reflux disease); Glaucoma; Hypertension (2013); Memory loss; PMB (postmenopausal bleeding) (08/2003); Seizures (HCC); Status post dilation of esophageal narrowing; Urinary incontinence, mixed (07/01/11); and Vitamin D deficiency disease. here with:  1. Seizures 2. Memory disturbance  The patient will remain on Keppra for seizure prevention. The patient's memory score has slightly declined. I will check blood work to rule out any reversible causes of her memory disturbance. I did discuss with the patient about starting Namenda. She is amenable to this. I reviewed the potential side effects with the patient. Advised that if her  symptoms worsen or she develops new symptoms she shouldl let us know. Will follow-up  in 3 months or sooner if needed.      Butch Penny, MSN, NP-C 02/07/2016, 11:29 AM [facility] [address], [address] [place], [place] [address] [phone]

## 2016-02-07 NOTE — Patient Instructions (Signed)
Blood work today Health and safety inspectortart namenda for [facility] Energymemory Continue Keppra 250 mg BID If your symptoms worsen or you develop new symptoms please let us know.   Memantine extended release capsules What is this medicine? MEMANTINE (MEM an teen) is used to treat dementia caused by Alzheimer's disease. COMMON BRAND NAME(S): Namenda XR What should I tell my health care provider before I take this medicine? They need to know if you have any of these conditions: -difficulty passing urine -kidney disease -liver disease -seizures -an unusual or allergic reaction to memantine, other medicines, foods, dyes, or preservatives -pregnant or trying to get pregnant -breast-feeding How should I use this medicine? Take this medicine by mouth with a glass of water. Follow the directions on the prescription label. You may take this medicine with or without food. You may swallow the capsules whole or open them and sprinkle the entire contents on applesauce before swallowing. Other than sprinkling the medicine on applesauce, the capsules should be swallowed whole and not divided, chewed, or crushed. Take your doses at regular intervals. Do not take your medicine more often than directed. Continue to take your medicine even if you feel better. Do not stop taking except on the advice of your doctor or health care professional. Talk to your pediatrician regarding the use of this medicine in children. Special care may be needed. What if I miss a dose? If you miss a dose, take it as soon as you can. If it is almost time for your next dose, take only that dose. Do not take double or extra doses. If you do not take your medicine for several days, contact your health care provider. Your dose may need to be changed. What may interact with this medicine? -acetazolamide -amantadine -cimetidine -dextromethorphan -dofetilide -hydrochlorothiazide -ketamine -metformin -methazolamide -quinidine -ranitidine -sodium  bicarbonate -triamterene What should I watch for while using this medicine? Visit your doctor or health care professional for regular checks on your progress. Check with your doctor or health care professional if there is no improvement in your symptoms or if they get worse. You may get drowsy or dizzy. Do not drive, use machinery, or do anything that needs mental alertness until you know how this drug affects you. Do not stand or sit up quickly, especially if you are an older patient. This reduces the risk of dizzy or fainting spells. Alcohol can make you more drowsy and dizzy. Avoid alcoholic drinks. What side effects may I notice from receiving this medicine? Side effects that you should report to your doctor or health care professional as soon as possible: -agitation or a feeling of restlessness -allergic reactions like skin rash, itching or hives, swelling of the face, lips, or tongue -depressed mood -dizziness -hallucinations -redness, blistering, peeling or loosening of the skin, including inside the mouth -seizures -vomiting Side effects that usually do not require medical attention (report to your doctor or health care professional if they continue or are bothersome): -constipation -diarrhea -headache -nausea -trouble sleeping Where should I keep my medicine? Keep out of the reach of children. Store at room temperature between 15 degrees and 30 degrees C (59 degrees and 86 degrees F). Throw away any unused medicine after the expiration date.  2017 Elsevier/Gold Standard (2015-02-22 10:09:47)

## 2016-02-07 NOTE — Progress Notes (Signed)
I have read the note, and I agree with the clinical assessment and plan.  WILLIS,CHARLES KEITH   

## 2016-02-08 LAB — CBC WITH DIFFERENTIAL/PLATELET
BASOS: 0 %
Basophils Absolute: 0 10*3/uL (ref 0.0–0.2)
EOS (ABSOLUTE): 0.1 10*3/uL (ref 0.0–0.4)
EOS: 1 %
HEMATOCRIT: 42.4 % (ref 34.0–46.6)
Hemoglobin: 14.7 g/dL (ref 11.1–15.9)
IMMATURE GRANS (ABS): 0 10*3/uL (ref 0.0–0.1)
Immature Granulocytes: 0 %
LYMPHS: 17 %
Lymphocytes Absolute: 1.5 10*3/uL (ref 0.7–3.1)
MCH: 33.3 pg — AB (ref 26.6–33.0)
MCHC: 34.7 g/dL (ref 31.5–35.7)
MCV: 96 fL (ref 79–97)
MONOS ABS: 0.6 10*3/uL (ref 0.1–0.9)
Monocytes: 7 %
NEUTROS ABS: 6.3 10*3/uL (ref 1.4–7.0)
Neutrophils: 75 %
PLATELETS: 314 10*3/uL (ref 150–379)
RBC: 4.42 x10E6/uL (ref 3.77–5.28)
RDW: 12.4 % (ref 12.3–15.4)
WBC: 8.4 10*3/uL (ref 3.4–10.8)

## 2016-02-08 LAB — COMPREHENSIVE METABOLIC PANEL
A/G RATIO: 1.5 (ref 1.2–2.2)
ALT: 18 IU/L (ref 0–32)
AST: 26 IU/L (ref 0–40)
Albumin: 4.5 g/dL (ref 3.5–4.8)
Alkaline Phosphatase: 62 IU/L (ref 39–117)
BUN/Creatinine Ratio: 27 (ref 12–28)
BUN: 18 mg/dL (ref 8–27)
Bilirubin Total: 0.5 mg/dL (ref 0.0–1.2)
CALCIUM: 9.9 mg/dL (ref 8.7–10.3)
CO2: 23 mmol/L (ref 18–29)
Chloride: 102 mmol/L (ref 96–106)
Creatinine, Ser: 0.67 mg/dL (ref 0.57–1.00)
GFR, EST AFRICAN AMERICAN: 102 mL/min/{1.73_m2} (ref >59)
GFR, EST NON AFRICAN AMERICAN: 88 mL/min/{1.73_m2} (ref >59)
GLOBULIN, TOTAL: 3 g/dL (ref 1.5–4.5)
Glucose: 76 mg/dL (ref 65–99)
POTASSIUM: 4.3 mmol/L (ref 3.5–5.2)
Sodium: 141 mmol/L (ref 134–144)
TOTAL PROTEIN: 7.5 g/dL (ref 6.0–8.5)

## 2016-02-08 LAB — TSH: TSH: 1.85 u[IU]/mL (ref 0.450–4.500)

## 2016-02-08 LAB — VITAMIN B12: Vitamin B-12: 983 pg/mL (ref 232–1245)

## 2016-02-15 ENCOUNTER — Ambulatory Visit: Payer: Medicare Other | Admitting: Nurse Practitioner

## 2016-03-07 ENCOUNTER — Other Ambulatory Visit: Payer: Self-pay | Admitting: Adult Health

## 2016-03-07 ENCOUNTER — Other Ambulatory Visit: Payer: Self-pay | Admitting: Nurse Practitioner

## 2016-03-07 DIAGNOSIS — E559 Vitamin D deficiency, unspecified: Secondary | ICD-10-CM

## 2016-03-07 MED ORDER — VITAMIN D3 10 MCG (400 UNIT) PO CAPS: 1.0000 | ORAL_CAPSULE | Freq: Two times a day (BID) | ORAL | 2 refills | Status: DC

## 2016-03-07 NOTE — Telephone Encounter (Signed)
Do not change prescription

## 2016-03-07 NOTE — Telephone Encounter (Signed)
Pt left msg on triage stating she [facility] on her Calcium & vitamin D.../lmb

## 2016-03-07 NOTE — Telephone Encounter (Signed)
Routing to charlotte---you did not give refills with last rx---are you ok with refilling---please advise, thanks

## 2016-03-07 NOTE — Telephone Encounter (Signed)
Rx for Calcium [facility] to [facility].   Charlotte please check this, pharmacy req Vit D 1000 softgel 100 NB [id number], this is different from what we have on field. Please advise.

## 2016-03-10 ENCOUNTER — Telehealth: Payer: Self-pay | Admitting: Adult Health

## 2016-03-10 MED ORDER — MEMANTINE HCL 10 MG PO TABS: 10.0000 mg | ORAL_TABLET | Freq: Two times a day (BID) | ORAL | 5 refills | Status: DC

## 2016-03-10 NOTE — Telephone Encounter (Signed)
Pt tolerating namenda titration pack.  New prescription for 10mg  po bid #60 with [facility] refills to [facility].

## 2016-03-10 NOTE — Telephone Encounter (Signed)
Patient's husband calling for new Rx for Namenda [facility]. The patient has almost finished titration pack and is tolerating it well and seems to be helping her.

## 2016-03-10 NOTE — Addendum Note (Signed)
Addended byHermenia Fiscal: Heiley Shaikh on: 03/10/2016 02:19 PM   Modules accepted: Orders

## 2016-03-24 ENCOUNTER — Other Ambulatory Visit: Payer: Self-pay | Admitting: Nurse Practitioner

## 2016-03-24 ENCOUNTER — Other Ambulatory Visit: Payer: Self-pay

## 2016-03-24 DIAGNOSIS — I10 Essential (primary) hypertension: Secondary | ICD-10-CM

## 2016-03-24 NOTE — Telephone Encounter (Signed)
Medication refill request: Medroxyprogesterone Last AEX:  01/22/16 PG Next AEX: 02/06/17 PG Last MMG (if hormonal medication request): 01/30/16 BIRADS1, Density D, Solis Refill authorized: 12/25/15 #30 0R. Please advise. Thank you. (Discontinued on 01/22/16)

## 2016-03-31 ENCOUNTER — Other Ambulatory Visit: Payer: Self-pay | Admitting: Nurse Practitioner

## 2016-03-31 DIAGNOSIS — F339 Major depressive disorder, recurrent, unspecified: Secondary | ICD-10-CM

## 2016-03-31 DIAGNOSIS — I10 Essential (primary) hypertension: Secondary | ICD-10-CM

## 2016-04-01 ENCOUNTER — Other Ambulatory Visit: Payer: Self-pay

## 2016-04-01 NOTE — Telephone Encounter (Signed)
Medication refill request: Medroxyprogesterone 2.5mg  Last AEX:  01/22/16 PG Next AEX: 02/06/17 PG Last MMG (if hormonal medication request): 01/30/16 BIRADS1, Density D, Solis Refill authorized: 12/25/15 #30 0R. Shows discontinued on 01/22/16 by provider. Please advise. Thank you.

## 2016-04-01 NOTE — Telephone Encounter (Signed)
This patient is now off estrogen so therefore does not need progesterone - information per last AEX.

## 2016-04-08 DIAGNOSIS — H04123 Dry eye syndrome of bilateral lacrimal glands: Secondary | ICD-10-CM | POA: Diagnosis not present

## 2016-05-07 ENCOUNTER — Encounter: Payer: Self-pay | Admitting: Adult Health

## 2016-05-07 ENCOUNTER — Ambulatory Visit (INDEPENDENT_AMBULATORY_CARE_PROVIDER_SITE_OTHER): Payer: Medicare Other | Admitting: Adult Health

## 2016-05-07 VITALS — BP 160/72 | HR 48 | Resp 14 | Ht 60.0 in | Wt 109.0 lb

## 2016-05-07 DIAGNOSIS — R569 Unspecified convulsions: Secondary | ICD-10-CM

## 2016-05-07 DIAGNOSIS — R413 Other amnesia: Secondary | ICD-10-CM | POA: Diagnosis not present

## 2016-05-07 NOTE — Progress Notes (Signed)
I have read the note, and I agree with the clinical assessment and plan.  April Chung KEITH   

## 2016-05-07 NOTE — Progress Notes (Signed)
PATIENT: April Chung DOB: Jun 04, 1945  REASON FOR VISIT: follow up- memory HISTORY FROM: patient and husband  HISTORY OF PRESENT ILLNESS: [name] is a 71 year old female with a history of memory disturbance. She returns today for follow-up. At the last visit her memory score has slightly declined and for that reason we added on Namenda. She states that she is tolerating Namenda and Aricept well. She feels that her memory has stayed the same. Her husband is with her today and he feels that her memory actually improved with Namenda. She is able to complete all ADLs independently. She operates a Librarian, academic without difficulty. She is able to compare meals without difficulty. Denies any trouble sleeping. Denies hallucinations. Overall she feels that things have remained stable. She remains on Keppra 250 mg twice a day -denies any seizure events. She returns today for an evaluation.  HISTORY 02/07/16: [name] is a 71 year old female with a history of seizures and memory disturbance. She returns today for follow-up. She is currently taking Keppra 250 mg twice a day. She denies any seizure events. She continues to be able to complete all ADLs independently. She operates a Librarian, academic without difficulty. Denies getting lost point to familiar places although she does report that she got "turnaround" when coming to our office. Denies any changes in her gait, balance or mood. She denies any changes with her memory. Reports that her husband feels that it has remained stable. She is able to prepare her own meals without difficulty. She manages her checkbook without difficulty difficulty. Denies any trouble sleeping. She reports that her dad had Alzheimer's disease. She returns today for an evaluation.  REVIEW OF SYSTEMS: Out of a complete 14 system review of symptoms, the patient complains only of the following symptoms, and all other reviewed systems are negative.  See history of present  illness  ALLERGIES: Allergies  Allergen Reactions  . Plaquenil [Hydroxychloroquine Sulfate]   . Hydroxychloroquine Rash    HOME MEDICATIONS: Outpatient Medications Prior to Visit  Medication Sig Dispense Refill  . amLODipine (NORVASC) 2.5 MG tablet TAKE 1 TABLET BY MOUTH EVERY DAY 30 tablet 5  . aspirin 81 MG tablet Take 81 mg by mouth daily.    . Cholecalciferol (VITAMIN D3) 400 units CAPS Take 1 tablet by mouth 2 (two) times daily. 150 capsule 2  . citalopram (CELEXA) 20 MG tablet TAKE 1 TABLET BY MOUTH EVERY DAY 30 tablet 5  . fish oil-omega-3 fatty acids 1000 MG capsule Take 1 g by mouth daily.    Marland Kitchen GNP CALCIUM 600 MG TABS tablet TAKE 1 TABLET BY MOUTH 2 TIMES DAILY 60 tablet 5  . latanoprost (XALATAN) 0.005 % ophthalmic solution   1  . levETIRAcetam (KEPPRA) 250 MG tablet Take 1 tablet (250 mg total) by mouth 2 (two) times daily. 60 tablet 11  . losartan (COZAAR) 100 MG tablet TAKE 1/2 TABLET BY MOUTH EVERY DAY 30 tablet 1  . memantine (NAMENDA TITRATION PAK) tablet pack 5 mg/day for =1 week; 5 mg twice daily for =1 week; 15 mg/day given in 5 mg and 10 mg separated doses for =1 week; then 10 mg twice daily 49 tablet 0  . memantine (NAMENDA) 10 MG tablet Take 1 tablet (10 mg total) by mouth 2 (two) times daily. 60 tablet 5  . Multiple Vitamins-Calcium (ONE-A-DAY WOMENS PO) Take by mouth daily.    . Probiotic Product (ALIGN) 4 MG CAPS Take by mouth every morning. Reported on 08/02/2015  No facility-administered medications prior to visit.     PAST MEDICAL HISTORY: Past Medical History:  Diagnosis Date  . Abnormal EEG 01/14/12   started on Keppra  . Dermatomyositis (HCC)    remission on pred until 2008, rheum at [facility]  . GERD (gastroesophageal reflux disease)    agravated with Fosamax  . Glaucoma   . Hypertension 2013  . Memory loss   . PMB (postmenopausal bleeding) 08/2003  . Seizures (HCC)   . Status post dilation of esophageal narrowing   . Urinary  incontinence, mixed 07/01/11   Fitted for 2.75 " Ring Pessary with support  . Vitamin D deficiency disease     PAST SURGICAL HISTORY: Past Surgical History:  Procedure Laterality Date  . COLONOSCOPY  09/25/04   Dr. Loreta Ave  . COLONOSCOPY  02/2009  . ENDOMETRIAL BIOPSY  08/24/03   weak proliferative endo, SHGM neg.  Marland Kitchen ESOPHAGOGASTRODUODENOSCOPY ENDOSCOPY  04/29/04   anemia without cause  . NO PAST SURGERIES      FAMILY HISTORY: Family History  Problem Relation Age of Onset  . Dementia Father     died age 55, otherwise healthy  . Hypertension Mother   . Heart disease Maternal Grandfather   . Heart disease Maternal Grandmother   . Seizures Neg Hx     SOCIAL HISTORY: Social History   Social History  . Marital status: Married    Spouse name: [name]  . Number of children: 1  . Years of education: hs   Occupational History  . housewife    Social History Main Topics  . Smoking status: Former Smoker    Packs/day: 1.00    Years: 15.00    Types: Cigarettes    Quit date: 02/04/1976  . Smokeless tobacco: Never Used  . Alcohol use 4.2 oz/week    7 Glasses of wine per week     Comment: 1 glass of wine per day  . Drug use: No  . Sexual activity: Yes    Partners: Male    Birth control/ protection: Post-menopausal   Other Topics Concern  . Not on file   Social History Narrative   Married, lives with spouse [name]).   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   Patient has one child.   Patient is right-handed.   Patient drinks 2-3 cups of coffee daily and maybe half cup at night.                     PHYSICAL EXAM  Vitals:   05/07/16 1308  BP: (!) 160/72  Pulse: (!) 48  Resp: 14  Weight: 109 lb (49.4 kg)  Height: 5' (1.524 m)   Body mass index is 21.29 kg/m.  Generalized: Well developed, in no acute distress   Neurological examination  Mentation: Alert oriented to time, place, history taking.  Follows all commands speech and language fluent Cranial nerve II-XII: Pupils were equal round reactive to light. Extraocular movements were full, visual field were full on confrontational test. Facial sensation and strength were normal. Uvula tongue midline. Head turning and shoulder shrug  were normal and symmetric. Motor: The motor testing reveals 5 over 5 strength of all 4 extremities. Good symmetric motor tone is noted throughout.  Sensory: Sensory testing is intact to soft touch on all 4 extremities. No evidence of extinction is noted.  Coordination: Cerebellar testing reveals good finger-nose-finger and heel-to-shin bilaterally.  Gait and station:  Gait is normal. Tandem gait is slightly unsteady. Romberg is negative. No drift is seen.  Reflexes: Deep tendon reflexes are symmetric and normal bilaterally.   DIAGNOSTIC DATA (LABS, IMAGING, TESTING) - I reviewed patient records, labs, notes, testing and imaging myself where available.  Lab Results  Component Value Date   WBC 8.4 02/07/2016   HGB 14.5 12/13/2013   HCT 42.4 02/07/2016   MCV 96 02/07/2016   PLT 314 02/07/2016      Component Value Date/Time   NA 141 02/07/2016 1256   K 4.3 02/07/2016 1256   CL 102 02/07/2016 1256   CO2 23 02/07/2016 1256   GLUCOSE 76 02/07/2016 1256   GLUCOSE 82 01/02/2016 1710   BUN 18 02/07/2016 1256   CREATININE 0.67 02/07/2016 1256   CALCIUM 9.9 02/07/2016 1256   PROT 7.5 02/07/2016 1256   ALBUMIN 4.5 02/07/2016 1256   AST 26 02/07/2016 1256   ALT 18 02/07/2016 1256   ALKPHOS 62 02/07/2016 1256   BILITOT 0.5 02/07/2016 1256   GFRNONAA 88 02/07/2016 1256   GFRAA 102 02/07/2016 1256   Lab Results  Component Value Date   CHOL 213 (H) 01/02/2016   HDL 60.00 01/02/2016   LDLCALC 129 (H) 01/02/2016   LDLDIRECT 129.4 08/24/2012   TRIG 119.0 01/02/2016   CHOLHDL 4 01/02/2016   No results found for: HGBA1C Lab Results  Component Value Date   VITAMINB12 983 02/07/2016   Lab Results   Component Value Date   TSH 1.850 02/07/2016      ASSESSMENT AND PLAN 71 y.o. year old female  has a past medical history of Abnormal EEG (01/14/12); Dermatomyositis (HCC); GERD (gastroesophageal reflux disease); Glaucoma; Hypertension (2013); Memory loss; PMB (postmenopausal bleeding) (08/2003); Seizures (HCC); Status post dilation of esophageal narrowing; Urinary incontinence, mixed (07/01/11); and Vitamin D deficiency disease. here with :  1. Memory disturbance 2. Seizures  The patient is doing well on Namenda and Aricept. She will continue on these medications. She will continue on Keppra 250 mg twice a day for seizures. Advised that if her symptoms worsen or she develops new symptoms she should let us know. She will follow-up in 6 months with Dr. Anne Hahn.   Butch Penny, MSN, NP-C 05/07/2016, 1:18 PM [facility] [address] [place], [place] [address] [phone]

## 2016-05-07 NOTE — Patient Instructions (Signed)
Continue Namenda and Aricept If your symptoms worsen or you develop new symptoms please let us know.   

## 2016-06-03 DIAGNOSIS — G3184 Mild cognitive impairment, so stated: Secondary | ICD-10-CM | POA: Diagnosis not present

## 2016-06-03 DIAGNOSIS — F4323 Adjustment disorder with mixed anxiety and depressed mood: Secondary | ICD-10-CM | POA: Diagnosis not present

## 2016-07-12 ENCOUNTER — Emergency Department (HOSPITAL_COMMUNITY): Payer: Medicare Other

## 2016-07-12 ENCOUNTER — Encounter (HOSPITAL_COMMUNITY): Payer: Self-pay | Admitting: Emergency Medicine

## 2016-07-12 ENCOUNTER — Emergency Department (HOSPITAL_COMMUNITY)
Admission: EM | Admit: 2016-07-12 | Discharge: 2016-07-12 | Disposition: A | Discharge status: Home / Self Care | Payer: Medicare Other | Attending: Emergency Medicine | Admitting: Emergency Medicine

## 2016-07-12 DIAGNOSIS — Z87891 Personal history of nicotine dependence: Secondary | ICD-10-CM | POA: Insufficient documentation

## 2016-07-12 DIAGNOSIS — R55 Syncope and collapse: Secondary | ICD-10-CM

## 2016-07-12 DIAGNOSIS — E871 Hypo-osmolality and hyponatremia: Secondary | ICD-10-CM

## 2016-07-12 DIAGNOSIS — I499 Cardiac arrhythmia, unspecified: Secondary | ICD-10-CM | POA: Diagnosis not present

## 2016-07-12 DIAGNOSIS — I10 Essential (primary) hypertension: Secondary | ICD-10-CM | POA: Diagnosis not present

## 2016-07-12 DIAGNOSIS — R001 Bradycardia, unspecified: Secondary | ICD-10-CM | POA: Diagnosis not present

## 2016-07-12 LAB — CBC WITH DIFFERENTIAL/PLATELET
Basophils Absolute: 0 10*3/uL (ref 0.0–0.1)
Basophils Relative: 0 %
Eosinophils Absolute: 0 10*3/uL (ref 0.0–0.7)
Eosinophils Relative: 0 %
HEMATOCRIT: 38.2 % (ref 36.0–46.0)
Hemoglobin: 13.3 g/dL (ref 12.0–15.0)
LYMPHS ABS: 0.3 10*3/uL — AB (ref 0.7–4.0)
LYMPHS PCT: 7 %
MCH: 31.8 pg (ref 26.0–34.0)
MCHC: 34.8 g/dL (ref 30.0–36.0)
MCV: 91.4 fL (ref 78.0–100.0)
MONO ABS: 0.5 10*3/uL (ref 0.1–1.0)
Monocytes Relative: 10 %
NEUTROS ABS: 4.1 10*3/uL (ref 1.7–7.7)
Neutrophils Relative %: 83 %
Platelets: 176 10*3/uL (ref 150–400)
RBC: 4.18 MIL/uL (ref 3.87–5.11)
RDW: 12.1 % (ref 11.5–15.5)
WBC: 4.9 10*3/uL (ref 4.0–10.5)

## 2016-07-12 LAB — URINALYSIS, ROUTINE W REFLEX MICROSCOPIC
Bilirubin Urine: NEGATIVE
GLUCOSE, UA: NEGATIVE mg/dL
Hgb urine dipstick: NEGATIVE
KETONES UR: NEGATIVE mg/dL
Nitrite: NEGATIVE
PROTEIN: NEGATIVE mg/dL
Specific Gravity, Urine: 1.003 — ABNORMAL LOW (ref 1.005–1.030)
pH: 7 (ref 5.0–8.0)

## 2016-07-12 LAB — BASIC METABOLIC PANEL
ANION GAP: 8 (ref 5–15)
BUN: 14 mg/dL (ref 6–20)
CHLORIDE: 98 mmol/L — AB (ref 101–111)
CO2: 25 mmol/L (ref 22–32)
Calcium: 8.8 mg/dL — ABNORMAL LOW (ref 8.9–10.3)
Creatinine, Ser: 0.7 mg/dL (ref 0.44–1.00)
GFR calc Af Amer: >60 mL/min (ref >60)
GFR calc non Af Amer: >60 mL/min (ref >60)
GLUCOSE: 105 mg/dL — AB (ref 65–99)
POTASSIUM: 3.6 mmol/L (ref 3.5–5.1)
Sodium: 131 mmol/L — ABNORMAL LOW (ref 135–145)

## 2016-07-12 MED ORDER — SODIUM CHLORIDE 0.9 % IV BOLUS (SEPSIS)
500.0000 mL | Freq: Once | INTRAVENOUS | Status: AC
  Administered 2016-07-12: 500 mL via INTRAVENOUS

## 2016-07-12 NOTE — ED Notes (Signed)
Bed: [id number] Expected date:  Expected time:  Means of arrival:  Comments: 71 yo heat exhaustion

## 2016-07-12 NOTE — ED Triage Notes (Addendum)
Per EMS, patient from home, reports she was outside today with her grandchildren. Had near syncopal episode with positive orthostatic changes. Denies pain, headache, blurred vision, and dizziness. 18g L FA  BP 150/70 HR 89 O2 93 CBG 117

## 2016-07-12 NOTE — Discharge Instructions (Signed)
Stay out of the heat.  Your sodium was 131 today. Please have this rechecked in the next week.  Get rechecked immediately if you have any new or worrisome symptoms.

## 2016-07-12 NOTE — ED Provider Notes (Signed)
[facility]-EMERGENCY DEPT Provider Note   CSN: [id number] Arrival date & time: 07/12/16  1832     History   Chief Complaint Chief Complaint  Patient presents with  . Near Syncope    HPI April Chung is a 71 y.o. female.  The history is provided by the patient. No language interpreter was used.  Near Syncope    April Chung is a 71 y.o. female who presents to the Emergency Department complaining of near syncope.  History is provided by the patient and her daughter-in-law. She was sitting outside at a baseball game from about 2 until 4. When she went to get up she had a near syncopal episode and she became pale and weak. She was orthostatic for EMS with hypertension but increase in her heart rate when she went from sitting to standing. She had not eaten since breakfast and had a sandwich. No fevers, chest pain, shortness of breath, bowel pain, nausea, vomiting, dysuria. She does have a history of memory loss and has some mild confusion that is at her baseline. Family states that her gait is slightly more shuffled routine usual. Past Medical History:  Diagnosis Date  . Abnormal EEG 01/14/12   started on Keppra  . Dermatomyositis (HCC)    remission on pred until 2008[facility]  . GERD (gastroesophageal reflux disease)    agravated with Fosamax  . Glaucoma   . Hypertension 2013  . Memory loss   . PMB (postmenopausal bleeding) 08/2003  . Seizures (HCC)   . Status post dilation of esophageal narrowing   . Urinary incontinence, mixed 07/01/11   Fitted for 2.75 " Ring Pessary with support  . Vitamin D deficiency disease     Patient Active Problem List   Diagnosis Date Noted  . Urinary incontinence in female 11/09/2012  . Osteopenia 11/09/2012  . Vitamin D deficiency 11/09/2012  . ADD (attention deficit disorder) 02/24/2012  . Seizure disorder (HCC)   . Hypertension   . Memory disturbance 12/08/2011  . Change in bowel habits   . GERD (gastroesophageal reflux disease)  05/28/2011  . Glaucoma 05/28/2011  . Dermatomyositis (HCC) 05/28/2011    Past Surgical History:  Procedure Laterality Date  . COLONOSCOPY  09/25/04   Dr. Loreta AveMann  . COLONOSCOPY  02/2009  . ENDOMETRIAL BIOPSY  08/24/03   weak proliferative endo, SHGM neg.  Marland Kitchen. ESOPHAGOGASTRODUODENOSCOPY ENDOSCOPY  04/29/04   anemia without cause  . NO PAST SURGERIES      OB History    Gravida Para Term Preterm AB Living   2 1 1  0 1 1   SAB TAB Ectopic Multiple Live Births   1 0 0 0 1       Home Medications    Prior to Admission medications   Medication Sig Start Date End Date Taking? Authorizing Provider  amLODipine (NORVASC) 2.5 MG tablet TAKE 1 TABLET BY MOUTH EVERY DAY 03/31/16   Nche, Bonna Gainsharlotte Lum, NP  aspirin 81 MG tablet Take 81 mg by mouth daily.    [provider]  Cholecalciferol (VITAMIN D3) 400 units CAPS Take 1 tablet by mouth 2 (two) times daily. 03/07/16   Nche, Bonna Gainsharlotte Lum, NP  citalopram (CELEXA) 20 MG tablet TAKE 1 TABLET BY MOUTH EVERY DAY 03/31/16   Nche, Bonna Gainsharlotte Lum, NP  donepezil (ARICEPT) 10 MG tablet Take 10 mg by mouth at bedtime. 04/28/16   [provider]  fish oil-omega-3 fatty acids 1000 MG capsule Take 1 g by  mouth daily.    [provider]  [facility] CALCIUM 600 MG TABS tablet TAKE 1 TABLET BY MOUTH 2 TIMES DAILY 03/07/16   Nche, Bonna Gains, NP  latanoprost (XALATAN) 0.005 % ophthalmic solution  11/12/15   [provider]  levETIRAcetam (KEPPRA) 250 MG tablet Take 1 tablet (250 mg total) by mouth 2 (two) times daily. 02/07/16   Butch Penny, NP  losartan (COZAAR) 100 MG tablet TAKE 1/2 TABLET BY MOUTH EVERY DAY 03/25/16   Nche, Bonna Gains, NP  memantine [facility] TITRATION PAK) tablet pack 5 mg/day for =1 week; 5 mg twice daily for =1 week; 15 mg/day given in 5 mg and 10 mg separated doses for =1 week; then 10 mg twice daily 02/07/16   Butch Penny, NP  memantine (NAMENDA) 10 MG tablet Take 1 tablet (10 mg total) by mouth 2 (two) times  daily. 03/10/16   Butch Penny, NP  Multiple Vitamins-Calcium (ONE-A-DAY WOMENS PO) Take by mouth daily.    [provider]  Probiotic Product (ALIGN) 4 MG CAPS Take by mouth every morning. Reported on 08/02/2015    [provider]    Family History Family History  Problem Relation Age of Onset  . Dementia Father        died age 75, otherwise healthy  . Hypertension Mother   . Heart disease Maternal Grandfather   . Heart disease Maternal Grandmother   . Seizures Neg Hx     Social History Social History  Substance Use Topics  . Smoking status: Former Smoker    Packs/day: 1.00    Years: 15.00    Types: Cigarettes    Quit date: 02/04/1976  . Smokeless tobacco: Never Used  . Alcohol use 4.2 oz/week    7 Glasses of wine per week     Comment: 1 glass of wine per day     Allergies   Plaquenil [hydroxychloroquine sulfate] and Hydroxychloroquine   Review of Systems Review of Systems  Cardiovascular: Positive for near-syncope.  All other systems reviewed and are negative.    Physical Exam Updated Vital Signs BP (!) 143/70   Pulse 69   Temp 98.8 F (37.1 C) (Oral)   Resp 19   LMP 08/03/1993 (Approximate)   SpO2 97%   Physical Exam  Constitutional: She is oriented to person, place, and time. She appears well-developed and well-nourished.  HENT:  Head: Normocephalic and atraumatic.  Eyes: EOM are normal. Pupils are equal, round, and reactive to light.  Neck: Neck supple.  Cardiovascular: Normal rate.   No murmur heard. Irregular rhythm  Pulmonary/Chest: Effort normal and breath sounds normal. No respiratory distress.  Abdominal: Soft. There is no tenderness. There is no rebound and no guarding.  Musculoskeletal: She exhibits no edema or tenderness.  Neurological: She is alert and oriented to person, place, and time.  5/5 strength in all 4 symptoms. Sensation to light touch intact in all 4 extremities.   Skin: Skin is warm and dry.  Psychiatric:  She has a normal mood and affect. Her behavior is normal.  Nursing note and vitals reviewed.    ED Treatments / Results  Labs (all labs ordered are listed, but only abnormal results are displayed) Labs Reviewed  BASIC METABOLIC PANEL - Abnormal; Notable for the following:       Result Value   Sodium 131 (*)    Chloride 98 (*)    Glucose, Bld 105 (*)    Calcium 8.8 (*)    All other components  within normal limits  CBC WITH DIFFERENTIAL/PLATELET - Abnormal; Notable for the following:    Lymphs Abs 0.3 (*)    All other components within normal limits  URINALYSIS, ROUTINE W REFLEX MICROSCOPIC - Abnormal; Notable for the following:    Color, Urine STRAW (*)    Specific Gravity, Urine 1.003 (*)    Leukocytes, UA TRACE (*)    Bacteria, UA FEW (*)    Squamous Epithelial / LPF 0-5 (*)    All other components within normal limits    EKG  EKG Interpretation  Date/Time:  Saturday July 12 2016 18:52:28 EDT Ventricular Rate:  72 PR Interval:    QRS Duration: 81 QT Interval:  400 QTC Calculation: 438 R Axis:   58 Text Interpretation:  Sinus rhythm Probable left atrial enlargement Anteroseptal infarct, old Confirmed by Lincoln Brigham [phone]) on 07/12/2016 8:16:40 PM       Radiology Dg Chest 2 View  Result Date: 07/12/2016 CLINICAL DATA:  71 year old female with near syncopal episode today. EXAM: CHEST  2 VIEW COMPARISON:  Chest x-ray 09/30/2006. FINDINGS: Thickened bronchi with a tram-track appearance in the region of the right lower lobe, concerning for bronchiectasis. No acute consolidative airspace disease. No pleural effusions. No evidence of pulmonary edema. Heart size is normal. Upper mediastinal contours are within normal limits. Aortic atherosclerosis. IMPRESSION: 1. No radiographic evidence of acute cardiopulmonary disease. 2. Probable bronchiectasis in the medial aspect of the right lower lobe. 3. Aortic atherosclerosis. Electronically Signed   By: Trudie Reed M.D.   On:  07/12/2016 20:24    Procedures Procedures (including critical care time)  Medications Ordered in ED Medications  sodium chloride 0.9 % bolus 500 mL (0 mLs Intravenous Stopped 07/12/16 2107)     Initial Impression / Assessment and Plan / ED Course  I have reviewed the triage vital signs and the nursing notes.  Pertinent labs & imaging results that were available during my care of the patient were reviewed by me and considered in my medical decision making (see chart for details).     Patient here for evaluation following a near syncopal event after being out of the heat for several hours. She is asymptomatic in the emergency department. Presentation is not consistent with ACS, PE, dissection. Labs demonstrate hyponatremia with normal renal function. UA is consistent with UTI. On repeat assessment she is feeling improved and able to ambulate at her baseline gait, which is shuffling in nature. Plan to DC home with outpatient follow-up. Recommend repeat BMP in the next week to make sure her hyponatremia does not worsen. Return precautions discussed.  Final Clinical Impressions(s) / ED Diagnoses   Final diagnoses:  Near syncope  Hyponatremia    New Prescriptions Discharge Medication List as of 07/12/2016  8:52 PM       Tilden Fossa, MD 07/12/16 2229

## 2016-07-12 NOTE — ED Notes (Signed)
Family at bedside. 

## 2016-07-21 ENCOUNTER — Other Ambulatory Visit: Payer: Self-pay | Admitting: Nurse Practitioner

## 2016-07-21 ENCOUNTER — Encounter: Payer: Self-pay | Admitting: Nurse Practitioner

## 2016-07-21 ENCOUNTER — Ambulatory Visit (INDEPENDENT_AMBULATORY_CARE_PROVIDER_SITE_OTHER): Payer: Medicare Other | Admitting: Nurse Practitioner

## 2016-07-21 ENCOUNTER — Other Ambulatory Visit (INDEPENDENT_AMBULATORY_CARE_PROVIDER_SITE_OTHER): Payer: Medicare Other

## 2016-07-21 ENCOUNTER — Other Ambulatory Visit: Payer: Self-pay | Admitting: Adult Health

## 2016-07-21 VITALS — BP 148/80 | HR 47 | Temp 97.7°F | Ht 60.0 in | Wt 106.0 lb

## 2016-07-21 DIAGNOSIS — E559 Vitamin D deficiency, unspecified: Secondary | ICD-10-CM

## 2016-07-21 DIAGNOSIS — D72829 Elevated white blood cell count, unspecified: Secondary | ICD-10-CM | POA: Diagnosis not present

## 2016-07-21 DIAGNOSIS — R32 Unspecified urinary incontinence: Secondary | ICD-10-CM | POA: Diagnosis not present

## 2016-07-21 DIAGNOSIS — R8290 Unspecified abnormal findings in urine: Secondary | ICD-10-CM | POA: Diagnosis not present

## 2016-07-21 DIAGNOSIS — R42 Dizziness and giddiness: Secondary | ICD-10-CM

## 2016-07-21 DIAGNOSIS — I10 Essential (primary) hypertension: Secondary | ICD-10-CM | POA: Diagnosis not present

## 2016-07-21 DIAGNOSIS — R55 Syncope and collapse: Secondary | ICD-10-CM

## 2016-07-21 DIAGNOSIS — F339 Major depressive disorder, recurrent, unspecified: Secondary | ICD-10-CM

## 2016-07-21 LAB — URINALYSIS, ROUTINE W REFLEX MICROSCOPIC
BILIRUBIN URINE: NEGATIVE
Hgb urine dipstick: NEGATIVE
KETONES UR: NEGATIVE
LEUKOCYTES UA: NEGATIVE
NITRITE: NEGATIVE
PH: 7.5 (ref 5.0–8.0)
RBC / HPF: NONE SEEN (ref 0–?)
Specific Gravity, Urine: 1.015 (ref 1.000–1.030)
TOTAL PROTEIN, URINE-UPE24: NEGATIVE
UROBILINOGEN UA: 0.2 (ref 0.0–1.0)
Urine Glucose: NEGATIVE

## 2016-07-21 LAB — CBC WITH DIFFERENTIAL/PLATELET
BASOS PCT: 0.4 % (ref 0.0–3.0)
Basophils Absolute: 0.1 10*3/uL (ref 0.0–0.1)
EOS ABS: 0.1 10*3/uL (ref 0.0–0.7)
Eosinophils Relative: 0.5 % (ref 0.0–5.0)
HCT: 42 % (ref 36.0–46.0)
HEMOGLOBIN: 13.9 g/dL (ref 12.0–15.0)
Lymphocytes Relative: 47.1 % — ABNORMAL HIGH (ref 12.0–46.0)
Lymphs Abs: 6.9 10*3/uL — ABNORMAL HIGH (ref 0.7–4.0)
MCHC: 33 g/dL (ref 30.0–36.0)
MCV: 95.2 fl (ref 78.0–100.0)
MONO ABS: 1.1 10*3/uL — AB (ref 0.1–1.0)
Monocytes Relative: 7.4 % (ref 3.0–12.0)
NEUTROS ABS: 6.6 10*3/uL (ref 1.4–7.7)
NEUTROS PCT: 44.6 % (ref 43.0–77.0)
PLATELETS: 427 10*3/uL — AB (ref 150.0–400.0)
RBC: 4.41 Mil/uL (ref 3.87–5.11)
RDW: 12.6 % (ref 11.5–15.5)
WBC: 14.7 10*3/uL — AB (ref 4.0–10.5)

## 2016-07-21 LAB — BASIC METABOLIC PANEL
BUN: 15 mg/dL (ref 6–23)
CO2: 30 mEq/L (ref 19–32)
Calcium: 9.8 mg/dL (ref 8.4–10.5)
Chloride: 101 mEq/L (ref 96–112)
Creatinine, Ser: 0.63 mg/dL (ref 0.40–1.20)
GFR: 98.35 mL/min (ref >60.00)
Glucose, Bld: 89 mg/dL (ref 70–99)
POTASSIUM: 4 meq/L (ref 3.5–5.1)
SODIUM: 137 meq/L (ref 135–145)

## 2016-07-21 MED ORDER — CITALOPRAM HYDROBROMIDE 20 MG PO TABS: 20.0000 mg | ORAL_TABLET | Freq: Every day | ORAL | 1 refills | Status: DC

## 2016-07-21 MED ORDER — VITAMIN D3 10 MCG (400 UNIT) PO CAPS: 1.0000 | ORAL_CAPSULE | Freq: Two times a day (BID) | ORAL | 1 refills | Status: DC

## 2016-07-21 MED ORDER — AMLODIPINE BESYLATE 2.5 MG PO TABS: 2.5000 mg | ORAL_TABLET | Freq: Every day | ORAL | 1 refills | Status: DC

## 2016-07-21 MED ORDER — LOSARTAN POTASSIUM 100 MG PO TABS
50.0000 mg | ORAL_TABLET | Freq: Every day | ORAL | 1 refills | Status: DC
Start: 2016-07-21 — End: 2017-01-08

## 2016-07-21 MED ORDER — CIPROFLOXACIN HCL 250 MG PO TABS: 250.0000 mg | ORAL_TABLET | Freq: Two times a day (BID) | ORAL | 0 refills | Status: DC

## 2016-07-21 NOTE — Patient Instructions (Addendum)
Go to basement for blood draw and urine collection.  You will be called to schedule head CT.  Call neurology for follow up appt. Will consider brain MRI if normal head CT.

## 2016-07-21 NOTE — Progress Notes (Signed)
Subjective:  Patient ID: Gabriel RainwaterMyra M Barletta, female    DOB: 05/14/1945  Age: 71 y.o. MRN: [id number]  CC: Hospitalization Follow-up (ER follow--pass out on Sat--couldnt walk for 3 days and cant control bladder. CAN YOU SEND IN NASAL SPRAY TO [facility]??)  Loss of Consciousness  This is a new problem. The current episode started in the past 7 days. The problem has been resolved. There was no loss of consciousness. Exacerbated by: sitting at baseball game. Associated symptoms include bladder incontinence, clumsiness and confusion. Pertinent negatives include no abdominal pain, auditory change, aura, back pain, bowel incontinence, chest pain, diaphoresis, dizziness, fever, focal sensory loss, focal weakness, headaches, light-headedness, malaise/fatigue, nausea, palpitations, slurred speech, vertigo, visual change, vomiting or weakness. She has tried bed rest and drinking for the symptoms.  Mrs. Katrinka BlazingSmith is in office with her husband and daughter in law. She does not remember going to hospital after near syncopal episode. She does remember near syncopal episode.  Daughter in law is also concerned about shuffling gait after incident. Gait has not corrected itself.  Husband reports mrs.Katrinka BlazingSmith has no controlled over bladder function from 06/09 to 06/13.  Outpatient Medications Prior to Visit  Medication Sig Dispense Refill  . aspirin 81 MG tablet Take 81 mg by mouth daily.    Marland Kitchen. donepezil (ARICEPT) 10 MG tablet Take 10 mg by mouth at bedtime.  11  . fish oil-omega-3 fatty acids 1000 MG capsule Take 1 g by mouth daily.    Marland Kitchen. GNP CALCIUM 600 MG TABS tablet TAKE 1 TABLET BY MOUTH 2 TIMES DAILY 60 tablet 5  . latanoprost (XALATAN) 0.005 % ophthalmic solution   1  . levETIRAcetam (KEPPRA) 250 MG tablet Take 1 tablet (250 mg total) by mouth 2 (two) times daily. 60 tablet 11  . memantine (NAMENDA) 10 MG tablet TAKE 1 TABLET BY MOUTH 2 TIMES DAILY 60 tablet 5  . Multiple Vitamins-Calcium (ONE-A-DAY WOMENS PO)  Take by mouth daily.    . Probiotic Product (ALIGN) 4 MG CAPS Take by mouth every morning. Reported on 08/02/2015    . amLODipine (NORVASC) 2.5 MG tablet TAKE 1 TABLET BY MOUTH EVERY DAY 30 tablet 5  . Cholecalciferol (VITAMIN D3) 400 units CAPS Take 1 tablet by mouth 2 (two) times daily. 150 capsule 2  . citalopram (CELEXA) 20 MG tablet TAKE 1 TABLET BY MOUTH EVERY DAY 30 tablet 5  . losartan (COZAAR) 100 MG tablet TAKE 1/2 TABLET BY MOUTH EVERY DAY 30 tablet 1   No facility-administered medications prior to visit.     ROS See HPI  Objective:  BP (!) 148/80   Pulse (!) 47   Temp 97.7 F (36.5 C)   Ht 5' (1.524 m)   Wt 106 lb (48.1 kg)   LMP 08/03/1993 (Approximate)   SpO2 98%   BMI 20.70 kg/m   BP Readings from Last 3 Encounters:  07/21/16 (!) 148/80  07/12/16 (!) 143/70  05/07/16 (!) 160/72    Wt Readings from Last 3 Encounters:  07/21/16 106 lb (48.1 kg)  05/07/16 109 lb (49.4 kg)  02/07/16 106 lb (48.1 kg)    Physical Exam  Constitutional: She is oriented to person, place, and time. No distress.  HENT:  Right Ear: External ear normal.  Left Ear: External ear normal.  Eyes: Conjunctivae and EOM are normal. No scleral icterus.  Cardiovascular: Normal rate, regular rhythm and normal heart sounds.   No murmur heard. Pulmonary/Chest: Effort normal and breath sounds normal.  Abdominal:  Soft. Bowel sounds are normal.  Musculoskeletal: Normal range of motion. She exhibits no edema, tenderness or deformity.  Neurological: She is alert and oriented to person, place, and time. She has normal reflexes. No cranial nerve deficit. Coordination normal.  Normal gait  Skin: Skin is warm and dry.  Vitals reviewed.   Lab Results  Component Value Date   WBC 14.7 (H) 07/21/2016   HGB 13.9 07/21/2016   HCT 42.0 07/21/2016   PLT 427.0 (H) 07/21/2016   GLUCOSE 89 07/21/2016   CHOL 213 (H) 01/02/2016   TRIG 119.0 01/02/2016   HDL 60.00 01/02/2016   LDLDIRECT 129.4 08/24/2012     LDLCALC 129 (H) 01/02/2016   ALT 18 02/07/2016   AST 26 02/07/2016   NA 137 07/21/2016   K 4.0 07/21/2016   CL 101 07/21/2016   CREATININE 0.63 07/21/2016   BUN 15 07/21/2016   CO2 30 07/21/2016   TSH 1.850 02/07/2016    Dg Chest 2 View  Result Date: 07/12/2016 CLINICAL DATA:  71 year old female with near syncopal episode today. EXAM: CHEST  2 VIEW COMPARISON:  Chest x-ray 09/30/2006. FINDINGS: Thickened bronchi with a tram-track appearance in the region of the right lower lobe, concerning for bronchiectasis. No acute consolidative airspace disease. No pleural effusions. No evidence of pulmonary edema. Heart size is normal. Upper mediastinal contours are within normal limits. Aortic atherosclerosis. IMPRESSION: 1. No radiographic evidence of acute cardiopulmonary disease. 2. Probable bronchiectasis in the medial aspect of the right lower lobe. 3. Aortic atherosclerosis. Electronically Signed   By: Trudie Reed M.D.   On: 07/12/2016 20:24    Assessment & Plan:   Tynasia was seen today for hospitalization follow-up.  Diagnoses and all orders for this visit:  Urinary incontinence, unspecified type -     Urinalysis, Routine w reflex microscopic; Future -     CT Head Wo Contrast; Future -     ciprofloxacin (CIPRO) 250 MG tablet; Take 1 tablet (250 mg total) by mouth 2 (two) times daily.  Postural dizziness with presyncope -     Basic metabolic panel; Future -     CT Head Wo Contrast; Future  Essential hypertension -     amLODipine (NORVASC) 2.5 MG tablet; Take 1 tablet (2.5 mg total) by mouth daily. -     losartan (COZAAR) 100 MG tablet; Take 0.5 tablets (50 mg total) by mouth daily.  Vitamin D deficiency -     Cholecalciferol (VITAMIN D3) 400 units CAPS; Take 1 tablet by mouth 2 (two) times daily.  Recurrent major depressive disorder, remission status unspecified (HCC) -     citalopram (CELEXA) 20 MG tablet; Take 1 tablet (20 mg total) by mouth daily.  Abnormal finding on  urinalysis -     ciprofloxacin (CIPRO) 250 MG tablet; Take 1 tablet (250 mg total) by mouth 2 (two) times daily.   I have changed Ms. Hyden's amLODipine, citalopram, and losartan. I am also having her start on ciprofloxacin. Additionally, I am having her maintain her aspirin, fish oil-omega-3 fatty acids, Multiple Vitamins-Calcium (ONE-A-DAY WOMENS PO), ALIGN, latanoprost, levETIRAcetam, GNP CALCIUM, donepezil, memantine, and Vitamin D3.  Meds ordered this encounter  Medications  . amLODipine (NORVASC) 2.5 MG tablet    Sig: Take 1 tablet (2.5 mg total) by mouth daily.    Dispense:  90 tablet    Refill:  1    Order Specific Question:   Supervising Provider    Answer:   Tresa Garter [[id number]]  . Cholecalciferol (  VITAMIN D3) 400 units CAPS    Sig: Take 1 tablet by mouth 2 (two) times daily.    Dispense:  180 capsule    Refill:  1    Order Specific Question:   Supervising Provider    Answer:   Tresa Garter [[id number]]  . citalopram (CELEXA) 20 MG tablet    Sig: Take 1 tablet (20 mg total) by mouth daily.    Dispense:  90 tablet    Refill:  1    Order Specific Question:   Supervising Provider    Answer:   Tresa Garter [[id number]]  . losartan (COZAAR) 100 MG tablet    Sig: Take 0.5 tablets (50 mg total) by mouth daily.    Dispense:  45 tablet    Refill:  1    Order Specific Question:   Supervising Provider    Answer:   Tresa Garter [[id number]]  . ciprofloxacin (CIPRO) 250 MG tablet    Sig: Take 1 tablet (250 mg total) by mouth 2 (two) times daily.    Dispense:  6 tablet    Refill:  0    Order Specific Question:   Supervising Provider    Answer:   Tresa Garter [[id number]]    Follow-up: Return in about 3 months (around 10/21/2016) for HTN.  Alysia Penna, NP

## 2016-07-22 ENCOUNTER — Telehealth: Payer: Self-pay | Admitting: Adult Health

## 2016-07-22 ENCOUNTER — Ambulatory Visit (INDEPENDENT_AMBULATORY_CARE_PROVIDER_SITE_OTHER)
Admission: RE | Admit: 2016-07-22 | Discharge: 2016-07-22 | Disposition: A | Discharge status: Home / Self Care | Payer: Medicare Other | Source: Ambulatory Visit | Attending: Nurse Practitioner | Admitting: Nurse Practitioner

## 2016-07-22 ENCOUNTER — Encounter: Payer: Self-pay | Admitting: Nurse Practitioner

## 2016-07-22 ENCOUNTER — Other Ambulatory Visit: Payer: Medicare Other

## 2016-07-22 ENCOUNTER — Telehealth: Payer: Self-pay | Admitting: Nurse Practitioner

## 2016-07-22 DIAGNOSIS — R0982 Postnasal drip: Secondary | ICD-10-CM

## 2016-07-22 DIAGNOSIS — R42 Dizziness and giddiness: Secondary | ICD-10-CM | POA: Diagnosis not present

## 2016-07-22 DIAGNOSIS — R8290 Unspecified abnormal findings in urine: Secondary | ICD-10-CM | POA: Diagnosis not present

## 2016-07-22 DIAGNOSIS — R32 Unspecified urinary incontinence: Secondary | ICD-10-CM

## 2016-07-22 DIAGNOSIS — R55 Syncope and collapse: Secondary | ICD-10-CM | POA: Diagnosis not present

## 2016-07-22 DIAGNOSIS — N3000 Acute cystitis without hematuria: Secondary | ICD-10-CM

## 2016-07-22 MED ORDER — IPRATROPIUM BROMIDE 0.03 % NA SOLN: 2.0000 | Freq: Two times a day (BID) | NASAL | 0 refills | Status: DC

## 2016-07-22 NOTE — Telephone Encounter (Signed)
This is a new issue need notes from PCP pt needs to be schedule with Dr.Wilis.  Note states pt has extreme weakness.PT is being see for memory and seizures.

## 2016-07-22 NOTE — Telephone Encounter (Signed)
Atrovent sent into [facility] and advise pt;s [facility] Anne HahnWillis to office visit note twice in Epic already.

## 2016-07-22 NOTE — Telephone Encounter (Signed)
Patients husband called office in reference to [facility] over the weekend due to extreme weakness at game on 07/13/16.  Patient went to see PCP and was advised to follow up with her neurologist.  I gave Mr. April Chung the first available for April MilletMegan being July 23rd which he did not want to wait that long for patient to be seen.  Please call

## 2016-07-22 NOTE — Telephone Encounter (Signed)
LMOM for patient husband to return my call in reference to scheduling a follow up visit with Dr. Anne HahnWillis for new problem of weakness.

## 2016-07-22 NOTE — Telephone Encounter (Signed)
Pt husband called stating yesterday at her visit [facility] was supposed to call in a medication for her sinuses, please call in to [facility] in [place] off of [address].  Also they were told to make an appt with her neurologists, Dr Anne HahnWillis, Dr. Anne HahnWillis needs office notes from [facility]terdays visit [facility] Fax: [phone]

## 2016-07-24 LAB — URINE CULTURE

## 2016-07-25 ENCOUNTER — Telehealth: Payer: Self-pay | Admitting: Nurse Practitioner

## 2016-07-25 DIAGNOSIS — N3000 Acute cystitis without hematuria: Secondary | ICD-10-CM

## 2016-07-25 MED ORDER — CIPROFLOXACIN HCL 500 MG PO TABS: 250.0000 mg | ORAL_TABLET | Freq: Two times a day (BID) | ORAL | 0 refills | Status: DC

## 2016-07-25 MED ORDER — NITROFURANTOIN MONOHYD MACRO 100 MG PO CAPS: 100.0000 mg | ORAL_CAPSULE | Freq: Two times a day (BID) | ORAL | 0 refills | Status: AC

## 2016-07-25 NOTE — Telephone Encounter (Signed)
April [facility]Kathy pharmacists [facility] is reporting a drug interaction between ciprofloxacin (CIPRO) 500 MG tablet and citalopram (CELEXA) 20 MG tablet - it causes QT prolongation-heart arrythmia Please advise do you still want them to dispense or send something else in.  can call back at [phone]

## 2016-07-28 ENCOUNTER — Encounter: Payer: Self-pay | Admitting: Neurology

## 2016-07-28 ENCOUNTER — Ambulatory Visit (INDEPENDENT_AMBULATORY_CARE_PROVIDER_SITE_OTHER): Payer: Medicare Other | Admitting: Neurology

## 2016-07-28 VITALS — BP 125/66 | HR 61 | Ht 60.0 in | Wt 104.5 lb

## 2016-07-28 DIAGNOSIS — R413 Other amnesia: Secondary | ICD-10-CM | POA: Diagnosis not present

## 2016-07-28 DIAGNOSIS — R55 Syncope and collapse: Secondary | ICD-10-CM | POA: Insufficient documentation

## 2016-07-28 DIAGNOSIS — G40909 Epilepsy, unspecified, not intractable, without status epilepticus: Secondary | ICD-10-CM | POA: Diagnosis not present

## 2016-07-28 NOTE — Progress Notes (Signed)
Reason for visit: Near syncope  April Chung is an 71 y.o. female  History of present illness:  April Chung is a 71 year old right-handed white female with a history of a seizure disorder and a mild memory disorder. The patient has been ventriculomegaly on prior studies. The patient went to the emergency room on 07/12/2016 with onset of near-syncope. The patient was in a 90 day, she was in shade and was drinking water, but she began feeling lightheaded, near syncopal, and she was pale and clammy. The patient was noted to have urinary incontinence and gait instability. She went to the emergency room was found to have hyponatremia with a sodium level of 131, she was found to have a mild urinary tract infection. The patient was given fluids, she was treated for a bladder infection which improved. She was staggery for several days afterwards, but she has returned to her usual baseline. The patient underwent a CT scan of the brain that was unremarkable, the ventriculomegaly was stable. The patient has not had any recent seizures. She remains on Aricept and Namenda for the memory, the memory has remained stable. She returns for an evaluation.  Past Medical History:  Diagnosis Date  . Abnormal EEG 01/14/12   started on Keppra  . Dermatomyositis (HCC)    remission on pred until 2008, [facility]  . GERD (gastroesophageal reflux disease)    agravated with Fosamax  . Glaucoma   . Hypertension 2013  . Memory loss   . PMB (postmenopausal bleeding) 08/2003  . Seizures (HCC)   . Status post dilation of esophageal narrowing   . Urinary incontinence, mixed 07/01/11   Fitted for 2.75 " Ring Pessary with support  . Vitamin D deficiency disease     Past Surgical History:  Procedure Laterality Date  . COLONOSCOPY  09/25/04   Dr. Loreta AveMann  . COLONOSCOPY  02/2009  . ENDOMETRIAL BIOPSY  08/24/03   weak proliferative endo, [facility] neg.  Marland Kitchen. ESOPHAGOGASTRODUODENOSCOPY ENDOSCOPY  04/29/04   anemia without  cause  . NO PAST SURGERIES      Family History  Problem Relation Age of Onset  . Dementia Father        died age 974, otherwise healthy  . Hypertension Mother   . Heart disease Maternal Grandfather   . Heart disease Maternal Grandmother   . Seizures Neg Hx     Social history:  reports that she quit smoking about 40 years ago. Her smoking use included Cigarettes. She has a 15.00 pack-year smoking history. She has never used smokeless tobacco. She reports that she drinks about 4.2 oz of alcohol per week . She reports that she does not use drugs.    Allergies  Allergen Reactions  . Plaquenil [Hydroxychloroquine Sulfate]   . Hydroxychloroquine Rash    Medications:  Prior to Admission medications   Medication Sig Start Date End Date Taking? Authorizing Provider  amLODipine (NORVASC) 2.5 MG tablet Take 1 tablet (2.5 mg total) by mouth daily. 07/21/16  Yes Nche, Bonna Gainsharlotte Lum, NP  aspirin 81 MG tablet Take 81 mg by mouth daily.   Yes [provider]  Cholecalciferol (VITAMIN D3) 400 units CAPS Take 1 tablet by mouth 2 (two) times daily. 07/21/16  Yes Nche, Bonna Gainsharlotte Lum, NP  ciprofloxacin (CIPRO) 250 MG tablet Take 250 mg by mouth 2 (two) times daily. 07/21/16  Yes [provider]  citalopram (CELEXA) 20 MG tablet Take 1 tablet (20 mg total) by mouth daily.  07/21/16  Yes Nche, Bonna Gains, NP  donepezil (ARICEPT) 10 MG tablet Take 10 mg by mouth at bedtime. 04/28/16  Yes [provider]  fish oil-omega-3 fatty acids 1000 MG capsule Take 1 g by mouth daily.   Yes [provider]  GNP CALCIUM 600 MG TABS tablet TAKE 1 TABLET BY MOUTH 2 TIMES DAILY 03/07/16  Yes Nche, Bonna Gains, NP  ipratropium (ATROVENT) 0.03 % nasal spray Place 2 sprays into both nostrils 2 (two) times daily. Do not use for more than 5days. 07/22/16  Yes Nche, Bonna Gains, NP  latanoprost (XALATAN) 0.005 % ophthalmic solution  11/12/15  Yes [provider]  levETIRAcetam (KEPPRA)  250 MG tablet Take 1 tablet (250 mg total) by mouth 2 (two) times daily. 02/07/16  Yes Butch Penny, NP  losartan (COZAAR) 100 MG tablet Take 0.5 tablets (50 mg total) by mouth daily. 07/21/16  Yes Nche, Bonna Gains, NP  memantine (NAMENDA) 10 MG tablet TAKE 1 TABLET BY MOUTH 2 TIMES DAILY 07/21/16  Yes York Spaniel, MD  Multiple Vitamins-Calcium (ONE-A-DAY WOMENS PO) Take by mouth daily.   Yes [provider]  nitrofurantoin, macrocrystal-monohydrate, (MACROBID) 100 MG capsule Take 1 capsule (100 mg total) by mouth 2 (two) times daily. 07/25/16 08/01/16 Yes Nche, Bonna Gains, NP  Probiotic Product (ALIGN) 4 MG CAPS Take by mouth every morning. Reported on 08/02/2015   Yes [provider]    ROS:  Out of a complete 14 system review of symptoms, the patient complains only of the following symptoms, and all other reviewed systems are negative.  Memory problems Dizziness  Blood pressure 125/66, pulse 61, height 5' (1.524 m), weight 104 lb 8 oz (47.4 kg), last menstrual period 08/03/1993.  Physical Exam  General: The patient is alert and cooperative at the time of the examination.  Skin: No significant peripheral edema is noted.   Neurologic Exam  Mental status: The patient is alert and oriented x 3 at the time of the examination. The patient has apparent normal recent and remote memory, with an apparently normal attention span and concentration ability. Mini-Mental Status Examination done today shows a total score 28/30.   Cranial nerves: Facial symmetry is present. Speech is normal, no aphasia or dysarthria is noted. Extraocular movements are full. Visual fields are full.  Motor: The patient has good strength in all 4 extremities.  Sensory examination: Soft touch sensation is symmetric on the face, arms, and legs.  Coordination: The patient has good finger-nose-finger and heel-to-shin bilaterally.  Gait and station: The patient has a minimally wide based  gait. Tandem gait is normal. Romberg is negative. No drift is seen.  Reflexes: Deep tendon reflexes are symmetric.   CT head 07/12/16:  IMPRESSION: 1. No acute intracranial abnormality is identified. 2. Stable severe lateral and third ventriculomegaly.  * CT scan images were reviewed online. I agree with the written report.   Assessment/Plan:  1. Mild memory disturbance  2. Near syncopal event  3. History of seizure, well controlled  The patient will remain on Keppra, Namenda, and Aricept. The patient did have a brief episode of near syncope associated with some urinary incontinence and gait instability associated with a urinary tract infection. The patient had hyponatremia. She has recovered from this, she is doing well. His sodium level has been rechecked and was normal. I would not add to the evaluation. If the patient has persistence of gait instability and urinary incontinence in the future, normal pressure hydrocephalus should be  a diagnostic consideration. She will follow-up in 6 months.    Marlan Palau MD 07/28/2016 10:50 AM  [facility] [address] [place], [place] [address]  Phone [phone] Fax [phone]

## 2016-08-18 ENCOUNTER — Other Ambulatory Visit: Payer: Self-pay | Admitting: Nurse Practitioner

## 2016-08-18 DIAGNOSIS — E559 Vitamin D deficiency, unspecified: Secondary | ICD-10-CM

## 2016-09-08 DIAGNOSIS — H401113 Primary open-angle glaucoma, right eye, severe stage: Secondary | ICD-10-CM | POA: Diagnosis not present

## 2016-09-15 ENCOUNTER — Other Ambulatory Visit: Payer: Self-pay | Admitting: Nurse Practitioner

## 2016-09-15 DIAGNOSIS — I10 Essential (primary) hypertension: Secondary | ICD-10-CM

## 2016-11-08 DIAGNOSIS — Z23 Encounter for immunization: Secondary | ICD-10-CM | POA: Diagnosis not present

## 2016-11-17 ENCOUNTER — Ambulatory Visit: Payer: Medicare Other | Admitting: Neurology

## 2016-12-23 DIAGNOSIS — F4323 Adjustment disorder with mixed anxiety and depressed mood: Secondary | ICD-10-CM | POA: Diagnosis not present

## 2016-12-23 DIAGNOSIS — Z79899 Other long term (current) drug therapy: Secondary | ICD-10-CM | POA: Diagnosis not present

## 2016-12-23 DIAGNOSIS — G3184 Mild cognitive impairment, so stated: Secondary | ICD-10-CM | POA: Diagnosis not present

## 2016-12-29 DIAGNOSIS — H401112 Primary open-angle glaucoma, right eye, moderate stage: Secondary | ICD-10-CM | POA: Diagnosis not present

## 2017-01-08 ENCOUNTER — Other Ambulatory Visit: Payer: Self-pay | Admitting: Nurse Practitioner

## 2017-01-08 ENCOUNTER — Other Ambulatory Visit: Payer: Self-pay | Admitting: Neurology

## 2017-01-08 ENCOUNTER — Other Ambulatory Visit: Payer: Self-pay | Admitting: Adult Health

## 2017-01-08 DIAGNOSIS — I10 Essential (primary) hypertension: Secondary | ICD-10-CM

## 2017-01-08 DIAGNOSIS — G40909 Epilepsy, unspecified, not intractable, without status epilepticus: Secondary | ICD-10-CM

## 2017-01-08 DIAGNOSIS — E559 Vitamin D deficiency, unspecified: Secondary | ICD-10-CM

## 2017-01-08 NOTE — Telephone Encounter (Signed)
30 days supply sent in.pt needs to see PCP for more refills or transfer to another PCP.

## 2017-02-04 ENCOUNTER — Other Ambulatory Visit: Payer: Self-pay | Admitting: Nurse Practitioner

## 2017-02-04 DIAGNOSIS — E559 Vitamin D deficiency, unspecified: Secondary | ICD-10-CM

## 2017-02-06 ENCOUNTER — Ambulatory Visit: Payer: Medicare Other | Admitting: Nurse Practitioner

## 2017-02-06 ENCOUNTER — Ambulatory Visit: Payer: Medicare Other | Admitting: Obstetrics and Gynecology

## 2017-02-09 ENCOUNTER — Encounter: Payer: Self-pay | Admitting: Nurse Practitioner

## 2017-02-09 ENCOUNTER — Ambulatory Visit: Payer: Medicare Other | Admitting: Nurse Practitioner

## 2017-02-09 VITALS — BP 122/70 | HR 46 | Temp 97.5°F | Ht 60.0 in | Wt 106.0 lb

## 2017-02-09 DIAGNOSIS — Z1231 Encounter for screening mammogram for malignant neoplasm of breast: Secondary | ICD-10-CM

## 2017-02-09 DIAGNOSIS — E559 Vitamin D deficiency, unspecified: Secondary | ICD-10-CM | POA: Diagnosis not present

## 2017-02-09 DIAGNOSIS — Z0000 Encounter for general adult medical examination without abnormal findings: Secondary | ICD-10-CM | POA: Diagnosis not present

## 2017-02-09 DIAGNOSIS — F339 Major depressive disorder, recurrent, unspecified: Secondary | ICD-10-CM | POA: Diagnosis not present

## 2017-02-09 DIAGNOSIS — M8589 Other specified disorders of bone density and structure, multiple sites: Secondary | ICD-10-CM | POA: Diagnosis not present

## 2017-02-09 DIAGNOSIS — E782 Mixed hyperlipidemia: Secondary | ICD-10-CM | POA: Diagnosis not present

## 2017-02-09 DIAGNOSIS — I10 Essential (primary) hypertension: Secondary | ICD-10-CM

## 2017-02-09 DIAGNOSIS — H04123 Dry eye syndrome of bilateral lacrimal glands: Secondary | ICD-10-CM | POA: Diagnosis not present

## 2017-02-09 LAB — COMPREHENSIVE METABOLIC PANEL
ALT: 23 U/L (ref 0–35)
AST: 28 U/L (ref 0–37)
Albumin: 4 g/dL (ref 3.5–5.2)
Alkaline Phosphatase: 64 U/L (ref 39–117)
BUN: 21 mg/dL (ref 6–23)
CALCIUM: 9.1 mg/dL (ref 8.4–10.5)
CHLORIDE: 103 meq/L (ref 96–112)
CO2: 29 mEq/L (ref 19–32)
Creatinine, Ser: 0.65 mg/dL (ref 0.40–1.20)
GFR: 94.72 mL/min (ref >60.00)
Glucose, Bld: 80 mg/dL (ref 70–99)
POTASSIUM: 4.5 meq/L (ref 3.5–5.1)
Sodium: 139 mEq/L (ref 135–145)
Total Bilirubin: 0.6 mg/dL (ref 0.2–1.2)
Total Protein: 7.2 g/dL (ref 6.0–8.3)

## 2017-02-09 LAB — CBC WITH DIFFERENTIAL/PLATELET
BASOS PCT: 0.7 % (ref 0.0–3.0)
Basophils Absolute: 0.1 10*3/uL (ref 0.0–0.1)
Eosinophils Absolute: 0.2 10*3/uL (ref 0.0–0.7)
Eosinophils Relative: 1.5 % (ref 0.0–5.0)
HEMATOCRIT: 41.1 % (ref 36.0–46.0)
HEMOGLOBIN: 13.6 g/dL (ref 12.0–15.0)
LYMPHS PCT: 21.3 % (ref 12.0–46.0)
Lymphs Abs: 2.2 10*3/uL (ref 0.7–4.0)
MCHC: 33.1 g/dL (ref 30.0–36.0)
MCV: 96.9 fl (ref 78.0–100.0)
Monocytes Absolute: 0.5 10*3/uL (ref 0.1–1.0)
Monocytes Relative: 4.8 % (ref 3.0–12.0)
NEUTROS ABS: 7.6 10*3/uL (ref 1.4–7.7)
Neutrophils Relative %: 71.7 % (ref 43.0–77.0)
PLATELETS: 297 10*3/uL (ref 150.0–400.0)
RBC: 4.24 Mil/uL (ref 3.87–5.11)
RDW: 13 % (ref 11.5–15.5)
WBC: 10.5 10*3/uL (ref 4.0–10.5)

## 2017-02-09 LAB — LIPID PANEL
CHOL/HDL RATIO: 5
Cholesterol: 211 mg/dL — ABNORMAL HIGH (ref 0–200)
HDL: 43.1 mg/dL (ref >39.00)
NonHDL: 168.23
TRIGLYCERIDES: 299 mg/dL — AB (ref 0.0–149.0)
VLDL: 59.8 mg/dL — AB (ref 0.0–40.0)

## 2017-02-09 LAB — LDL CHOLESTEROL, DIRECT: Direct LDL: 129 mg/dL

## 2017-02-09 LAB — TSH: TSH: 2.31 u[IU]/mL (ref 0.35–4.50)

## 2017-02-09 MED ORDER — CITALOPRAM HYDROBROMIDE 20 MG PO TABS: 20.0000 mg | ORAL_TABLET | Freq: Every day | ORAL | 3 refills | Status: DC

## 2017-02-09 MED ORDER — LOSARTAN POTASSIUM 50 MG PO TABS: 50.0000 mg | ORAL_TABLET | Freq: Every day | ORAL | 3 refills | Status: DC

## 2017-02-09 MED ORDER — VITAMIN D3 10 MCG (400 UNIT) PO TABS: 400.0000 [IU] | ORAL_TABLET | Freq: Two times a day (BID) | ORAL | 3 refills | Status: DC

## 2017-02-09 MED ORDER — AMLODIPINE BESYLATE 2.5 MG PO TABS: 2.5000 mg | ORAL_TABLET | Freq: Every day | ORAL | 3 refills | Status: DC

## 2017-02-09 NOTE — Progress Notes (Signed)
Subjective:    Patient ID: April Chung, female    DOB: 04/01/1945, 72 y.o.   MRN: [id number]  Patient presents today for complete physical HPI  Denies any acute complaint. Accompanied by husband.  HTN: Stable with losartan and amlodipine. BP Readings from Last 3 Encounters:  02/09/17 122/70  07/28/16 125/66  07/21/16 (!) 148/80   Depression: Stable mood with celexa.  Last dexa scan done 2017, stopped using actonel due to GI side effects. Current use of calcium and vitamin D only. Denies any fracture or falls in last 6months  Immunizations: (TDAP, Hep C screen, Pneumovax, Influenza, zoster)  Health Maintenance  Topic Date Due  . Mammogram  01/29/2018  . Colon Cancer Screening  03/28/2020  . Tetanus Vaccine  08/25/2022  . Flu Shot  Completed  . DEXA scan (bone density measurement)  Completed  .  Hepatitis C: One time screening is recommended by Center for Disease Control  (CDC) for  adults born from [birth year] through 1965.   Completed  . Pneumonia vaccines  Completed   Diet:regular.  Weight:  Wt Readings from Last 3 Encounters:  02/09/17 106 lb (48.1 kg)  07/28/16 104 lb 8 oz (47.4 kg)  07/21/16 106 lb (48.1 kg)    Exercise:walking daily.  Fall Risk: Fall Risk  02/09/2017 05/07/2016 02/05/2016 12/13/2013 08/24/2012  Falls in the past year? No No No No No   Home Safety:home with husband.  Depression/Suicide: Depression screen [facility] 2/9 02/09/2017 02/05/2016 12/13/2013 08/24/2012  Decreased Interest 0 0 0 0  Down, Depressed, Hopeless 0 0 1 0  PHQ - 2 Score 0 0 1 0  Altered sleeping 0 - - -  Tired, decreased energy 0 - - -  Change in appetite 0 - - -  Feeling bad or failure about yourself  0 - - -  Trouble concentrating 0 - - -  Moving slowly or fidgety/restless 0 - - -  Suicidal thoughts 0 - - -  PHQ-9 Score 0 - - -   Memory deficit managed by Dr. Anne HahnWillis. MMSE - Mini Mental State Exam 07/28/2016 02/07/2016 08/02/2015  Orientation to time 3 1 2   Orientation to Place 5 4 3     Registration 3 3 3   Attention/ Calculation 5 2 4   Recall 3 2 2   Language- name 2 objects 2 2 2   Language- repeat 1 1 1   Language- follow 3 step command 3 2 3   Language- read & follow direction 1 1 1   Write a sentence 1 1 1   Copy design 1 0 1  Total score 28 19 23    Dexa (every 2-5683yrs, >31683yrs):needed  Mammogram (yearly, >71483yrs):needed Vision: up to date with eye exam. Diagnosed with glaucoma, use of eye drops at this time.  Dental:up to date, done every 6months.  Advanced Directive: Advanced Directives 07/12/2016  Does Patient Have a Medical Advance Directive? No  Type of Advance Directive -  Copy of Healthcare Power of Attorney in Chart? -  Would patient like information on creating a medical advance directive? No - Patient declined   Medications and allergies reviewed with patient and updated if appropriate.  Patient Active Problem List   Diagnosis Date Noted  . Near syncope 07/28/2016  . Urinary incontinence in female 11/09/2012  . Osteopenia 11/09/2012  . Vitamin D deficiency 11/09/2012  . ADD (attention deficit disorder) 02/24/2012  . Seizure disorder (HCC)   . Hypertension   . Memory disturbance 12/08/2011  . Change in bowel habits   .  GERD (gastroesophageal reflux disease) 05/28/2011  . Glaucoma 05/28/2011  . Dermatomyositis (HCC) 05/28/2011    Current Outpatient Medications on File Prior to Visit  Medication Sig Dispense Refill  . aspirin 81 MG tablet Take 81 mg by mouth daily.    . ciprofloxacin (CIPRO) 250 MG tablet Take 250 mg by mouth 2 (two) times daily.  0  . donepezil (ARICEPT) 10 MG tablet Take 10 mg by mouth at bedtime.  11  . fish oil-omega-3 fatty acids 1000 MG capsule Take 2 g by mouth 2 (two) times daily after a meal.    . GNP CALCIUM 600 MG TABS tablet TAKE 1 TABLET BY MOUTH 2 TIMES DAILY 60 tablet 5  . latanoprost (XALATAN) 0.005 % ophthalmic solution   1  . levETIRAcetam (KEPPRA) 250 MG tablet TAKE 1 TABLET BY MOUTH 2 TIMES DAILY 60 tablet 11   . memantine (NAMENDA) 10 MG tablet TAKE 1 TABLET BY MOUTH 2 TIMES DAILY 60 tablet 5  . Multiple Vitamins-Calcium (ONE-A-DAY WOMENS PO) Take by mouth daily.    . Probiotic Product (ALIGN) 4 MG CAPS Take by mouth every morning. Reported on 08/02/2015    . ipratropium (ATROVENT) 0.03 % nasal spray Place 2 sprays into both nostrils 2 (two) times daily. Do not use for more than 5days. (Patient not taking: Reported on 02/09/2017) 30 mL 0   No current facility-administered medications on file prior to visit.     Past Medical History:  Diagnosis Date  . Abnormal EEG 01/14/12   started on Keppra  . Dermatomyositis (HCC)    remission on pred until 2008, rheum at [facility]  . GERD (gastroesophageal reflux disease)    agravated with Fosamax  . Glaucoma   . Hypertension 2013  . Memory loss   . PMB (postmenopausal bleeding) 08/2003  . Seizures (HCC)   . Status post dilation of esophageal narrowing   . Urinary incontinence, mixed 07/01/11   Fitted for 2.75 " Ring Pessary with support  . Vitamin D deficiency disease     Past Surgical History:  Procedure Laterality Date  . COLONOSCOPY  09/25/04   Dr. Loreta Ave  . COLONOSCOPY  02/2009  . ENDOMETRIAL BIOPSY  08/24/03   weak proliferative endo, [facility] neg.  Marland Kitchen ESOPHAGOGASTRODUODENOSCOPY ENDOSCOPY  04/29/04   anemia without cause  . NO PAST SURGERIES      Social History   Socioeconomic History  . Marital status: Married    Spouse name: [name]  . Number of children: 1  . Years of education: hs  . Highest education level: None  Social Needs  . Financial resource strain: None  . Food insecurity - worry: None  . Food insecurity - inability: None  . Transportation needs - medical: None  . Transportation needs - non-medical: None  Occupational History  . Occupation: housewife  Tobacco Use  . Smoking status: Former Smoker    Packs/day: 1.00    Years: 15.00    Pack years: 15.00    Types: Cigarettes    Last attempt to quit: 02/04/1976    Years  since quitting: 41.0  . Smokeless tobacco: Never Used  Substance and Sexual Activity  . Alcohol use: Yes    Alcohol/week: 4.2 oz    Types: 7 Glasses of wine per week    Comment: 1 glass of wine per day  . Drug use: No  . Sexual activity: Yes    Partners: Male    Birth control/protection: Post-menopausal  Other Topics Concern  .  None  Social History Narrative   Married, lives with spouse [name]).   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   Patient has one child.   Patient is right-handed.   Patient drinks 2-3 cups of coffee daily and maybe half cup at night.                Family History  Problem Relation Age of Onset  . Dementia Father        died age 89, otherwise healthy  . Hypertension Mother   . Heart disease Maternal Grandfather   . Heart disease Maternal Grandmother   . Seizures Neg Hx         Review of Systems  Constitutional: Negative for fever, malaise/fatigue and weight loss.  HENT: Negative for congestion and sore throat.   Eyes:       Negative for visual changes  Respiratory: Negative for cough and shortness of breath.   Cardiovascular: Negative for chest pain, palpitations and leg swelling.  Gastrointestinal: Negative for blood in stool, constipation, diarrhea and heartburn.  Genitourinary: Negative for dysuria, frequency and urgency.  Musculoskeletal: Negative for falls, joint pain and myalgias.  Skin: Negative for rash.  Neurological: Negative for dizziness, sensory change and headaches.  Endo/Heme/Allergies: Does not bruise/bleed easily.  Psychiatric/Behavioral: Positive for memory loss. Negative for depression, substance abuse and suicidal ideas. The patient is not nervous/anxious and does not have insomnia.     Objective:   Vitals:   02/09/17 0932  BP: 122/70  Pulse: (!) 46  Temp: (!) 97.5 F (36.4 C)  SpO2: 99%    Body mass index is 20.7 kg/m.   Physical  Examination:  Physical Exam  Constitutional: She is oriented to person, place, and time. No distress.  HENT:  Right Ear: External ear normal.  Left Ear: External ear normal.  Mouth/Throat: Oropharynx is clear and moist. No oropharyngeal exudate.  Eyes: Conjunctivae and EOM are normal. Pupils are equal, round, and reactive to light.  Neck: Normal range of motion. Neck supple. No thyromegaly present.  Cardiovascular: Normal rate, regular rhythm and normal heart sounds.  Pulmonary/Chest: Effort normal and breath sounds normal.  Abdominal: Soft. She exhibits no distension.  Musculoskeletal: She exhibits no edema.  Lymphadenopathy:    She has no cervical adenopathy.  Neurological: She is alert and oriented to person, place, and time.  Shuffling gait  Vitals reviewed.   ASSESSMENT and PLAN:  Vici was seen today for follow-up.  Diagnoses and all orders for this visit:  Encounter for preventative adult health care examination -     DG Bone Density; Future -     MM DIGITAL SCREENING BILATERAL; Future -     Comprehensive metabolic panel -     CBC with Differential/Platelet -     TSH -     Lipid panel  Mixed hyperlipidemia -     Lipid panel -     Lipid panel; Future  Osteopenia of multiple sites -     DG Bone Density; Future  Breast cancer screening by mammogram -     MM DIGITAL SCREENING BILATERAL; Future  Essential hypertension -     amLODipine (NORVASC) 2.5 MG tablet; Take 1 tablet (2.5 mg total) by mouth daily. -     losartan (COZAAR) 50 MG tablet; Take 1 tablet (50 mg total) by mouth daily.  Vitamin D deficiency -     DG Bone Density; Future -  Cholecalciferol (VITAMIN D3) 400 units tablet; Take 1 tablet (400 Units total) by mouth 2 (two) times daily.  Recurrent major depressive disorder, remission status unspecified (HCC) -     citalopram (CELEXA) 20 MG tablet; Take 1 tablet (20 mg total) by mouth daily.  Other orders -     LDL cholesterol, direct   No  problem-specific Assessment & Plan notes found for this encounter.     Follow up: Return in about 1 year (around 02/09/2018) for CPE(fasting).  Alysia Penna, NP

## 2017-02-09 NOTE — Patient Instructions (Addendum)
Please schedule appt with wellness coach for AWV.  Normal cbc, TSH, and cmp. Lipid panel indicates elevated LDL and triglyceride. Increase omega fatty acid to 2caps twice a day. will repeat in 56month (fasting). Go to lab for blood draw.  You will be contacted to schedule appt for mammogram and bone density.  Consider use of prolia if persistent osteoporosis.  Health Maintenance, Female Adopting a healthy lifestyle and getting preventive care can go a long way to promote health and wellness. Talk with your health care provider about what schedule of regular examinations is right for you. This is a good chance for you to check in with your provider about disease prevention and staying healthy. In between checkups, there are plenty of things you can do on your own. Experts have done a lot of research about which lifestyle changes and preventive measures are most likely to keep you healthy. Ask your health care provider for more information. Weight and diet Eat a healthy diet  Be sure to include plenty of vegetables, fruits, low-fat dairy products, and lean protein.  Do not eat a lot of foods high in solid fats, added sugars, or salt.  Get regular exercise. This is one of the most important things you can do for your health. ? Most adults should exercise for at least 150 minutes each week. The exercise should increase your heart rate and make you sweat (moderate-intensity exercise). ? Most adults should also do strengthening exercises at least twice a week. This is in addition to the moderate-intensity exercise.  Maintain a healthy weight  Body mass index (BMI) is a measurement that can be used to identify possible weight problems. It estimates body fat based on height and weight. Your health care provider can help determine your BMI and help you achieve or maintain a healthy weight.  For females 228years of age and older: ? A BMI below 18.5 is considered underweight. ? A BMI of 18.5 to  24.9 is normal. ? A BMI of 25 to 29.9 is considered overweight. ? A BMI of 30 and above is considered obese.  Watch levels of cholesterol and blood lipids  You should start having your blood tested for lipids and cholesterol at 74years of age, then have this test every 5 years.  You may need to have your cholesterol levels checked more often if: ? Your lipid or cholesterol levels are high. ? You are older than 74years of age. ? You are at high risk for heart disease.  Cancer screening Lung Cancer  Lung cancer screening is recommended for adults 7485years old who are at high risk for lung cancer because of a history of smoking.  A yearly low-dose CT scan of the lungs is recommended for people who: ? Currently smoke. ? Have quit within the past 15 years. ? Have at least a 30-pack-year history of smoking. A pack year is smoking an average of one pack of cigarettes a day for 1 year.  Yearly screening should continue until it has been 15 years since you quit.  Yearly screening should stop if you develop a health problem that would prevent you from having lung cancer treatment.  Breast Cancer  Practice breast self-awareness. This means understanding how your breasts normally appear and feel.  It also means doing regular breast self-exams. Let your health care provider know about any changes, no matter how small.  If you are in your 20s or 30s, you should have a clinical breast exam (  CBE) by a health care provider every 1-3 years as part of a regular health exam.  If you are 70 or older, have a CBE every year. Also consider having a breast X-ray (mammogram) every year.  If you have a family history of breast cancer, talk to your health care provider about genetic screening.  If you are at high risk for breast cancer, talk to your health care provider about having an MRI and a mammogram every year.  Breast cancer gene (BRCA) assessment is recommended for women who have family  members with BRCA-related cancers. BRCA-related cancers include: ? Breast. ? Ovarian. ? Tubal. ? Peritoneal cancers.  Results of the assessment will determine the need for genetic counseling and BRCA1 and BRCA2 testing.  Cervical Cancer Your health care provider may recommend that you be screened regularly for cancer of the pelvic organs (ovaries, uterus, and vagina). This screening involves a pelvic examination, including checking for microscopic changes to the surface of your cervix (Pap test). You may be encouraged to have this screening done every 3 years, beginning at age 74.  For women ages 48-65, health care providers may recommend pelvic exams and Pap testing every 3 years, or they may recommend the Pap and pelvic exam, combined with testing for human papilloma virus (HPV), every 5 years. Some types of HPV increase your risk of cervical cancer. Testing for HPV may also be done on women of any age with unclear Pap test results.  Other health care providers may not recommend any screening for nonpregnant women who are considered low risk for pelvic cancer and who do not have symptoms. Ask your health care provider if a screening pelvic exam is right for you.  If you have had past treatment for cervical cancer or a condition that could lead to cancer, you need Pap tests and screening for cancer for at least 20 years after your treatment. If Pap tests have been discontinued, your risk factors (such as having a new sexual partner) need to be reassessed to determine if screening should resume. Some women have medical problems that increase the chance of getting cervical cancer. In these cases, your health care provider may recommend more frequent screening and Pap tests.  Colorectal Cancer  This type of cancer can be detected and often prevented.  Routine colorectal cancer screening usually begins at 74 years of age and continues through 74 years of age.  Your health care provider may  recommend screening at an earlier age if you have risk factors for colon cancer.  Your health care provider may also recommend using home test kits to check for hidden blood in the stool.  A small camera at the end of a tube can be used to examine your colon directly (sigmoidoscopy or colonoscopy). This is done to check for the earliest forms of colorectal cancer.  Routine screening usually begins at age 37.  Direct examination of the colon should be repeated every 5-10 years through 74 years of age. However, you may need to be screened more often if early forms of precancerous polyps or small growths are found.  Skin Cancer  Check your skin from head to toe regularly.  Tell your health care provider about any new moles or changes in moles, especially if there is a change in a mole's shape or color.  Also tell your health care provider if you have a mole that is larger than the size of a pencil eraser.  Always use sunscreen. Apply sunscreen liberally  and repeatedly throughout the day.  Protect yourself by wearing long sleeves, pants, a wide-brimmed hat, and sunglasses whenever you are outside.  Heart disease, diabetes, and high blood pressure  High blood pressure causes heart disease and increases the risk of stroke. High blood pressure is more likely to develop in: ? People who have blood pressure in the high end of the normal range (130-139/85-89 mm Hg). ? People who are overweight or obese. ? People who are African American.  If you are 56-44 years of age, have your blood pressure checked every 3-5 years. If you are 76 years of age or older, have your blood pressure checked every year. You should have your blood pressure measured twice-once when you are at a hospital or clinic, and once when you are not at a hospital or clinic. Record the average of the two measurements. To check your blood pressure when you are not at a hospital or clinic, you can use: ? An automated blood pressure  machine at a pharmacy. ? A home blood pressure monitor.  If you are between 8 years and 59 years old, ask your health care provider if you should take aspirin to prevent strokes.  Have regular diabetes screenings. This involves taking a blood sample to check your fasting blood sugar level. ? If you are at a normal weight and have a low risk for diabetes, have this test once every three years after 74 years of age. ? If you are overweight and have a high risk for diabetes, consider being tested at a younger age or more often. Preventing infection Hepatitis B  If you have a higher risk for hepatitis B, you should be screened for this virus. You are considered at high risk for hepatitis B if: ? You were born in a country where hepatitis B is common. Ask your health care provider which countries are considered high risk. ? Your parents were born in a high-risk country, and you have not been immunized against hepatitis B (hepatitis B vaccine). ? You have HIV or AIDS. ? You use needles to inject street drugs. ? You live with someone who has hepatitis B. ? You have had sex with someone who has hepatitis B. ? You get hemodialysis treatment. ? You take certain medicines for conditions, including cancer, organ transplantation, and autoimmune conditions.  Hepatitis C  Blood testing is recommended for: ? Everyone born from [birth year] through 1967. ? Anyone with known risk factors for hepatitis C.  Sexually transmitted infections (STIs)  You should be screened for sexually transmitted infections (STIs) including gonorrhea and chlamydia if: ? You are sexually active and are younger than 74 years of age. ? You are older than 74 years of age and your health care provider tells you that you are at risk for this type of infection. ? Your sexual activity has changed since you were last screened and you are at an increased risk for chlamydia or gonorrhea. Ask your health care provider if you are at  risk.  If you do not have HIV, but are at risk, it may be recommended that you take a prescription medicine daily to prevent HIV infection. This is called pre-exposure prophylaxis (PrEP). You are considered at risk if: ? You are sexually active and do not regularly use condoms or know the HIV status of your partner(s). ? You take drugs by injection. ? You are sexually active with a partner who has HIV.  Talk with your health care provider about whether  you are at high risk of being infected with HIV. If you choose to begin PrEP, you should first be tested for HIV. You should then be tested every 3 months for as long as you are taking PrEP. Pregnancy  If you are premenopausal and you may become pregnant, ask your health care provider about preconception counseling.  If you may become pregnant, take 400 to 800 micrograms (mcg) of folic acid every day.  If you want to prevent pregnancy, talk to your health care provider about birth control (contraception). Osteoporosis and menopause  Osteoporosis is a disease in which the bones lose minerals and strength with aging. This can result in serious bone fractures. Your risk for osteoporosis can be identified using a bone density scan.  If you are 51 years of age or older, or if you are at risk for osteoporosis and fractures, ask your health care provider if you should be screened.  Ask your health care provider whether you should take a calcium or vitamin D supplement to lower your risk for osteoporosis.  Menopause may have certain physical symptoms and risks.  Hormone replacement therapy may reduce some of these symptoms and risks. Talk to your health care provider about whether hormone replacement therapy is right for you. Follow these instructions at home:  Schedule regular health, dental, and eye exams.  Stay current with your immunizations.  Do not use any tobacco products including cigarettes, chewing tobacco, or electronic  cigarettes.  If you are pregnant, do not drink alcohol.  If you are breastfeeding, limit how much and how often you drink alcohol.  Limit alcohol intake to no more than 1 drink per day for nonpregnant women. One drink equals 12 ounces of beer, 5 ounces of wine, or 1 ounces of hard liquor.  Do not use street drugs.  Do not share needles.  Ask your health care provider for help if you need support or information about quitting drugs.  Tell your health care provider if you often feel depressed.  Tell your health care provider if you have ever been abused or do not feel safe at home. This information is not intended to replace advice given to you by your health care provider. Make sure you discuss any questions you have with your health care provider. Document Released: 08/05/2010 Document Revised: 06/28/2015 Document Reviewed: 10/24/2014 Elsevier Interactive Patient Education  [employer].

## 2017-02-10 ENCOUNTER — Ambulatory Visit: Payer: Medicare Other | Admitting: Neurology

## 2017-02-10 ENCOUNTER — Encounter: Payer: Self-pay | Admitting: Neurology

## 2017-02-10 ENCOUNTER — Encounter: Payer: Self-pay | Admitting: Nurse Practitioner

## 2017-02-10 VITALS — BP 146/75 | HR 49 | Ht 60.0 in | Wt 105.5 lb

## 2017-02-10 DIAGNOSIS — R413 Other amnesia: Secondary | ICD-10-CM

## 2017-02-10 DIAGNOSIS — G40909 Epilepsy, unspecified, not intractable, without status epilepticus: Secondary | ICD-10-CM | POA: Diagnosis not present

## 2017-02-10 NOTE — Progress Notes (Signed)
Reason for visit: Seizures  April Chung is an 72 y.o. female  History of present illness:  April Chung is a 72 year old right-handed white female with a history of a mild memory disturbance and history of seizures.  The patient is on Aricept taking 10 mg daily, she is on Namenda taking 10 mg twice daily.  She tolerates these medications well.  She is on low-dose Keppra, she tolerates this drug also without drowsiness.  She has not had any recurring seizures since last seen, she does operate a motor vehicle without difficulty.  The patient has not noted any progression of balance issues, she denies any progression of memory.  She returns to this office for an evaluation.  Past Medical History:  Diagnosis Date  . Abnormal EEG 01/14/12   started on Keppra  . Dermatomyositis (HCC)    remission on pred until 2008, rheum at [facility]  . GERD (gastroesophageal reflux disease)    agravated with Fosamax  . Glaucoma   . Hypertension 2013  . Memory loss   . PMB (postmenopausal bleeding) 08/2003  . Seizures (HCC)   . Status post dilation of esophageal narrowing   . Urinary incontinence, mixed 07/01/11   Fitted for 2.75 " Ring Pessary with support  . Vitamin D deficiency disease     Past Surgical History:  Procedure Laterality Date  . COLONOSCOPY  09/25/04   Dr. Loreta Ave  . COLONOSCOPY  02/2009  . ENDOMETRIAL BIOPSY  08/24/03   weak proliferative endo, SHGM neg.  Marland Kitchen ESOPHAGOGASTRODUODENOSCOPY ENDOSCOPY  04/29/04   anemia without cause  . NO PAST SURGERIES      Family History  Problem Relation Age of Onset  . Dementia Father        died age 66, otherwise healthy  . Hypertension Mother   . Heart disease Maternal Grandfather   . Heart disease Maternal Grandmother   . Seizures Neg Hx     Social history:  reports that she quit smoking about 41 years ago. Her smoking use included cigarettes. She has a 15.00 pack-year smoking history. she has never used smokeless tobacco. She reports  that she drinks about 4.2 oz of alcohol per week. She reports that she does not use drugs.    Allergies  Allergen Reactions  . Plaquenil [Hydroxychloroquine Sulfate]   . Hydroxychloroquine Rash    Medications:  Prior to Admission medications   Medication Sig Start Date End Date Taking? Authorizing Provider  amLODipine (NORVASC) 2.5 MG tablet Take 1 tablet (2.5 mg total) by mouth daily. 02/09/17  Yes Nche, Bonna Gains, NP  aspirin 81 MG tablet Take 81 mg by mouth daily.   Yes [provider]  Cholecalciferol (VITAMIN D3) 400 units tablet Take 1 tablet (400 Units total) by mouth 2 (two) times daily. 02/09/17  Yes Nche, Bonna Gains, NP  ciprofloxacin (CIPRO) 250 MG tablet Take 250 mg by mouth 2 (two) times daily. 07/21/16  Yes [provider]  citalopram (CELEXA) 20 MG tablet Take 1 tablet (20 mg total) by mouth daily. 02/09/17  Yes Nche, Bonna Gains, NP  donepezil (ARICEPT) 10 MG tablet Take 10 mg by mouth at bedtime. 04/28/16  Yes [provider]  fish oil-omega-3 fatty acids 1000 MG capsule Take 2 g by mouth 2 (two) times daily after a meal.   Yes [provider]  GNP CALCIUM 600 MG TABS tablet TAKE 1 TABLET BY MOUTH 2 TIMES DAILY 02/04/17  Yes Nche, Bonna Gains, NP  latanoprost (XALATAN) 0.005 % ophthalmic solution  11/12/15  Yes [provider]  levETIRAcetam (KEPPRA) 250 MG tablet TAKE 1 TABLET BY MOUTH 2 TIMES DAILY 01/08/17  Yes York SpanielWillis, Diamond Martucci K, MD  losartan (COZAAR) 50 MG tablet Take 1 tablet (50 mg total) by mouth daily. 02/09/17  Yes Nche, Bonna Gainsharlotte Lum, NP  memantine (NAMENDA) 10 MG tablet TAKE 1 TABLET BY MOUTH 2 TIMES DAILY 01/08/17  Yes York SpanielWillis, Ismeal Heider K, MD  Multiple Vitamins-Calcium (ONE-A-DAY WOMENS PO) Take by mouth daily.   Yes [provider]  Probiotic Product (ALIGN) 4 MG CAPS Take by mouth every morning. Reported on 08/02/2015   Yes [provider]    ROS:  Out of a complete 14 system review of symptoms, the  patient complains only of the following symptoms, and all other reviewed systems are negative.  Mild memory disturbance  Blood pressure (!) 146/75, pulse (!) 49, height 5' (1.524 m), weight 105 lb 8 oz (47.9 kg), last menstrual period 08/03/1993.  Physical Exam  General: The patient is alert and cooperative at the time of the examination.  Skin: No significant peripheral edema is noted.   Neurologic Exam  Mental status: The patient is alert and oriented x 3 at the time of the examination. The patient has apparent normal recent and remote memory, with an apparently normal attention span and concentration ability.  Mini-Mental status examination done today shows a total score of 30/30.   Cranial nerves: Facial symmetry is present. Speech is normal, no aphasia or dysarthria is noted. Extraocular movements are full. Visual fields are full.  Motor: The patient has good strength in all 4 extremities.  Sensory examination: Soft touch sensation is symmetric on the face, arms, and legs.  Coordination: The patient has good finger-nose-finger and heel-to-shin bilaterally.  Gait and station: The patient has a minimally wide-based gait, the patient can walk independently. Tandem gait is unsteady. Romberg is negative. No drift is seen.  Reflexes: Deep tendon reflexes are symmetric.   Assessment/Plan:  1.  Mild memory disturbance  2.  History of seizures, well controlled  The patient will continue on the Keppra, Namenda, and Aricept.  The patient will follow-up in 1 year.  She will contact our office if any new issues arise.  Marlan Palau. Keith Moon Budde MD 02/10/2017 12:26 PM  [facility] [address] [place], [place] [id number]  Phone [phone] Fax [phone]

## 2017-02-23 ENCOUNTER — Other Ambulatory Visit (HOSPITAL_BASED_OUTPATIENT_CLINIC_OR_DEPARTMENT_OTHER): Payer: Self-pay

## 2017-02-23 ENCOUNTER — Ambulatory Visit
Admission: RE | Admit: 2017-02-23 | Discharge: 2017-02-23 | Disposition: A | Discharge status: Home / Self Care | Payer: Medicare Other | Source: Ambulatory Visit | Attending: Nurse Practitioner | Admitting: Nurse Practitioner

## 2017-02-23 DIAGNOSIS — Z0000 Encounter for general adult medical examination without abnormal findings: Secondary | ICD-10-CM

## 2017-02-23 DIAGNOSIS — M8589 Other specified disorders of bone density and structure, multiple sites: Secondary | ICD-10-CM

## 2017-02-23 DIAGNOSIS — E559 Vitamin D deficiency, unspecified: Secondary | ICD-10-CM

## 2017-02-27 NOTE — Progress Notes (Deleted)
Subjective:   April Chung is a 72 y.o. female who presents for Medicare Annual (Subsequent) preventive examination.  Review of Systems: No ROS.  Medicare Wellness Visit. Additional risk factors are reflected in the social history.   Sleep patterns:    Home Safety/Smoke Alarms: Feels safe in home. Smoke alarms in place.    Female:   Pap-   Last 01/01/15-normal    Mammo- active order. Last 01/30/16-normal.       Dexa scan- active order. Last 01/09/06: ostoeoporosis. CCS- last 03/28/10: recall 5 yrs    Objective:     Vitals: LMP 08/03/1993 (Approximate)   There is no height or weight on file to calculate BMI.  Advanced Directives 07/12/2016 08/02/2014  Does Patient Have a Medical Advance Directive? No Yes  Type of Advance Directive - Healthcare Power of [place];Living will  Copy of Healthcare Power of Attorney in Chart? - No - copy requested  Would patient like information on creating a medical advance directive? No - Patient declined -    Tobacco Social History   Tobacco Use  Smoking Status Former Smoker  . Packs/day: 1.00  . Years: 15.00  . Pack years: 15.00  . Types: Cigarettes  . Last attempt to quit: 02/04/1976  . Years since quitting: 41.0  Smokeless Tobacco Never Used     Counseling given: Not Answered   Clinical Intake:                       Past Medical History:  Diagnosis Date  . Abnormal EEG 01/14/12   started on Keppra  . Dermatomyositis (HCC)    remission on pred until 2008, rheum at [facility]  . GERD (gastroesophageal reflux disease)    agravated with Fosamax  . Glaucoma   . Hypertension 2013  . Memory loss   . PMB (postmenopausal bleeding) 08/2003  . Seizures (HCC)   . Status post dilation of esophageal narrowing   . Urinary incontinence, mixed 07/01/11   Fitted for 2.75 " Ring Pessary with support  . Vitamin D deficiency disease    Past Surgical History:  Procedure Laterality Date  . COLONOSCOPY  09/25/04   Dr. Loreta Ave  .  COLONOSCOPY  02/2009  . ENDOMETRIAL BIOPSY  08/24/03   weak proliferative endo, SHGM neg.  Marland Kitchen ESOPHAGOGASTRODUODENOSCOPY ENDOSCOPY  04/29/04   anemia without cause  . NO PAST SURGERIES     Family History  Problem Relation Age of Onset  . Dementia Father        died age 51, otherwise healthy  . Hypertension Mother   . Heart disease Maternal Grandfather   . Heart disease Maternal Grandmother   . Seizures Neg Hx    Social History   Socioeconomic History  . Marital status: Married    Spouse name: April Chung  . Number of children: 1  . Years of education: hs  . Highest education level: Not on file  Social Needs  . Financial resource strain: Not on file  . Food insecurity - worry: Not on file  . Food insecurity - inability: Not on file  . Transportation needs - medical: Not on file  . Transportation needs - non-medical: Not on file  Occupational History  . Occupation: housewife  Tobacco Use  . Smoking status: Former Smoker    Packs/day: 1.00    Years: 15.00    Pack years: 15.00    Types: Cigarettes    Last attempt to quit: 02/04/1976  Years since quitting: 41.0  . Smokeless tobacco: Never Used  Substance and Sexual Activity  . Alcohol use: Yes    Alcohol/week: 4.2 oz    Types: 7 Glasses of wine per week    Comment: 1 glass of wine per day  . Drug use: No  . Sexual activity: Yes    Partners: Male    Birth control/protection: Post-menopausal  Other Topics Concern  . Not on file  Social History Narrative   Married, lives with spouse April Chung(April Chung).   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   Patient has one child.   Patient is right-handed.   Patient drinks 2-3 cups of coffee daily and maybe half cup at night.                Outpatient Encounter Medications as of 03/04/2017  Medication Sig  . amLODipine (NORVASC) 2.5 MG tablet Take 1 tablet (2.5 mg total) by mouth daily.  Marland Kitchen. aspirin 81 MG tablet Take 81 mg by mouth  daily.  . Cholecalciferol (VITAMIN D3) 400 units tablet Take 1 tablet (400 Units total) by mouth 2 (two) times daily.  . ciprofloxacin (CIPRO) 250 MG tablet Take 250 mg by mouth 2 (two) times daily.  . citalopram (CELEXA) 20 MG tablet Take 1 tablet (20 mg total) by mouth daily.  Marland Kitchen. donepezil (ARICEPT) 10 MG tablet Take 10 mg by mouth at bedtime.  . fish oil-omega-3 fatty acids 1000 MG capsule Take 2 g by mouth 2 (two) times daily after a meal.  . GNP CALCIUM 600 MG TABS tablet TAKE 1 TABLET BY MOUTH 2 TIMES DAILY  . latanoprost (XALATAN) 0.005 % ophthalmic solution   . levETIRAcetam (KEPPRA) 250 MG tablet TAKE 1 TABLET BY MOUTH 2 TIMES DAILY  . losartan (COZAAR) 50 MG tablet Take 1 tablet (50 mg total) by mouth daily.  . memantine (NAMENDA) 10 MG tablet TAKE 1 TABLET BY MOUTH 2 TIMES DAILY  . Multiple Vitamins-Calcium (ONE-A-DAY WOMENS PO) Take by mouth daily.  . Probiotic Product (ALIGN) 4 MG CAPS Take by mouth every morning. Reported on 08/02/2015   No facility-administered encounter medications on file as of 03/04/2017.     Activities of Daily Living No flowsheet data found.  Patient Care Team: Nche, Bonna Gainsharlotte Lum, NP as PCP - General (Internal Medicine) Romine, Edwena Feltyynthia P, MD (Obstetrics and Gynecology) [place], Heather, [place] (Optometry) Hilarie FredricksonPerry, John N, MD (Gastroenterology) York SpanielWillis, Charles K, MD (Neurology)    Assessment:   This is a routine wellness examination for April Chung. Physical assessment deferred to PCP.  Exercise Activities and Dietary recommendations   Diet (meal preparation, eat out, water intake, caffeinated beverages, dairy products, fruits and vegetables): {Desc; diets:16563} Breakfast: Lunch:  Dinner:      Goals    None      Fall Risk Fall Risk  02/09/2017 05/07/2016 02/05/2016 12/13/2013 08/24/2012  Falls in the past year? No No No No No    Depression Screen PHQ 2/9 Scores 02/09/2017 02/05/2016 12/13/2013 08/24/2012  PHQ - 2 Score 0 0 1 0  PHQ- 9 Score 0 - - -      Cognitive Function MMSE - Mini Mental State Exam 02/10/2017 07/28/2016 02/07/2016 08/02/2015  Orientation to time 5 3 1 2   Orientation to Place 5 5 4 3   Registration 3 3 3 3   Attention/ Calculation 5 5 2 4   Recall 3 3 2 2   Language- name 2 objects 2 2 2  2  Language- repeat 1 1 1 1   Language- follow 3 step command 3 3 2 3   Language- read & follow direction 1 1 1 1   Write a sentence 1 1 1 1   Copy design 1 1 0 1  Total score 30 28 19 23         Immunization History  Administered Date(s) Administered  . Influenza Split 11/04/2010  . Influenza, High Dose Seasonal PF 11/08/2016  . Influenza-Unspecified 11/17/2013  . Pneumococcal Conjugate-13 12/13/2013  . Pneumococcal-Unspecified 11/12/2011  . Tdap 08/24/2012   Screening Tests Health Maintenance  Topic Date Due  . MAMMOGRAM  01/29/2018  . COLONOSCOPY  03/28/2020  . TETANUS/TDAP  08/25/2022  . INFLUENZA VACCINE  Completed  . DEXA SCAN  Completed  . Hepatitis C Screening  Completed  . PNA vac Low Risk Adult  Completed       Plan:   ***   I have personally reviewed and noted the following in the patient's chart:   . Medical and social history . Use of alcohol, tobacco or illicit drugs  . Current medications and supplements . Functional ability and status . Nutritional status . Physical activity . Advanced directives . List of other physicians . Hospitalizations, surgeries, and ER visits in previous 12 months . Vitals . Screenings to include cognitive, depression, and falls . Referrals and appointments  In addition, I have reviewed and discussed with patient certain preventive protocols, quality metrics, and best practice recommendations. A written personalized care plan for preventive services as well as general preventive health recommendations were provided to patient.     [place]on [place], [place]  02/27/2017

## 2017-03-04 ENCOUNTER — Ambulatory Visit: Payer: Medicare Other | Admitting: Behavioral Health

## 2017-03-09 ENCOUNTER — Ambulatory Visit (INDEPENDENT_AMBULATORY_CARE_PROVIDER_SITE_OTHER): Payer: Medicare Other | Admitting: Obstetrics and Gynecology

## 2017-03-09 ENCOUNTER — Other Ambulatory Visit: Payer: Self-pay

## 2017-03-09 ENCOUNTER — Encounter: Payer: Self-pay | Admitting: Obstetrics and Gynecology

## 2017-03-09 ENCOUNTER — Other Ambulatory Visit (HOSPITAL_COMMUNITY)
Admission: RE | Admit: 2017-03-09 | Discharge: 2017-03-09 | Disposition: A | Discharge status: Home / Self Care | Payer: Medicare Other | Source: Ambulatory Visit | Attending: Obstetrics & Gynecology | Admitting: Obstetrics & Gynecology

## 2017-03-09 VITALS — BP 150/90 | HR 52 | Resp 12 | Ht 60.0 in | Wt 104.0 lb

## 2017-03-09 DIAGNOSIS — Z01419 Encounter for gynecological examination (general) (routine) without abnormal findings: Secondary | ICD-10-CM

## 2017-03-09 DIAGNOSIS — Z124 Encounter for screening for malignant neoplasm of cervix: Secondary | ICD-10-CM

## 2017-03-09 NOTE — Progress Notes (Signed)
Patient scheduled while in office. [facility] with [facility] at [facility]. Patient scheduled for evaluation of cardiac arrhythmia on 03/10/17 arriving at 10:45am for 11am appt with [name], NP. Patient verbalizes understanding and is agreeable to date and time.

## 2017-03-09 NOTE — Progress Notes (Signed)
72 y.o. G62P1011 Married Caucasian female here for annual exam.    No vaginal spotting.   ROS - Sinusitis, heat/cold intolerance - feeling more cold, frequency urination and urgency - improved.  No urinary incontinence. No nocturia.   2 grandchildren. 5 and 7 yo.  Labs with PCP.   PCP:  Alysia Penna, NP - [facility].   Patient's last menstrual period was 08/03/1993 (approximate).           Sexually active: No.  The current method  of family planning is post menopausal status.    Exercising: Yes.    walking Smoker: former smoker   Health Maintenance: Pap: 01-01-15 WNL  08-14-08 WNL  History of abnormal Pap:  No MMG:  12- 27-17 NEG BI RAD 1, Cat D- Scheduled 03-18-17  Colonoscopy: 03-28-10 WNL - Repeat in 5 yrs.    BMD:   01/08/16  Result  01/08/16, T Score: +1.0 Spine / -2.3 Right Femur Neck / -1.7 Left Femur Neck TDaP:  08/24/12 HIV: unsure Hep C: 01/01/15 Negative Screening Labs:  PCP does labs   reports that she quit smoking about 41 years ago. Her smoking use included cigarettes. She has a 15.00 pack-year smoking history. she has never used smokeless tobacco. She reports that she drinks about 4.2 oz of alcohol per week. She reports that she does not use drugs.  Past Medical History:  Diagnosis Date  . Abnormal EEG 01/14/12   started on Keppra  . Dermatomyositis (HCC)    remission on pred until 2008, rheum at [facility]  . GERD (gastroesophageal reflux disease)    agravated with Fosamax  . Glaucoma   . Hypertension 2013  . Memory loss   . PMB (postmenopausal bleeding) 08/2003  . Seizures (HCC)   . Status post dilation of esophageal narrowing   . Urinary incontinence, mixed 07/01/11   Fitted for 2.75 " Ring Pessary with support  . Vitamin D deficiency disease     Past Surgical History:  Procedure Laterality Date  . COLONOSCOPY  09/25/04   Dr. Loreta Ave  . COLONOSCOPY  02/2009  . ENDOMETRIAL BIOPSY  08/24/03   weak proliferative endo, SHGM neg.  Marland Kitchen  ESOPHAGOGASTRODUODENOSCOPY ENDOSCOPY  04/29/04   anemia without cause  . NO PAST SURGERIES      Current Outpatient Medications  Medication Sig Dispense Refill  . amLODipine (NORVASC) 2.5 MG tablet Take 1 tablet (2.5 mg total) by mouth daily. 90 tablet 3  . aspirin 81 MG tablet Take 81 mg by mouth daily.    . citalopram (CELEXA) 20 MG tablet Take 1 tablet (20 mg total) by mouth daily. 90 tablet 3  . donepezil (ARICEPT) 10 MG tablet Take 10 mg by mouth at bedtime.  11  . fish oil-omega-3 fatty acids 1000 MG capsule Take 2 g by mouth 2 (two) times daily after a meal.    . GNP CALCIUM 600 MG TABS tablet TAKE 1 TABLET BY MOUTH 2 TIMES DAILY 60 tablet 5  . latanoprost (XALATAN) 0.005 % ophthalmic solution   1  . levETIRAcetam (KEPPRA) 250 MG tablet TAKE 1 TABLET BY MOUTH 2 TIMES DAILY 60 tablet 11  . losartan (COZAAR) 50 MG tablet Take 1 tablet (50 mg total) by mouth daily. 90 tablet 3  . medroxyPROGESTERone (PROVERA) 2.5 MG tablet Take 2.5 mg by mouth daily.    . memantine (NAMENDA) 10 MG tablet TAKE 1 TABLET BY MOUTH 2 TIMES DAILY 60 tablet 5  . Multiple Vitamins-Calcium (ONE-A-DAY WOMENS PO)  Take by mouth daily.    . Probiotic Product (ALIGN) 4 MG CAPS Take by mouth every morning. Reported on 08/02/2015     No current facility-administered medications for this visit.     Family History  Problem Relation Age of Onset  . Dementia Father        died age 74, otherwise healthy  . Hypertension Mother   . Heart disease Maternal Grandfather   . Heart disease Maternal Grandmother   . Seizures Neg Hx     ROS:  Pertinent items are noted in HPI.  Otherwise, a comprehensive ROS was negative.  Exam:   BP (!) 150/90 (BP Location: Right Arm, Patient Position: Sitting, Cuff Size: Normal)   Pulse (!) 52   Resp 12   Ht 5' (1.524 m)   Wt 104 lb (47.2 kg)   LMP 08/03/1993 (Approximate)   BMI 20.31 kg/m     General appearance: alert, cooperative and appears stated age Head: Normocephalic, without  obvious abnormality, atraumatic Neck: no adenopathy, supple, symmetrical, trachea midline and thyroid normal to inspection and palpation Lungs: clear to auscultation bilaterally Breasts: normal appearance, no masses or tenderness, No nipple retraction or dimpling, No nipple discharge or bleeding, No axillary or supraclavicular adenopathy Heart: regular rate and rhythm.  Occasional S3? Abdomen: soft, non-tender; no masses, no organomegaly Extremities: extremities normal, atraumatic, no cyanosis or edema Skin: Skin color, texture, turgor normal. No rashes or lesions Lymph nodes: Cervical, supraclavicular, and axillary nodes normal. No abnormal inguinal nodes palpated Neurologic: Grossly normal  Pelvic: External genitalia:  no lesions              Urethra:  normal appearing urethra with no masses, tenderness or lesions              Bartholins and Skenes: normal                 Vagina: normal appearing vagina with normal color and discharge, no lesions              Cervix: no lesions              Pap taken: Yes.   Bimanual Exam:  Uterus:  normal size, contour, position, consistency, mobility, non-tender              Adnexa: no mass, fullness, tenderness              Rectal exam: Yes.  .  Confirms.              Anus:  normal sphincter tone, no lesions  Chaperone was present for exam.  Assessment:   Well woman visit with normal exam. Cardiac arrhythmia.  PACs versus PVCs? Mixed incontinence.  Hx pessary use.  Osteopenia.  Mild cognitive impairment.   Plan: Mammogram screening discussed. Discussed 3D.  Recommended self breast awareness. Pap and HR HPV as above. Guidelines for Calcium, Vitamin D, regular exercise program including cardiovascular and weight bearing exercise. BMD next year.  To see PCP regarding arrhythmia.  Follow up annually and prn.   After visit summary provided.

## 2017-03-09 NOTE — Patient Instructions (Signed)

## 2017-03-10 ENCOUNTER — Ambulatory Visit: Payer: Medicare Other | Admitting: Nurse Practitioner

## 2017-03-10 ENCOUNTER — Encounter: Payer: Self-pay | Admitting: Nurse Practitioner

## 2017-03-10 VITALS — BP 146/80 | HR 52 | Temp 97.7°F | Ht 60.0 in | Wt 104.0 lb

## 2017-03-10 DIAGNOSIS — R012 Other cardiac sounds: Secondary | ICD-10-CM

## 2017-03-10 NOTE — Progress Notes (Signed)
Subjective:  Patient ID: April Chung, female    DOB: [birth year]  Age: 72 y.o. MRN: [id number]  CC: Follow-up (follow up on heart murmur detected yesterday from GYN)  Accompanied by Husband today.  April Chung was referred to me by Dr. Ricki Miller (GYN) for further evaluation of possible heart murmur found during her recent OV. She denies any new compliant or medication.  Heart Problem  This is a new problem. Episode onset: unknown. The problem has been unchanged. Pertinent negatives include no chest pain, fatigue, numbness, vertigo or weakness. Associated symptoms comments: No palpitation, no SON, no PND. No edema. Nothing aggravates the symptoms.   ECG: reviewed previous ECG done 07/2016 and 02/2008 (no change noted). Outpatient Medications Prior to Visit  Medication Sig Dispense Refill  . amLODipine (NORVASC) 2.5 MG tablet Take 1 tablet (2.5 mg total) by mouth daily. 90 tablet 3  . aspirin 81 MG tablet Take 81 mg by mouth daily.    . citalopram (CELEXA) 20 MG tablet Take 1 tablet (20 mg total) by mouth daily. 90 tablet 3  . donepezil (ARICEPT) 10 MG tablet Take 10 mg by mouth at bedtime.  11  . fish oil-omega-3 fatty acids 1000 MG capsule Take 2 g by mouth 2 (two) times daily after a meal.    . GNP CALCIUM 600 MG TABS tablet TAKE 1 TABLET BY MOUTH 2 TIMES DAILY 60 tablet 5  . latanoprost (XALATAN) 0.005 % ophthalmic solution   1  . levETIRAcetam (KEPPRA) 250 MG tablet TAKE 1 TABLET BY MOUTH 2 TIMES DAILY 60 tablet 11  . losartan (COZAAR) 50 MG tablet Take 1 tablet (50 mg total) by mouth daily. 90 tablet 3  . medroxyPROGESTERone (PROVERA) 2.5 MG tablet Take 2.5 mg by mouth daily.    . memantine (NAMENDA) 10 MG tablet TAKE 1 TABLET BY MOUTH 2 TIMES DAILY 60 tablet 5  . Multiple Vitamins-Calcium (ONE-A-DAY WOMENS PO) Take by mouth daily.    . Probiotic Product (ALIGN) 4 MG CAPS Take by mouth every morning. Reported on 08/02/2015     No facility-administered medications prior to visit.      ROS See HPI  Objective:  BP (!) 146/80   Pulse (!) 52   Temp 97.7 F (36.5 C)   Ht 5' (1.524 m)   Wt 104 lb (47.2 kg)   LMP 08/03/1993 (Approximate)   SpO2 96%   BMI 20.31 kg/m   BP Readings from Last 3 Encounters:  03/10/17 (!) 146/80  03/09/17 (!) 150/90  02/10/17 (!) 146/75    Wt Readings from Last 3 Encounters:  03/10/17 104 lb (47.2 kg)  03/09/17 104 lb (47.2 kg)  02/10/17 105 lb 8 oz (47.9 kg)    Physical Exam  Constitutional: No distress.  Neck: Normal range of motion. Neck supple. No JVD present.  Cardiovascular: Normal rate, regular rhythm and normal heart sounds. Exam reveals no gallop and no friction rub.  No murmur heard. Pulmonary/Chest: Breath sounds normal. No respiratory distress.  Musculoskeletal: She exhibits no edema.  Neurological: She is alert.  Oriented to person, place, current situation and husband in room.  Psychiatric: She has a normal mood and affect. Her behavior is normal.  Vitals reviewed.   Lab Results  Component Value Date   WBC 10.5 02/09/2017   HGB 13.6 02/09/2017   HCT 41.1 02/09/2017   PLT 297.0 02/09/2017   GLUCOSE 80 02/09/2017   CHOL 211 (H) 02/09/2017   TRIG 299.0 (H) 02/09/2017   HDL  43.10 02/09/2017   LDLDIRECT 129.0 02/09/2017   LDLCALC 129 (H) 01/02/2016   ALT 23 02/09/2017   AST 28 02/09/2017   NA 139 02/09/2017   K 4.5 02/09/2017   CL 103 02/09/2017   CREATININE 0.65 02/09/2017   BUN 21 02/09/2017   CO2 29 02/09/2017   TSH 2.31 02/09/2017    Assessment & Plan:   [name] was seen today for follow-up.  Diagnoses and all orders for this visit:  Abnormal heart sounds   I am having April Chung maintain her aspirin, fish oil-omega-3 fatty acids, Multiple Vitamins-Calcium (ONE-A-DAY WOMENS PO), ALIGN, latanoprost, donepezil, levETIRAcetam, memantine, GNP CALCIUM, amLODipine, losartan, citalopram, and medroxyPROGESTERone.  No orders of the defined types were placed in this encounter.   Follow-up:  No Follow-up on file.  April Pennaharlotte Paden Kuras, NP

## 2017-03-10 NOTE — Patient Instructions (Addendum)
Patient and husband declined echocardiogram today.  After collaboration with Dr.Amundson, I do not think echocardiogram is needed at this time. In the absence of any symptoms and normal exam today, no further evaluation is needed at this time.  Advised patient to return to office if develops any palpitations, SOB, chest pain, dizziness, diaphoresis, or syncope.

## 2017-03-11 ENCOUNTER — Telehealth: Payer: Self-pay | Admitting: Nurse Practitioner

## 2017-03-11 ENCOUNTER — Encounter: Payer: Self-pay | Admitting: Nurse Practitioner

## 2017-03-11 NOTE — Telephone Encounter (Signed)
Pt spouse is aware

## 2017-03-11 NOTE — Telephone Encounter (Signed)
-----   Message from Patton SallesBrook E Amundson C Silva, MD sent at 03/11/2017  4:14 AM EST ----- Good morning!  What I heard on exam was an occasional added beat, which appeared to be an S3.  I heard it several times.  It did not sound like atrial fibrillation.  Her rate was normal.   Would she benefit from an EKG also?  Thank you for seeing her.  April (Amundson) Edward JollySilva   ----- Message ----- From: April Chung, April Wigington Lum, NP Sent: 03/10/2017  11:33 AM To: April Rosalin HawkingE Amundson C Silva, MD  Hello Dr. Ricki MillerAmundson,  [name] is a mutual patient. She came to me today accompanied by her husband to evaluate heart murmur found during her visit with you. During her visit with me, I could not identify a mumur or any abnormal heart sound or rhythm. I offered to order an echocardiogram for further evaluation. They declined, stating they will like for me to clarify with you what your findings were since Mr. April Chung was not in room during the visit.  I will appreciate your collaboration. Thank you  Alysia Pennaharlotte Keyshawn Hellwig, AGNP-C

## 2017-03-12 LAB — CYTOLOGY - PAP: DIAGNOSIS: NEGATIVE

## 2017-03-18 DIAGNOSIS — Z1231 Encounter for screening mammogram for malignant neoplasm of breast: Secondary | ICD-10-CM | POA: Diagnosis not present

## 2017-03-18 NOTE — Progress Notes (Addendum)
Subjective:   April Chung is a 72 y.o. female who presents for Medicare Annual (Subsequent) preventive examination.  Review of Systems: No ROS.  Medicare Wellness Visit. Additional risk factors are reflected in the social history. Cardiac Risk Factors include: advanced age (>3men, >53 women);hypertension Sleep patterns: Sleeps 8-9 hrs. Feels rested.  Home Safety/Smoke Alarms: Feels safe in home. Smoke alarms in place.  Living environment; residence and Firearm Safety: Lives in 2 story home. No issues with stairs.  Seat Belt Safety/Bike Helmet: Wears seat belt.   Female:   Pap-  Last 03/09/17-normal     Mammo- ordered      Dexa scan- ordered [facility]- last 03/28/10 with 5 yr recall per Dr.Mann    Objective:     Vitals: BP 140/74 (BP Location: Left Arm, Patient Position: Sitting, Cuff Size: Normal)   Pulse (!) 51   Ht 5' (1.524 m)   Wt 106 lb 3.2 oz (48.2 kg)   LMP 08/03/1993 (Approximate)   SpO2 98%   BMI 20.74 kg/m   Body mass index is 20.74 kg/m. Wt Readings from Last 3 Encounters:  03/25/17 106 lb 3.2 oz (48.2 kg)  03/10/17 104 lb (47.2 kg)  03/09/17 104 lb (47.2 kg)   Temp Readings from Last 3 Encounters:  03/10/17 97.7 F (36.5 C)  02/09/17 (!) 97.5 F (36.4 C)  07/21/16 97.7 F (36.5 C)   BP Readings from Last 3 Encounters:  03/25/17 140/74  03/10/17 (!) 146/80  03/09/17 (!) 150/90   Pulse Readings from Last 3 Encounters:  03/25/17 (!) 51  03/10/17 (!) 52  03/09/17 (!) 52    Advanced Directives 03/25/2017 07/12/2016 08/02/2014  Does Patient Have a Medical Advance Directive? Yes No Yes  Type of Estate agent of [place];Living will - Healthcare Power of [place];Living will  Copy of Healthcare Power of Attorney in Chart? No - copy requested - No - copy requested  Would patient like information on creating a medical advance directive? - No - Patient declined -    Tobacco Social History   Tobacco Use  Smoking Status Former Smoker  .  Packs/day: 1.00  . Years: 15.00  . Pack years: 15.00  . Types: Cigarettes  . Last attempt to quit: 02/04/1976  . Years since quitting: 41.1  Smokeless Tobacco Never Used     Counseling given: Not Answered   Clinical Intake: Pain : No/denies pain    Past Medical History:  Diagnosis Date  . Abnormal EEG 01/14/12   started on Keppra  . Dermatomyositis (HCC)    remission on pred until 2008, rheum at [facility]  . GERD (gastroesophageal reflux disease)    agravated with Fosamax  . Glaucoma   . Hypertension 2013  . Memory loss   . PMB (postmenopausal bleeding) 08/2003  . Seizures (HCC)   . Status post dilation of esophageal narrowing   . Urinary incontinence, mixed 07/01/11   Fitted for 2.75 " Ring Pessary with support  . Vitamin D deficiency disease    Past Surgical History:  Procedure Laterality Date  . COLONOSCOPY  09/25/04   Dr. Loreta Ave  . COLONOSCOPY  02/2009  . ENDOMETRIAL BIOPSY  08/24/03   weak proliferative endo, [facility] neg.  Marland Kitchen ESOPHAGOGASTRODUODENOSCOPY ENDOSCOPY  04/29/04   anemia without cause  . NO PAST SURGERIES     Family History  Problem Relation Age of Onset  . Dementia Father        died age 89, otherwise healthy  .  Hypertension Mother   . Heart disease Maternal Grandfather   . Heart disease Maternal Grandmother   . Seizures Neg Hx    Social History   Socioeconomic History  . Marital status: Married    Spouse name: [name]  . Number of children: 1  . Years of education: hs  . Highest education level: None  Social Needs  . Financial resource strain: None  . Food insecurity - worry: None  . Food insecurity - inability: None  . Transportation needs - medical: None  . Transportation needs - non-medical: None  Occupational History  . Occupation: housewife  Tobacco Use  . Smoking status: Former Smoker    Packs/day: 1.00    Years: 15.00    Pack years: 15.00    Types: Cigarettes    Last attempt to quit: 02/04/1976    Years since quitting: 41.1  .  Smokeless tobacco: Never Used  Substance and Sexual Activity  . Alcohol use: Yes    Alcohol/week: 4.2 oz    Types: 7 Glasses of wine per week    Comment: 1 glass of wine per day  . Drug use: No  . Sexual activity: No    Partners: Male    Birth control/protection: Post-menopausal  Other Topics Concern  . None  Social History Narrative   Married, lives with spouse [name]).   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   Patient has one child.   Patient is right-handed.   Patient drinks 2-3 cups of coffee daily and maybe half cup at night.                Outpatient Encounter Medications as of 03/25/2017  Medication Sig  . amLODipine (NORVASC) 2.5 MG tablet TAKE 1 TABLET BY MOUTH EVERY DAY  . aspirin 81 MG tablet Take 81 mg by mouth daily.  . citalopram (CELEXA) 20 MG tablet TAKE 1 TABLET BY MOUTH EVERY DAY  . donepezil (ARICEPT) 10 MG tablet Take 10 mg by mouth at bedtime.  . fish oil-omega-3 fatty acids 1000 MG capsule Take 2 g by mouth 2 (two) times daily after a meal.  . GNP CALCIUM 600 MG TABS tablet TAKE 1 TABLET BY MOUTH 2 TIMES DAILY  . latanoprost (XALATAN) 0.005 % ophthalmic solution   . levETIRAcetam (KEPPRA) 250 MG tablet TAKE 1 TABLET BY MOUTH 2 TIMES DAILY  . losartan (COZAAR) 50 MG tablet Take 1 tablet (50 mg total) by mouth daily.  . medroxyPROGESTERone (PROVERA) 2.5 MG tablet Take 2.5 mg by mouth daily.  . memantine (NAMENDA) 10 MG tablet TAKE 1 TABLET BY MOUTH 2 TIMES DAILY  . Multiple Vitamins-Calcium (ONE-A-DAY WOMENS PO) Take by mouth daily.  . Probiotic Product (ALIGN) 4 MG CAPS Take by mouth every morning. Reported on 08/02/2015  . Cholecalciferol (VITAMIN D3) 400 units tablet Take 400 Units by mouth 2 (two) times daily.  . [DISCONTINUED] amLODipine (NORVASC) 2.5 MG tablet Take 1 tablet (2.5 mg total) by mouth daily.  . [DISCONTINUED] citalopram (CELEXA) 20 MG tablet Take 1 tablet (20 mg total) by  mouth daily.   No facility-administered encounter medications on file as of 03/25/2017.     Activities of Daily Living In your present state of health, do you have any difficulty performing the following activities: 03/25/2017  Hearing? N  Vision? N  Comment wearing contact. Dr.Oman yearly.  Difficulty concentrating or making decisions? N  Walking or climbing stairs? N  Dressing  or bathing? N  Doing errands, shopping? N  Preparing Food and eating ? N  Using the Toilet? N  In the past six months, have you accidently leaked urine? N  Do you have problems with loss of bowel control? N  Managing your Medications? N  Managing your Finances? N  Housekeeping or managing your Housekeeping? N  Some recent data might be hidden    Patient Care Team: Nche, Bonna Gains, NP as PCP - General (Internal Medicine) Romine, Edwena Felty, MD (Obstetrics and Gynecology) [place], Heather, [place] (Optometry) Hilarie Fredrickson, MD (Gastroenterology) York Spaniel, MD (Neurology)    Assessment:   This is a routine wellness examination for Thresa. Physical assessment deferred to PCP.  Exercise Activities and Dietary recommendations Current Exercise Habits: Home exercise routine, Type of exercise: walking, Time (Minutes): 30, Frequency (Times/Week): 5, Weekly Exercise (Minutes/Week): 150, Intensity: Mild   Diet (meal preparation, eat out, water intake, caffeinated beverages, dairy products, fruits and vegetables): in general, a "healthy" diet  , well balanced Breakfast: cereal or oatmeal. Decaf coffee Lunch: sandwich and chips. Water.  Dinner: vegetables and cornbread. Sweet tea. Snack: ice cream  Goals    . Maintain current active lifestyle.       Fall Risk Fall Risk  03/25/2017 02/09/2017 05/07/2016 02/05/2016 12/13/2013  Falls in the past year? No No No No No   Depression Screen PHQ 2/9 Scores 03/25/2017 02/09/2017 02/05/2016 12/13/2013  PHQ - 2 Score 0 0 0 1  PHQ- 9 Score - 0 - -     Cognitive  Function MMSE - Mini Mental State Exam 03/25/2017 02/10/2017 07/28/2016 02/07/2016 08/02/2015  Orientation to time 5 5 3 1 2   Orientation to Place 5 5 5 4 3   Registration 3 3 3 3 3   Attention/ Calculation 5 5 5 2 4   Recall 0 3 3 2 2   Language- name 2 objects 2 2 2 2 2   Language- repeat 1 1 1 1 1   Language- follow 3 step command 3 3 3 2 3   Language- read & follow direction 1 1 1 1 1   Write a sentence 1 1 1 1 1   Copy design 1 1 1  0 1  Total score 27 30 28 19 23         Immunization History  Administered Date(s) Administered  . Influenza Split 11/04/2010  . Influenza, High Dose Seasonal PF 11/08/2016  . Influenza-Unspecified 11/17/2013  . Pneumococcal Conjugate-13 12/13/2013  . Pneumococcal-Unspecified 11/12/2011  . Tdap 08/24/2012    Screening Tests Health Maintenance  Topic Date Due  . MAMMOGRAM  01/29/2018  . COLONOSCOPY  03/28/2020  . TETANUS/TDAP  08/25/2022  . INFLUENZA VACCINE  Completed  . DEXA SCAN  Completed  . Hepatitis C Screening  Completed  . PNA vac Low Risk Adult  Completed       Plan:   Follow up with PCP as directed  Continue to eat heart healthy diet (full of fruits, vegetables, whole grains, lean protein, water--limit salt, fat, and sugar intake) and increase physical activity as tolerated.  Continue doing brain stimulating activities (puzzles, reading, adult coloring books, staying active) to keep memory sharp.     I have personally reviewed and noted the following in the patient's chart:   . Medical and social history . Use of alcohol, tobacco or illicit drugs  . Current medications and supplements . Functional ability and status . Nutritional status . Physical activity . Advanced directives . List of other physicians . Hospitalizations, surgeries,  and ER visits in previous 12 months . Vitals . Screenings to include cognitive, depression, and falls . Referrals and appointments  In addition, I have reviewed and discussed with patient certain  preventive protocols, quality metrics, and best practice recommendations. A written personalized care plan for preventive services as well as general preventive health recommendations were provided to patient.     Avon GullyBritt, Katalena Malveaux Angel, [place]RN  03/25/2017

## 2017-03-23 ENCOUNTER — Other Ambulatory Visit: Payer: Self-pay | Admitting: Nurse Practitioner

## 2017-03-23 DIAGNOSIS — I10 Essential (primary) hypertension: Secondary | ICD-10-CM

## 2017-03-23 DIAGNOSIS — F339 Major depressive disorder, recurrent, unspecified: Secondary | ICD-10-CM

## 2017-03-25 ENCOUNTER — Ambulatory Visit (INDEPENDENT_AMBULATORY_CARE_PROVIDER_SITE_OTHER): Payer: Medicare Other | Admitting: Behavioral Health

## 2017-03-25 ENCOUNTER — Encounter: Payer: Self-pay | Admitting: Behavioral Health

## 2017-03-25 VITALS — BP 140/74 | HR 51 | Ht 60.0 in | Wt 106.2 lb

## 2017-03-25 DIAGNOSIS — Z0000 Encounter for general adult medical examination without abnormal findings: Secondary | ICD-10-CM | POA: Diagnosis not present

## 2017-03-25 DIAGNOSIS — Z1211 Encounter for screening for malignant neoplasm of colon: Secondary | ICD-10-CM

## 2017-03-25 NOTE — Progress Notes (Signed)
Medical screening examination/treatment/procedure(s) were performed by the Wellness Coach, RN. As primary care provider I was immediately available for consulation/collaboration. I agree with above documentation. Naylin Burkle, AGNP-C 

## 2017-03-25 NOTE — Patient Instructions (Signed)
Continue to eat heart healthy diet (full of fruits, vegetables, whole grains, lean protein, water--limit salt, fat, and sugar intake) and increase physical activity as tolerated.  Continue doing brain stimulating activities (puzzles, reading, adult coloring books, staying active) to keep memory sharp.    [name] , Thank you for taking time to come for your Medicare Wellness Visit. I appreciate your ongoing commitment to your health goals. Please review the following plan we discussed and let me know if I can assist you in the future.   These are the goals we discussed: Goals    . Maintain current active lifestyle.       This is a list of the screening recommended for you and due dates:  Health Maintenance  Topic Date Due  . Mammogram  01/29/2018  . Colon Cancer Screening  03/28/2020  . Tetanus Vaccine  08/25/2022  . Flu Shot  Completed  . DEXA scan (bone density measurement)  Completed  .  Hepatitis C: One time screening is recommended by Center for Disease Control  (CDC) for  adults born from [birth year] through 1967.   Completed  . Pneumonia vaccines  Completed    Health Maintenance for Postmenopausal Women Menopause is a normal process in which your reproductive ability comes to an end. This process happens gradually over a span of months to years, usually between the ages of 72 and 56. Menopause is complete when you have missed 12 consecutive menstrual periods. It is important to talk with your health care provider about some of the most common conditions that affect postmenopausal women, such as heart disease, cancer, and bone loss (osteoporosis). Adopting a healthy lifestyle and getting preventive care can help to promote your health and wellness. Those actions can also lower your chances of developing some of these common conditions. What should I know about menopause? During menopause, you may experience a number of symptoms, such as:  Moderate-to-severe hot flashes.  Night  sweats.  Decrease in sex drive.  Mood swings.  Headaches.  Tiredness.  Irritability.  Memory problems.  Insomnia.  Choosing to treat or not to treat menopausal changes is an individual decision that you make with your health care provider. What should I know about hormone replacement therapy and supplements? Hormone therapy products are effective for treating symptoms that are associated with menopause, such as hot flashes and night sweats. Hormone replacement carries certain risks, especially as you become older. If you are thinking about using estrogen or estrogen with progestin treatments, discuss the benefits and risks with your health care provider. What should I know about heart disease and stroke? Heart disease, heart attack, and stroke become more likely as you age. This may be due, in part, to the hormonal changes that your body experiences during menopause. These can affect how your body processes dietary fats, triglycerides, and cholesterol. Heart attack and stroke are both medical emergencies. There are many things that you can do to help prevent heart disease and stroke:  Have your blood pressure checked at least every 1-2 years. High blood pressure causes heart disease and increases the risk of stroke.  If you are 62-63 years old, ask your health care provider if you should take aspirin to prevent a heart attack or a stroke.  Do not use any tobacco products, including cigarettes, chewing tobacco, or electronic cigarettes. If you need help quitting, ask your health care provider.  It is important to eat a healthy diet and maintain a healthy weight. ? Be sure  to include plenty of vegetables, fruits, low-fat dairy products, and lean protein. ? Avoid eating foods that are high in solid fats, added sugars, or salt (sodium).  Get regular exercise. This is one of the most important things that you can do for your health. ? Try to exercise for at least 150 minutes each week.  The type of exercise that you do should increase your heart rate and make you sweat. This is known as moderate-intensity exercise. ? Try to do strengthening exercises at least twice each week. Do these in addition to the moderate-intensity exercise.  Know your numbers.Ask your health care provider to check your cholesterol and your blood glucose. Continue to have your blood tested as directed by your health care provider.  What should I know about cancer screening? There are several types of cancer. Take the following steps to reduce your risk and to catch any cancer development as early as possible. Breast Cancer  Practice breast self-awareness. ? This means understanding how your breasts normally appear and feel. ? It also means doing regular breast self-exams. Let your health care provider know about any changes, no matter how small.  If you are 31 or older, have a clinician do a breast exam (clinical breast exam or CBE) every year. Depending on your age, family history, and medical history, it may be recommended that you also have a yearly breast X-ray (mammogram).  If you have a family history of breast cancer, talk with your health care provider about genetic screening.  If you are at high risk for breast cancer, talk with your health care provider about having an MRI and a mammogram every year.  Breast cancer (BRCA) gene test is recommended for women who have family members with BRCA-related cancers. Results of the assessment will determine the need for genetic counseling and BRCA1 and for BRCA2 testing. BRCA-related cancers include these types: ? Breast. This occurs in males or females. ? Ovarian. ? Tubal. This may also be called fallopian tube cancer. ? Cancer of the abdominal or pelvic lining (peritoneal cancer). ? Prostate. ? Pancreatic.  Cervical, Uterine, and Ovarian Cancer Your health care provider may recommend that you be screened regularly for cancer of the pelvic  organs. These include your ovaries, uterus, and vagina. This screening involves a pelvic exam, which includes checking for microscopic changes to the surface of your cervix (Pap test).  For women ages 21-65, health care providers may recommend a pelvic exam and a Pap test every three years. For women ages 67-65, they may recommend the Pap test and pelvic exam, combined with testing for human papilloma virus (HPV), every five years. Some types of HPV increase your risk of cervical cancer. Testing for HPV may also be done on women of any age who have unclear Pap test results.  Other health care providers may not recommend any screening for nonpregnant women who are considered low risk for pelvic cancer and have no symptoms. Ask your health care provider if a screening pelvic exam is right for you.  If you have had past treatment for cervical cancer or a condition that could lead to cancer, you need Pap tests and screening for cancer for at least 20 years after your treatment. If Pap tests have been discontinued for you, your risk factors (such as having a new sexual partner) need to be reassessed to determine if you should start having screenings again. Some women have medical problems that increase the chance of getting cervical cancer. In these  cases, your health care provider may recommend that you have screening and Pap tests more often.  If you have a family history of uterine cancer or ovarian cancer, talk with your health care provider about genetic screening.  If you have vaginal bleeding after reaching menopause, tell your health care provider.  There are currently no reliable tests available to screen for ovarian cancer.  Lung Cancer Lung cancer screening is recommended for adults 46-30 years old who are at high risk for lung cancer because of a history of smoking. A yearly low-dose CT scan of the lungs is recommended if you:  Currently smoke.  Have a history of at least 30 pack-years of  smoking and you currently smoke or have quit within the past 15 years. A pack-year is smoking an average of one pack of cigarettes per day for one year.  Yearly screening should:  Continue until it has been 15 years since you quit.  Stop if you develop a health problem that would prevent you from having lung cancer treatment.  Colorectal Cancer  This type of cancer can be detected and can often be prevented.  Routine colorectal cancer screening usually begins at age 46 and continues through age 24.  If you have risk factors for colon cancer, your health care provider may recommend that you be screened at an earlier age.  If you have a family history of colorectal cancer, talk with your health care provider about genetic screening.  Your health care provider may also recommend using home test kits to check for hidden blood in your stool.  A small camera at the end of a tube can be used to examine your colon directly (sigmoidoscopy or colonoscopy). This is done to check for the earliest forms of colorectal cancer.  Direct examination of the colon should be repeated every 5-10 years until age 68. However, if early forms of precancerous polyps or small growths are found or if you have a family history or genetic risk for colorectal cancer, you may need to be screened more often.  Skin Cancer  Check your skin from head to toe regularly.  Monitor any moles. Be sure to tell your health care provider: ? About any new moles or changes in moles, especially if there is a change in a mole's shape or color. ? If you have a mole that is larger than the size of a pencil eraser.  If any of your family members has a history of skin cancer, especially at a young age, talk with your health care provider about genetic screening.  Always use sunscreen. Apply sunscreen liberally and repeatedly throughout the day.  Whenever you are outside, protect yourself by wearing long sleeves, pants, a wide-brimmed  hat, and sunglasses.  What should I know about osteoporosis? Osteoporosis is a condition in which bone destruction happens more quickly than new bone creation. After menopause, you may be at an increased risk for osteoporosis. To help prevent osteoporosis or the bone fractures that can happen because of osteoporosis, the following is recommended:  If you are 58-12 years old, get at least 1,000 mg of calcium and at least 600 mg of vitamin D per day.  If you are older than age 18 but younger than age 57, get at least 1,200 mg of calcium and at least 600 mg of vitamin D per day.  If you are older than age 22, get at least 1,200 mg of calcium and at least 800 mg of vitamin D per  day.  Smoking and excessive alcohol intake increase the risk of osteoporosis. Eat foods that are rich in calcium and vitamin D, and do weight-bearing exercises several times each week as directed by your health care provider. What should I know about how menopause affects my mental health? Depression may occur at any age, but it is more common as you become older. Common symptoms of depression include:  Low or sad mood.  Changes in sleep patterns.  Changes in appetite or eating patterns.  Feeling an overall lack of motivation or enjoyment of activities that you previously enjoyed.  Frequent crying spells.  Talk with your health care provider if you think that you are experiencing depression. What should I know about immunizations? It is important that you get and maintain your immunizations. These include:  Tetanus, diphtheria, and pertussis (Tdap) booster vaccine.  Influenza every year before the flu season begins.  Pneumonia vaccine.  Shingles vaccine.  Your health care provider may also recommend other immunizations. This information is not intended to replace advice given to you by your health care provider. Make sure you discuss any questions you have with your health care provider. Document Released:  03/14/2005 Document Revised: 08/10/2015 Document Reviewed: 10/24/2014 Elsevier Interactive Patient Education  2018 Elsevier Inc.  

## 2017-03-25 NOTE — Addendum Note (Signed)
Addended by: Mady HaagensenBRITT, Jhanvi Drakeford A on: 03/25/2017 09:42 AM   Modules accepted: Orders

## 2017-04-06 ENCOUNTER — Telehealth: Payer: Self-pay | Admitting: Internal Medicine

## 2017-04-06 NOTE — Telephone Encounter (Signed)
Spoke with pts husband and he is aware or colon recall date. Recall entered in epic.

## 2017-04-06 NOTE — Telephone Encounter (Signed)
Please advise regarding colon recall. Colon report from 2012 with Dr. Loreta AveMann states 5 year recall but path report from that procedure was unremarkable. No recall in system. Please advise.

## 2017-04-06 NOTE — Telephone Encounter (Signed)
2022 per my office note 10-2011. Thanks

## 2017-05-11 DIAGNOSIS — H401132 Primary open-angle glaucoma, bilateral, moderate stage: Secondary | ICD-10-CM | POA: Diagnosis not present

## 2017-05-14 ENCOUNTER — Telehealth: Payer: Self-pay | Admitting: Obstetrics and Gynecology

## 2017-05-14 NOTE — Telephone Encounter (Signed)
VOID

## 2017-07-15 ENCOUNTER — Other Ambulatory Visit: Payer: Self-pay | Admitting: Neurology

## 2017-07-15 ENCOUNTER — Other Ambulatory Visit: Payer: Self-pay | Admitting: Nurse Practitioner

## 2017-07-15 DIAGNOSIS — E559 Vitamin D deficiency, unspecified: Secondary | ICD-10-CM

## 2017-08-10 DIAGNOSIS — H16143 Punctate keratitis, bilateral: Secondary | ICD-10-CM | POA: Diagnosis not present

## 2017-08-10 DIAGNOSIS — H18453 Nodular corneal degeneration, bilateral: Secondary | ICD-10-CM | POA: Diagnosis not present

## 2017-08-10 DIAGNOSIS — H401133 Primary open-angle glaucoma, bilateral, severe stage: Secondary | ICD-10-CM | POA: Diagnosis not present

## 2017-08-10 DIAGNOSIS — H04123 Dry eye syndrome of bilateral lacrimal glands: Secondary | ICD-10-CM | POA: Diagnosis not present

## 2017-09-03 ENCOUNTER — Encounter: Payer: Self-pay | Admitting: Nurse Practitioner

## 2017-09-03 ENCOUNTER — Ambulatory Visit: Payer: Medicare Other | Admitting: Nurse Practitioner

## 2017-09-03 VITALS — BP 140/72 | HR 40 | Temp 97.7°F | Ht 60.0 in | Wt 110.4 lb

## 2017-09-03 DIAGNOSIS — I10 Essential (primary) hypertension: Secondary | ICD-10-CM | POA: Diagnosis not present

## 2017-09-03 DIAGNOSIS — L729 Follicular cyst of the skin and subcutaneous tissue, unspecified: Secondary | ICD-10-CM | POA: Diagnosis not present

## 2017-09-03 DIAGNOSIS — R001 Bradycardia, unspecified: Secondary | ICD-10-CM

## 2017-09-03 DIAGNOSIS — F339 Major depressive disorder, recurrent, unspecified: Secondary | ICD-10-CM | POA: Diagnosis not present

## 2017-09-03 DIAGNOSIS — L089 Local infection of the skin and subcutaneous tissue, unspecified: Secondary | ICD-10-CM

## 2017-09-03 MED ORDER — CITALOPRAM HYDROBROMIDE 20 MG PO TABS: 20.0000 mg | ORAL_TABLET | Freq: Every day | ORAL | 3 refills | Status: DC

## 2017-09-03 MED ORDER — AMLODIPINE BESYLATE 2.5 MG PO TABS: 2.5000 mg | ORAL_TABLET | Freq: Every day | ORAL | 3 refills | Status: DC

## 2017-09-03 MED ORDER — LOSARTAN POTASSIUM 50 MG PO TABS: 50.0000 mg | ORAL_TABLET | Freq: Every day | ORAL | 3 refills | Status: DC

## 2017-09-03 MED ORDER — CEPHALEXIN 500 MG PO CAPS: 500.0000 mg | ORAL_CAPSULE | Freq: Three times a day (TID) | ORAL | 0 refills | Status: DC

## 2017-09-03 NOTE — Progress Notes (Signed)
Subjective:  Patient ID: April Chung, female    DOB: July 05, 1945  Age: 72 y.o. MRN: [id number]  CC: Abscess (left arm abscess/knot, no pain, red , draining. this has been going on for 3 weeks. )  Rash  This is a new problem. The current episode started 1 to 4 weeks ago. The problem has been gradually worsening since onset. The affected locations include the left arm. The rash is characterized by redness and draining. It is unknown if there was an exposure to a precipitant. Pertinent negatives include no fatigue, joint pain or shortness of breath. Past treatments include nothing. There is no history of allergies or eczema.   accompanied by husband.  During office visit visit, I noticed marked bradycardia. She denies any palpitations or chest pain or syncope/pre syncopal symptoms, or fatigue, or SOB. She denies any change in energy level, but husband report she seems more tired and moves slow.  HTN: Controlled with amlodipine and losartan BP Readings from Last 3 Encounters:  09/03/17 140/72  03/25/17 140/74  03/10/17 (!) 146/80   Reviewed past Medical, Social and Family history today.  Outpatient Medications Prior to Visit  Medication Sig Dispense Refill  . aspirin 81 MG tablet Take 81 mg by mouth daily.    . calcium carbonate (OSCAL) 1500 (600 Ca) MG TABS tablet Take 1 tablet by mouth 2 (two) times daily.  5  . Cholecalciferol (VITAMIN D3) 400 units tablet Take 400 Units by mouth 2 (two) times daily.  3  . COMBIGAN 0.2-0.5 % ophthalmic solution Instill 1 drop into both eyes twice a day  4  . donepezil (ARICEPT) 10 MG tablet Take 10 mg by mouth at bedtime.  11  . dorzolamidel-timolol (COSOPT) 22.3-6.8 MG/ML SOLN ophthalmic solution PLACE 1 DROP IN EACH EYE 2 TIMES DAILY  3  . fish oil-omega-3 fatty acids 1000 MG capsule Take 2 g by mouth 2 (two) times daily after a meal.    . GNP CALCIUM 600 MG TABS tablet TAKE 1 TABLET BY MOUTH 2 TIMES DAILY 60 tablet 5  . levETIRAcetam (KEPPRA) 250  MG tablet TAKE 1 TABLET BY MOUTH 2 TIMES DAILY 60 tablet 11  . memantine (NAMENDA) 10 MG tablet TAKE 1 TABLET BY MOUTH 2 TIMES DAILY 60 tablet 5  . Multiple Vitamins-Calcium (ONE-A-DAY WOMENS PO) Take by mouth daily.    . Probiotic Product (ALIGN) 4 MG CAPS Take by mouth every morning. Reported on 08/02/2015    . ZIOPTAN 0.0015 % SOLN Administer 1 drop into both eyes at bedtime as directed  3  . amLODipine (NORVASC) 2.5 MG tablet TAKE 1 TABLET BY MOUTH EVERY DAY 90 tablet 1  . citalopram (CELEXA) 20 MG tablet TAKE 1 TABLET BY MOUTH EVERY DAY 90 tablet 1  . losartan (COZAAR) 50 MG tablet Take 1 tablet (50 mg total) by mouth daily. 90 tablet 3  . latanoprost (XALATAN) 0.005 % ophthalmic solution   1  . medroxyPROGESTERone (PROVERA) 2.5 MG tablet Take 2.5 mg by mouth daily.     No facility-administered medications prior to visit.    ROS Review of Systems  Constitutional: Negative.  Negative for fatigue.  Respiratory: Negative.  Negative for shortness of breath.   Cardiovascular: Negative.   Musculoskeletal: Negative for joint pain.  Psychiatric/Behavioral: Negative.    Objective:  BP 140/72   Pulse (!) 40   Temp 97.7 F (36.5 C)   Ht 5' (1.524 m)   Wt 110 lb 6.4 oz (50.1 kg)  LMP 08/03/1993 (Approximate)   SpO2 95%   BMI 21.56 kg/m   BP Readings from Last 3 Encounters:  09/03/17 140/72  03/25/17 140/74  03/10/17 (!) 146/80   Wt Readings from Last 3 Encounters:  09/03/17 110 lb 6.4 oz (50.1 kg)  03/25/17 106 lb 3.2 oz (48.2 kg)  03/10/17 104 lb (47.2 kg)   Physical Exam  Constitutional: She is oriented to person, place, and time. No distress.  Cardiovascular: Normal rate, regular rhythm, normal heart sounds and intact distal pulses. Exam reveals no gallop.  No murmur heard. Pulses:      Carotid pulses are 0 on the right side, and 0 on the left side. Pulmonary/Chest: Effort normal and breath sounds normal.  Musculoskeletal:       Right elbow: Normal.      Right wrist:  Normal.       Right forearm: She exhibits no tenderness, no bony tenderness, no edema and no deformity.  Neurological: She is alert and oriented to person, place, and time.  Skin: Rash noted. She is not diaphoretic. There is erythema.     Psychiatric: She has a normal mood and affect. Her behavior is normal. Thought content normal.  Vitals reviewed.   Lab Results  Component Value Date   WBC 10.5 02/09/2017   HGB 13.6 02/09/2017   HCT 41.1 02/09/2017   PLT 297.0 02/09/2017   GLUCOSE 80 02/09/2017   CHOL 211 (H) 02/09/2017   TRIG 299.0 (H) 02/09/2017   HDL 43.10 02/09/2017   LDLDIRECT 129.0 02/09/2017   LDLCALC 129 (H) 01/02/2016   ALT 23 02/09/2017   AST 28 02/09/2017   NA 139 02/09/2017   K 4.5 02/09/2017   CL 103 02/09/2017   CREATININE 0.65 02/09/2017   BUN 21 02/09/2017   CO2 29 02/09/2017   TSH 2.31 02/09/2017    Assessment & Plan:   April Chung was seen today for abscess.  Diagnoses and all orders for this visit:  Essential hypertension -     amLODipine (NORVASC) 2.5 MG tablet; Take 1 tablet (2.5 mg total) by mouth daily. -     losartan (COZAAR) 50 MG tablet; Take 1 tablet (50 mg total) by mouth daily.  Infected cyst of skin -     Ambulatory referral to Dermatology -     cephALEXin (KEFLEX) 500 MG capsule; Take 1 capsule (500 mg total) by mouth 3 (three) times daily. With food  Bradycardia -     EKG 12-Lead -     Ambulatory referral to Cardiology  Recurrent major depressive disorder, remission status unspecified (HCC) -     citalopram (CELEXA) 20 MG tablet; Take 1 tablet (20 mg total) by mouth daily.   I have discontinued April Chung's latanoprost and medroxyPROGESTERone. I have also changed her amLODipine and citalopram. Additionally, I am having her start on cephALEXin. Lastly, I am having her maintain her aspirin, fish oil-omega-3 fatty acids, Multiple Vitamins-Calcium (ONE-A-DAY WOMENS PO), ALIGN, donepezil, levETIRAcetam, Vitamin D3, memantine, GNP CALCIUM,  dorzolamidel-timolol, COMBIGAN, ZIOPTAN, calcium carbonate, and losartan.  Meds ordered this encounter  Medications  . cephALEXin (KEFLEX) 500 MG capsule    Sig: Take 1 capsule (500 mg total) by mouth 3 (three) times daily. With food    Dispense:  15 capsule    Refill:  0    Order Specific Question:   Supervising Provider    Answer:   Dianne DunARON, TALIA M [[id number]]  . amLODipine (NORVASC) 2.5 MG tablet    Sig: Take 1  tablet (2.5 mg total) by mouth daily.    Dispense:  90 tablet    Refill:  3    Order Specific Question:   Supervising Provider    Answer:   Dianne Dun [[id number]]  . losartan (COZAAR) 50 MG tablet    Sig: Take 1 tablet (50 mg total) by mouth daily.    Dispense:  90 tablet    Refill:  3    Order Specific Question:   Supervising Provider    Answer:   Dianne Dun [[id number]]  . citalopram (CELEXA) 20 MG tablet    Sig: Take 1 tablet (20 mg total) by mouth daily.    Dispense:  90 tablet    Refill:  3    Order Specific Question:   Supervising Provider    Answer:   Dianne Dun [[id number]]   Follow-up: Return in about 6 months (around 03/06/2018) for HTN and , hyperlipidemia.  Alysia Penna, NP

## 2017-09-03 NOTE — Patient Instructions (Addendum)
You will be contacted to schedule appt with dermatology. Let me know if there if no improvement in redness and drainage in 1week.  Change dressing at least once a day. Clean area with water and soap. Apply triple topical antibiotics with dressing change. Use topical antibiotic x3days, then stop.  You will be called to appt with cardiology.  Cellulitis, Adult Cellulitis is a skin infection. The infected area is usually red and sore. This condition occurs most often in the arms and lower legs. It is very important to get treated for this condition. Follow these instructions at home:  Take over-the-counter and prescription medicines only as told by your doctor.  If you were prescribed an antibiotic medicine, take it as told by your doctor. Do not stop taking the antibiotic even if you start to feel better.  Drink enough fluid to keep your pee (urine) clear or pale yellow.  Do not touch or rub the infected area.  Raise (elevate) the infected area above the level of your heart while you are sitting or lying down.  Place warm or cold wet cloths (warm or cold compresses) on the infected area. Do this as told by your doctor.  Keep all follow-up visits as told by your doctor. This is important. These visits let your doctor make sure your infection is not getting worse. Contact a doctor if:  You have a fever.  Your symptoms do not get better after 1-2 days of treatment.  Your bone or joint under the infected area starts to hurt after the skin has healed.  Your infection comes back. This can happen in the same area or another area.  You have a swollen bump in the infected area.  You have new symptoms.  You feel ill and also have muscle aches and pains. Get help right away if:  Your symptoms get worse.  You feel very sleepy.  You throw up (vomit) or have watery poop (diarrhea) for a long time.  There are red streaks coming from the infected area.  Your red area gets  larger.  Your red area turns darker. This information is not intended to replace advice given to you by your health care provider. Make sure you discuss any questions you have with your health care provider. Document Released: 07/09/2007 Document Revised: 06/28/2015 Document Reviewed: 11/29/2014 Elsevier Interactive Patient Education  2018 [employer]   Bradycardia, Adult Bradycardia is a slower-than-normal heartbeat. A normal resting heart rate for an adult ranges from 60 to 100 beats per minute. With bradycardia, the resting heart rate is less than 60 beats per minute. Bradycardia can prevent enough oxygen from reaching certain areas of your body when you are active. It can be serious if it keeps enough oxygen from reaching your brain and other parts of your body. Bradycardia is not a problem for everyone. For some healthy adults, a slow resting heart rate is normal. What are the causes? This condition may be caused by:  A problem with the heart, including: ? A problem with the heart's electrical system, such as a heart block. ? A problem with the heart's natural pacemaker (sinus node). ? Heart disease. ? A heart attack. ? Heart damage. ? A heart infection. ? A heart condition that is present at birth (congenital heart defect).  Certain medicines that treat heart conditions.  Certain conditions, such as hypothyroidism and obstructive sleep apnea.  Problems with the balance of chemicals and other substances, like potassium, in the blood.  What increases the  risk? This condition is more likely to develop in adults who:  Are age 67 or older.  Have high blood pressure (hypertension), high cholesterol (hyperlipidemia), or diabetes.  Drink heavily, use tobacco or nicotine products, or use drugs.  Are stressed.  What are the signs or symptoms? Symptoms of this condition include:  Light-headedness.  Feeling faint or fainting.  Fatigue and weakness.  Shortness of  breath.  Chest pain (angina).  Drowsiness.  Confusion.  Dizziness.  How is this diagnosed? This condition may be diagnosed based on:  Your symptoms.  Your medical history.  A physical exam.  During the exam, your health care provider will listen to your heartbeat and check your pulse. To confirm the diagnosis, your health care provider may order tests, such as:  Blood tests.  An electrocardiogram (ECG). This test records the heart's electrical activity. The test can show how fast your heart is beating and whether the heartbeat is steady.  A test in which you wear a portable device (event recorder or Holter monitor) to record your heart's electrical activity while you go about your day.  Anexercise test.  How is this treated? Treatment for this condition depends on the cause of the condition and how severe your symptoms are. Treatment may involve:  Treatment of the underlying condition.  Changing your medicines or how much medicine you take.  Having a small, battery-operated device called a pacemaker implanted under the skin. When bradycardia occurs, this device can be used to increase your heart rate and help your heart to beat in a regular rhythm.  Follow these instructions at home: Lifestyle   Manage any health conditions that contribute to bradycardia as told by your health care provider.  Follow a heart-healthy diet. A nutrition specialist (dietitian) can help to educate you about healthy food options and changes.  Follow an exercise program that is approved by your health care provider.  Maintain a healthy weight.  Try to reduce or manage your stress, such as with yoga or meditation. If you need help reducing stress, ask your health care provider.  Do not use use any products that contain nicotine or tobacco, such as cigarettes and e-cigarettes. If you need help quitting, ask your health care provider.  Do not use illegal drugs.  Limit alcohol intake to  no more than 1 drink per day for nonpregnant women and 2 drinks per day for men. One drink equals 12 oz of beer, 5 oz of wine, or 1 oz of hard liquor. General instructions  Take over-the-counter and prescription medicines only as told by your health care provider.  Keep all follow-up visits as directed by your health care provider. This is important. How is this prevented? In some cases, bradycardia may be prevented by:  Treating underlying medical problems.  Stopping behaviors or medicines that can trigger the condition.  Contact a health care provider if:  You feel light-headed or dizzy.  You almost faint.  You feel weak or are easily fatigued during physical activity.  You experience confusion or have memory problems. Get help right away if:  You faint.  You have an irregular heartbeat (palpitations).  You have chest pain.  You have trouble breathing. This information is not intended to replace advice given to you by your health care provider. Make sure you discuss any questions you have with your health care provider. Document Released: 10/12/2001 Document Revised: 09/18/2015 Document Reviewed: 07/12/2015 Elsevier Interactive Patient Education  2017 [employer].

## 2017-09-04 ENCOUNTER — Encounter: Payer: Self-pay | Admitting: Nurse Practitioner

## 2017-09-08 ENCOUNTER — Other Ambulatory Visit: Payer: Self-pay

## 2017-09-08 ENCOUNTER — Encounter: Payer: Self-pay | Admitting: Cardiovascular Disease

## 2017-09-08 ENCOUNTER — Ambulatory Visit (HOSPITAL_COMMUNITY): Payer: Medicare Other | Attending: Cardiovascular Disease

## 2017-09-08 ENCOUNTER — Ambulatory Visit: Payer: Medicare Other | Admitting: Cardiovascular Disease

## 2017-09-08 VITALS — BP 162/80 | HR 52 | Ht 60.0 in | Wt 111.0 lb

## 2017-09-08 DIAGNOSIS — I10 Essential (primary) hypertension: Secondary | ICD-10-CM | POA: Insufficient documentation

## 2017-09-08 DIAGNOSIS — R001 Bradycardia, unspecified: Secondary | ICD-10-CM | POA: Diagnosis not present

## 2017-09-08 LAB — ECHOCARDIOGRAM COMPLETE
Height: 60 in
Weight: 1776 oz

## 2017-09-08 NOTE — Patient Instructions (Addendum)
Medication Instructions:  Your physician has recommended you make the following change in your medication: STOP Aricept (Donepezil)  Labwork: Your physician recommends that you have lab work today TSH and BMET   Testing/Procedures: Your physician has requested that you have an echocardiogram. Echocardiography is a painless test that uses sound waves to create images of your heart. It provides your doctor with information about the size and shape of your heart and how well your heart's chambers and valves are working. This procedure takes approximately one hour. There are no restrictions for this procedure.  Your physician has requested that you have an exercise tolerance test- walk on treadmill to see if HR goes above 100. For further information please visit [email or web address]. Please also follow instruction sheet, as given.  Follow-Up: Your physician wants you to follow-up in: 3 months with Dr. Eden EmmsNishan.   If you need a refill on your cardiac medications before your next appointment, please call your pharmacy.

## 2017-09-08 NOTE — Progress Notes (Signed)
Cardiology Office Note   Date:  09/08/2017   ID:  April Chung, April Chung 01/30/1946, MRN [id number]  PCP:  Anne Ng, NP  Cardiologist:   Charlton Haws, MD   No chief complaint on file.     History of Present Illness: April Chung is a 72 y.o. female who presents for consultation regarding bradycardia. Referred by Alysia Penna NP.  Routine office visit 09/03/17 for rash noted pulse 40 patient denied cardiac symptoms no syncope, palpitations chest pain or dyspnea. HTN being Rx with norvasc and losartan no beta blocker Started on Keflex for infected skin cyst. Also history of depression on celexa ECG showed SB rate 42 with LAE No AV block HR was 72 on ECG done 07/14/16  Labs not done Cr/K were normal 02/09/17 .65/4.5 TSH 2.3 in January as well. She is on Namenda and Aricept  The aricept appears to have been started 04/28/16   She has not had any presyncope Husband of 40 years thinks she's slowed down Aricept Has not helped with memory anyway.    Past Medical History:  Diagnosis Date  . Abnormal EEG 01/14/12   started on Keppra  . Dermatomyositis (HCC)    remission on pred until 2008, rheum at [facility]  . GERD (gastroesophageal reflux disease)    agravated with Fosamax  . Glaucoma   . Hypertension 2013  . Memory loss   . PMB (postmenopausal bleeding) 08/2003  . Seizures (HCC)   . Status post dilation of esophageal narrowing   . Urinary incontinence, mixed 07/01/11   Fitted for 2.75 " Ring Pessary with support  . Vitamin D deficiency disease     Past Surgical History:  Procedure Laterality Date  . COLONOSCOPY  09/25/04   Dr. Loreta Ave  . COLONOSCOPY  02/2009  . ENDOMETRIAL BIOPSY  08/24/03   weak proliferative endo, [facility] neg.  Marland Kitchen ESOPHAGOGASTRODUODENOSCOPY ENDOSCOPY  04/29/04   anemia without cause  . NO PAST SURGERIES       Current Outpatient Medications  Medication Sig Dispense Refill  . amLODipine (NORVASC) 2.5 MG tablet Take 1 tablet (2.5 mg total) by mouth daily.  90 tablet 3  . aspirin 81 MG tablet Take 81 mg by mouth daily.    . calcium carbonate (OSCAL) 1500 (600 Ca) MG TABS tablet Take 1 tablet by mouth 2 (two) times daily.  5  . cephALEXin (KEFLEX) 500 MG capsule Take 1 capsule (500 mg total) by mouth 3 (three) times daily. With food 15 capsule 0  . Cholecalciferol (VITAMIN D3) 400 units tablet Take 400 Units by mouth 2 (two) times daily.  3  . citalopram (CELEXA) 20 MG tablet Take 1 tablet (20 mg total) by mouth daily. 90 tablet 3  . COMBIGAN 0.2-0.5 % ophthalmic solution Instill 1 drop into both eyes twice a day  4  . dorzolamidel-timolol (COSOPT) 22.3-6.8 MG/ML SOLN ophthalmic solution PLACE 1 DROP IN EACH EYE 2 TIMES DAILY  3  . fish oil-omega-3 fatty acids 1000 MG capsule Take 2 g by mouth 2 (two) times daily after a meal.    . GNP CALCIUM 600 MG TABS tablet TAKE 1 TABLET BY MOUTH 2 TIMES DAILY 60 tablet 5  . levETIRAcetam (KEPPRA) 250 MG tablet TAKE 1 TABLET BY MOUTH 2 TIMES DAILY 60 tablet 11  . losartan (COZAAR) 50 MG tablet Take 1 tablet (50 mg total) by mouth daily. 90 tablet 3  . memantine (NAMENDA) 10 MG tablet TAKE 1 TABLET BY  MOUTH 2 TIMES DAILY 60 tablet 5  . Multiple Vitamins-Calcium (ONE-A-DAY WOMENS PO) Take by mouth daily.    Marland Kitchen ZIOPTAN 0.0015 % SOLN Administer 1 drop into both eyes at bedtime as directed  3   No current facility-administered medications for this visit.     Allergies:   Plaquenil [hydroxychloroquine sulfate] and Hydroxychloroquine    Social History:  The patient  reports that she quit smoking about 41 years ago. Her smoking use included cigarettes. She has a 15.00 pack-year smoking history. She has never used smokeless tobacco. She reports that she drinks about 4.2 oz of alcohol per week. She reports that she does not use drugs.   Family History:  The patient's family history includes Dementia in her father; Heart disease in her maternal grandfather and maternal grandmother; Hypertension in her mother.    ROS:   Please see the history of present illness.   Otherwise, review of systems are positive for none.   All other systems are reviewed and negative.    PHYSICAL EXAM: VS:  BP (!) 162/80   Pulse (!) 52   Ht 5' (1.524 m)   Wt 111 lb (50.3 kg)   LMP 08/03/1993 (Approximate)   SpO2 97%   BMI 21.68 kg/m  , BMI Body mass index is 21.68 kg/m. Affect appropriate Healthy:  appears stated age HEENT: normal Neck supple with no adenopathy JVP normal no bruits no thyromegaly Lungs clear with no wheezing and good diaphragmatic motion Heart:  S1/S2 no murmur, no rub, gallop or click PMI normal Abdomen: benighn, BS positve, no tenderness, no AAA no bruit.  No HSM or HJR Distal pulses intact with no bruits No edema Neuro non-focal Skin warm and dry No muscular weakness    EKG:  SB rate 40 no AV block LAE 09/03/17   Recent Labs: 02/09/2017: ALT 23; BUN 21; Creatinine, Ser 0.65; Hemoglobin 13.6; Platelets 297.0; Potassium 4.5; Sodium 139; TSH 2.31    Lipid Panel    Component Value Date/Time   CHOL 211 (H) 02/09/2017 1021   TRIG 299.0 (H) 02/09/2017 1021   HDL 43.10 02/09/2017 1021   CHOLHDL 5 02/09/2017 1021   VLDL 59.8 (H) 02/09/2017 1021   LDLCALC 129 (H) 01/02/2016 1710   LDLDIRECT 129.0 02/09/2017 1021      Wt Readings from Last 3 Encounters:  09/08/17 111 lb (50.3 kg)  09/03/17 110 lb 6.4 oz (50.1 kg)  03/25/17 106 lb 3.2 oz (48.2 kg)      Other studies Reviewed: Additional studies/ records that were reviewed today include: Notes from primary labs and ECG .    ASSESSMENT AND PLAN:  1.  Bradycardia:  Need to recheck TSH/Bmet.  Stop Aricept F/U ETT to check for chronotropic incompetence TTE to assess EF and make sure no structural heart disease silent IMI ect.  2. Dementia: Stop aricept as it is associated with bradycardia 3. Continue current non AV nodal blocking drugs including ARB 4. Depression : stable continue Celexa   Current medicines are reviewed at length with  the patient today.  The patient does not have concerns regarding medicines.  The following changes have been made:  D/C Aricept  Labs/ tests ordered today include: ETT TTE   Orders Placed This Encounter  Procedures  . Basic metabolic panel  . TSH  . EXERCISE TOLERANCE TEST (ETT)  . ECHOCARDIOGRAM COMPLETE     Disposition:   FU with EP in 6 weeks      Signed, Charlton Haws, MD  09/08/2017 2:37 PM    [facility] [address], [place], [place]  [address] Phone: [phone]; Fax: [phone]

## 2017-09-09 DIAGNOSIS — H04123 Dry eye syndrome of bilateral lacrimal glands: Secondary | ICD-10-CM | POA: Diagnosis not present

## 2017-09-09 LAB — BASIC METABOLIC PANEL
BUN / CREAT RATIO: 32 — AB (ref 12–28)
BUN: 19 mg/dL (ref 8–27)
CALCIUM: 10 mg/dL (ref 8.7–10.3)
CHLORIDE: 103 mmol/L (ref 96–106)
CO2: 23 mmol/L (ref 20–29)
Creatinine, Ser: 0.59 mg/dL (ref 0.57–1.00)
GFR calc Af Amer: 104 mL/min/{1.73_m2} (ref >59)
GFR calc non Af Amer: 91 mL/min/{1.73_m2} (ref >59)
GLUCOSE: 79 mg/dL (ref 65–99)
POTASSIUM: 4.3 mmol/L (ref 3.5–5.2)
Sodium: 140 mmol/L (ref 134–144)

## 2017-09-09 LAB — TSH: TSH: 1.63 u[IU]/mL (ref 0.450–4.500)

## 2017-09-10 ENCOUNTER — Ambulatory Visit (INDEPENDENT_AMBULATORY_CARE_PROVIDER_SITE_OTHER): Payer: Medicare Other

## 2017-09-10 DIAGNOSIS — R001 Bradycardia, unspecified: Secondary | ICD-10-CM | POA: Diagnosis not present

## 2017-09-10 LAB — EXERCISE TOLERANCE TEST
Estimated workload: 4.6 METS
Exercise duration (min): 2 min
Exercise duration (sec): 47 s
MPHR: 146 {beats}/min
Peak HR: 116 {beats}/min
Percent HR: 79 %
RPE: 14
Rest HR: 45 {beats}/min

## 2017-10-06 DIAGNOSIS — H04123 Dry eye syndrome of bilateral lacrimal glands: Secondary | ICD-10-CM | POA: Diagnosis not present

## 2017-10-06 DIAGNOSIS — H18423 Band keratopathy, bilateral: Secondary | ICD-10-CM | POA: Diagnosis not present

## 2017-10-06 DIAGNOSIS — H401131 Primary open-angle glaucoma, bilateral, mild stage: Secondary | ICD-10-CM | POA: Diagnosis not present

## 2017-10-06 DIAGNOSIS — H16143 Punctate keratitis, bilateral: Secondary | ICD-10-CM | POA: Diagnosis not present

## 2017-12-16 DIAGNOSIS — H04123 Dry eye syndrome of bilateral lacrimal glands: Secondary | ICD-10-CM | POA: Diagnosis not present

## 2017-12-16 DIAGNOSIS — H40023 Open angle with borderline findings, high risk, bilateral: Secondary | ICD-10-CM | POA: Diagnosis not present

## 2017-12-16 DIAGNOSIS — Z961 Presence of intraocular lens: Secondary | ICD-10-CM | POA: Diagnosis not present

## 2017-12-16 LAB — CBC AND DIFFERENTIAL
HEMATOCRIT: 40 (ref 36–46)
Hemoglobin: 13.9 (ref 12.0–16.0)
Neutrophils Absolute: 5
PLATELETS: 295 (ref 150–399)
WBC: 8.3

## 2017-12-23 ENCOUNTER — Telehealth: Payer: Self-pay | Admitting: Obstetrics and Gynecology

## 2017-12-23 NOTE — Telephone Encounter (Signed)
Returned call to VintonNancy at [facility]. Recent rheumatoid factor and ANA labs elevated, patient has request copy of labs to Dr. Edward JollySilva for f/u.   Advised can fax copy of labs to Dr. Marjorie SmolderSilva FYI, but patient needs to f/u with PCP. PCP on file [name], [facility], last seen 09/03/17. Contact info provided. Harriett Sineancy will return call to patient to confirm.   Routing to provider for final review. Will close encounter.

## 2017-12-23 NOTE — Telephone Encounter (Signed)
April Chung with [facility] called and left a message after hours requesting a call back from the nurse regarding labs done at their office there are concerns about.

## 2018-01-11 ENCOUNTER — Other Ambulatory Visit: Payer: Self-pay | Admitting: Neurology

## 2018-01-11 ENCOUNTER — Telehealth: Payer: Self-pay | Admitting: Nurse Practitioner

## 2018-01-11 DIAGNOSIS — G40909 Epilepsy, unspecified, not intractable, without status epilepticus: Secondary | ICD-10-CM

## 2018-01-12 ENCOUNTER — Encounter: Payer: Self-pay | Admitting: Nurse Practitioner

## 2018-01-12 ENCOUNTER — Telehealth: Payer: Self-pay

## 2018-01-12 DIAGNOSIS — Z789 Other specified health status: Secondary | ICD-10-CM

## 2018-01-12 DIAGNOSIS — R768 Other specified abnormal immunological findings in serum: Secondary | ICD-10-CM

## 2018-01-12 NOTE — Telephone Encounter (Signed)
Talk to Mr. Katrinka BlazingSmith, inform him that we do have the record. We will call her eyes doctor and find out [phone]

## 2018-01-12 NOTE — Telephone Encounter (Signed)
Spoke with Dr. Lafayette DragonStoneciper rep at [phone]. She will fax over the office note/report of last ov. Waiting for the fax.

## 2018-01-12 NOTE — Telephone Encounter (Signed)
Copied from CRM [id number]. Topic: General - Inquiry >> Jan 11, 2018  3:25 PM Jilda Rocheemaray, Melissa wrote: Reason for CRM: Patients husband called to state that his wife had some tests done with a Dr. Lafayette DragonStoneciper and eye doctor, he would like to know if you received the results from this test.  Best call back is [phone]

## 2018-01-13 ENCOUNTER — Encounter: Payer: Self-pay | Admitting: Nurse Practitioner

## 2018-01-13 LAB — RHEUMATOID FACTORS, FLUID: RHEUMATOID ARTHRITIS FACTOR: 54.1

## 2018-01-13 LAB — ANA: ANA Ab, IFA: POSITIVE

## 2018-01-13 NOTE — Addendum Note (Signed)
Addended byLivingston Diones: Adeel Guiffre on: 01/13/2018 01:56 PM   Modules accepted: Orders

## 2018-01-13 NOTE — Telephone Encounter (Signed)
Copy of lab and eye exam received. Place on Charlotte's desk for review.

## 2018-01-13 NOTE — Telephone Encounter (Signed)
Spoke with Dr. Lafayette DragonStoneciper office, abnormal lab result will be fax over to our [facility]. Waiting for the fax.

## 2018-01-13 NOTE — Telephone Encounter (Signed)
Pt is agree to referral, referral placed.

## 2018-01-13 NOTE — Telephone Encounter (Signed)
Reviewed lab results from ophthalmology. Positive ANA and Rheumatoid factor are indications of possible autoimmune disease. April Chung will need further evaluation by rheumatology. Please ask husband if ok to enter referral?

## 2018-01-13 NOTE — Progress Notes (Signed)
Abstracted result and sent to scan  

## 2018-01-15 ENCOUNTER — Encounter

## 2018-02-11 ENCOUNTER — Encounter: Payer: Self-pay | Admitting: Adult Health

## 2018-02-11 ENCOUNTER — Ambulatory Visit: Payer: Medicare Other | Admitting: Adult Health

## 2018-02-11 VITALS — BP 126/86 | HR 58 | Ht 60.0 in | Wt 117.4 lb

## 2018-02-11 DIAGNOSIS — R413 Other amnesia: Secondary | ICD-10-CM

## 2018-02-11 DIAGNOSIS — G40909 Epilepsy, unspecified, not intractable, without status epilepticus: Secondary | ICD-10-CM | POA: Diagnosis not present

## 2018-02-11 NOTE — Progress Notes (Signed)
PATIENT: April RainwaterMyra M Fleites DOB: 09/25/1945  REASON FOR VISIT: follow up HISTORY FROM: patient  Chief Complaint  Patient presents with  . Memory Loss    husbandRosanne Ashing- Jim  MMSE 21  . Seizures  . Follow-up    one year    HISTORY OF PRESENT ILLNESS: Today 02/11/18  April Chung is a 73 year old right handed female who presents today for follow up regarding seizures and memory loss. She is taking Namenda 10mg  twice daily. She had to stop Aricept due to decreased heart rate. Mr April Chung states that he has not been able to tell much of a difference since stopping Aricept with the exception of slower movements. She is tolerating Namenda well. MMSE today is 21/30. In 03/2017 she scored 27/30. She continues to perform all ADL's independently. Mr April Chung works during the day and she is doing well staying home alone. He does assist with dosing medications. She has become reclusive and typically does not like to go out much per her husband.  She is taking Keppra 250mg  twice daily. She denies seizure like activity. She is tolerating Keppra well without excessive drowsiness.     HISTORY: (copied from Dr Anne HahnWillis' note from 02/10/2017) [name] is a 73 year old right-handed white female with a history of a mild memory disturbance and history of seizures.  The patient is on Aricept taking 10 mg daily, she is on Namenda taking 10 mg twice daily.  She tolerates these medications well.  She is on low-dose Keppra, she tolerates this drug also without drowsiness.  She has not had any recurring seizures since last seen, she does operate a motor vehicle without difficulty.  The patient has not noted any progression of balance issues, she denies any progression of memory.  She returns to this office for an evaluation  REVIEW OF SYSTEMS: Out of a complete 14 system review of symptoms, the patient complains only of the following symptoms: memory loss. All other reviewed systems are negative.  Memory loss  ALLERGIES: Allergies    Allergen Reactions  . Aricept [Donepezil Hcl] Other (See Comments)    Low heart rate, do not take per cardiologist  . Plaquenil [Hydroxychloroquine Sulfate]   . Hydroxychloroquine Rash    HOME MEDICATIONS: Outpatient Medications Prior to Visit  Medication Sig Dispense Refill  . amLODipine (NORVASC) 2.5 MG tablet Take 1 tablet (2.5 mg total) by mouth daily. 90 tablet 3  . aspirin 81 MG tablet Take 81 mg by mouth daily.    . calcium carbonate (OSCAL) 1500 (600 Ca) MG TABS tablet Take 1 tablet by mouth 2 (two) times daily.  5  . Cholecalciferol (VITAMIN D3) 400 units tablet Take 400 Units by mouth 2 (two) times daily.  3  . citalopram (CELEXA) 20 MG tablet Take 1 tablet (20 mg total) by mouth daily. 90 tablet 3  . COMBIGAN 0.2-0.5 % ophthalmic solution Instill 1 drop into both eyes twice a day  4  . dorzolamidel-timolol (COSOPT) 22.3-6.8 MG/ML SOLN ophthalmic solution PLACE 1 DROP IN EACH EYE 2 TIMES DAILY  3  . fish oil-omega-3 fatty acids 1000 MG capsule Take 2 g by mouth 2 (two) times daily after a meal.    . GNP CALCIUM 600 MG TABS tablet TAKE 1 TABLET BY MOUTH 2 TIMES DAILY 60 tablet 5  . levETIRAcetam (KEPPRA) 250 MG tablet TAKE 1 TABLET BY MOUTH 2 TIMES DAILY 60 tablet 11  . losartan (COZAAR) 50 MG tablet Take 1 tablet (50 mg total)  by mouth daily. 90 tablet 3  . memantine (NAMENDA) 10 MG tablet TAKE 1 TABLET BY MOUTH 2 TIMES DAILY 60 tablet 5  . Multiple Vitamins-Calcium (ONE-A-DAY WOMENS PO) Take by mouth daily.    . cephALEXin (KEFLEX) 500 MG capsule Take 1 capsule (500 mg total) by mouth 3 (three) times daily. With food 15 capsule 0  . ZIOPTAN 0.0015 % SOLN Administer 1 drop into both eyes at bedtime as directed  3   No facility-administered medications prior to visit.     PAST MEDICAL HISTORY: Past Medical History:  Diagnosis Date  . Abnormal EEG 01/14/12   started on Keppra  . Dermatomyositis (HCC)    remission on pred until 2008[facility]  . GERD  (gastroesophageal reflux disease)    agravated with Fosamax  . Glaucoma   . Hypertension 2013  . Memory loss   . PMB (postmenopausal bleeding) 08/2003  . Seizures (HCC)   . Status post dilation of esophageal narrowing   . Urinary incontinence, mixed 07/01/11   Fitted for 2.75 " Ring Pessary with support  . Vitamin D deficiency disease     PAST SURGICAL HISTORY: Past Surgical History:  Procedure Laterality Date  . COLONOSCOPY  09/25/04   Dr. Loreta AveMann  . COLONOSCOPY  02/2009  . ENDOMETRIAL BIOPSY  08/24/03   weak proliferative endo, [facility] neg.  Marland Kitchen. ESOPHAGOGASTRODUODENOSCOPY ENDOSCOPY  04/29/04   anemia without cause  . NO PAST SURGERIES      FAMILY HISTORY: Family History  Problem Relation Age of Onset  . Dementia Father        died age 774, otherwise healthy  . Hypertension Mother   . Heart disease Maternal Grandfather   . Heart disease Maternal Grandmother   . Seizures Neg Hx     SOCIAL HISTORY: Social History   Socioeconomic History  . Marital status: Married    Spouse name: [name]  . Number of children: 1  . Years of education: hs  . Highest education level: Not on file  Occupational History  . Occupation: housewife  Social Needs  . Financial resource strain: Not on file  . Food insecurity:    Worry: Not on file    Inability: Not on file  . Transportation needs:    Medical: Not on file    Non-medical: Not on file  Tobacco Use  . Smoking status: Former Smoker    Packs/day: 1.00    Years: 15.00    Pack years: 15.00    Types: Cigarettes    Last attempt to quit: 02/04/1976    Years since quitting: 42.0  . Smokeless tobacco: Never Used  Substance and Sexual Activity  . Alcohol use: Yes    Alcohol/week: 7.0 standard drinks    Types: 7 Glasses of wine per week    Comment: 1 glass of wine per day  . Drug use: No  . Sexual activity: Never    Partners: Male    Birth control/protection: Post-menopausal  Lifestyle  . Physical activity:    Days per week: Not on file      Minutes per session: Not on file  . Stress: Not on file  Relationships  . Social connections:    Talks on phone: Not on file    Gets together: Not on file    Attends religious service: Not on file    Active member of club or organization: Not on file    Attends meetings of clubs or organizations: Not on  file    Relationship status: Not on file  . Intimate partner violence:    Fear of current or ex partner: Not on file    Emotionally abused: Not on file    Physically abused: Not on file    Forced sexual activity: Not on file  Other Topics Concern  . Not on file  Social History Narrative   Married, lives with spouse [name]).   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   Patient has one child.   Patient is right-handed.   Patient drinks 2-3 cups of coffee daily and maybe half cup at night.                  PHYSICAL EXAM  Vitals:   02/11/18 1322  BP: 126/86  Pulse: (!) 58  Weight: 117 lb 6.4 oz (53.3 kg)  Height: 5' (1.524 m)   Body mass index is 22.93 kg/m.  Generalized: Well developed, in no acute distress  Cardiology: Regularly irregular rhythm (history of bigeminy mentioned by cardiologist) Neurological examination  Mentation: Alert oriented to time, place, history taking. Follows all commands speech and language fluent Cranial nerve II-XII: Pupils were equal round reactive to light. Extraocular movements were full, visual field were full on confrontational test. Facial sensation and strength were normal. Uvula tongue midline. Head turning and shoulder shrug  were normal and symmetric. Motor: The motor testing reveals 5 over 5 strength of all 4 extremities. Good symmetric motor tone is noted throughout.  Sensory: Sensory testing is intact to soft touch on all 4 extremities. No evidence of extinction is noted.  Coordination: Cerebellar testing reveals good finger-nose-finger and heel-to-shin bilaterally.   Gait and station: Gait is slow.  Reflexes: Deep tendon reflexes are absent in bilateral upper extremities. Reflexes normal and symmetric bilaterally in lower extremitites.   DIAGNOSTIC DATA (LABS, IMAGING, TESTING) - I reviewed patient records, labs, notes, testing and imaging myself where available.  MMSE - Mini Mental State Exam 02/11/2018 03/25/2017 02/10/2017  Orientation to time 2 5 5   Orientation to Place 3 5 5   Registration 3 3 3   Attention/ Calculation 4 5 5   Recall 2 0 3  Language- name 2 objects 2 2 2   Language- repeat 0 1 1  Language- follow 3 step command 3 3 3   Language- read & follow direction 1 1 1   Write a sentence 1 1 1   Copy design 0 1 1  Total score 21 27 30     Lab Results  Component Value Date   WBC 10.5 02/09/2017   HGB 13.6 02/09/2017   HCT 41.1 02/09/2017   MCV 96.9 02/09/2017   PLT 297.0 02/09/2017      Component Value Date/Time   NA 140 09/08/2017 1437   K 4.3 09/08/2017 1437   CL 103 09/08/2017 1437   CO2 23 09/08/2017 1437   GLUCOSE 79 09/08/2017 1437   GLUCOSE 80 02/09/2017 1021   BUN 19 09/08/2017 1437   CREATININE 0.59 09/08/2017 1437   CALCIUM 10.0 09/08/2017 1437   PROT 7.2 02/09/2017 1021   PROT 7.5 02/07/2016 1256   ALBUMIN 4.0 02/09/2017 1021   ALBUMIN 4.5 02/07/2016 1256   AST 28 02/09/2017 1021   ALT 23 02/09/2017 1021   ALKPHOS 64 02/09/2017 1021   BILITOT 0.6 02/09/2017 1021   BILITOT 0.5 02/07/2016 1256   GFRNONAA 91 09/08/2017 1437   GFRAA 104 09/08/2017 1437   Lab Results  Component Value Date   CHOL 211 (H) 02/09/2017   HDL 43.10 02/09/2017   LDLCALC 129 (H) 01/02/2016   LDLDIRECT 129.0 02/09/2017   TRIG 299.0 (H) 02/09/2017   CHOLHDL 5 02/09/2017   No results found for: HGBA1C Lab Results  Component Value Date   VITAMINB12 983 02/07/2016   Lab Results  Component Value Date   TSH 1.630 09/08/2017      ASSESSMENT AND PLAN 73 y.o. year old female  has a past medical history of Abnormal EEG (01/14/12),  Dermatomyositis (HCC), GERD (gastroesophageal reflux disease), Glaucoma, Hypertension (2013), Memory loss, PMB (postmenopausal bleeding) (08/2003), Seizures (HCC), Status post dilation of esophageal narrowing, Urinary incontinence, mixed (07/01/11), and Vitamin D deficiency disease. here with   1. Seizure  2. Memory loss  She is tolerating medications well. She did have to stop Aricept due to low heart rate, however, Mr Stahl has not noticed a significant difference in her memory loss at home. She continues to be independent at home with the exception of dosing medications She will continue Namenda and Keppra as prescribed. Continue follow up with cardiology for bradycardia and irregular rhythm. Follow up in 1 year.     Butch Penny, MSN, NP-C 02/11/2018, 3:29 PM [facility] [address] [place], [place] [address] [phone]

## 2018-02-11 NOTE — Patient Instructions (Signed)
Your Plan:  Continue using Keppra Memory score is slightly decreased Continue namenda If your symptoms worsen or you develop new symptoms please let us know.   Thank you for [facility]. I hope we have been able to provide you high quality care today.  You may receive a patient satisfaction survey over the next few weeks. We would appreciate your feedback and comments so that we may continue to improve ourselves and the health of our patients.

## 2018-02-11 NOTE — Progress Notes (Signed)
I have read the note, and I agree with the clinical assessment and plan.  Charles K Willis   

## 2018-02-15 ENCOUNTER — Other Ambulatory Visit: Payer: Self-pay | Admitting: Nurse Practitioner

## 2018-02-15 NOTE — Telephone Encounter (Signed)
Please advise, last Vitamin D check was 12/2015.

## 2018-02-18 ENCOUNTER — Telehealth: Payer: Self-pay

## 2018-02-18 MED ORDER — CALCIUM 600 MG PO TABS: 600.0000 mg | ORAL_TABLET | Freq: Two times a day (BID) | ORAL | 3 refills | Status: DC

## 2018-02-18 NOTE — Telephone Encounter (Signed)
[facility] calcium 600 mg tab take 1 tab PO twice daily  RX sent

## 2018-03-15 ENCOUNTER — Encounter: Payer: Self-pay | Admitting: Nurse Practitioner

## 2018-03-17 ENCOUNTER — Ambulatory Visit: Payer: Medicare Other | Admitting: Obstetrics and Gynecology

## 2018-05-07 IMAGING — CT CT HEAD W/O CM
3 series · 15 of 47 positions shown, 18 images · non-contrast
Comparison: 12/23/2011 MRI of the head.  03/31/2006 CT of the head.

CLINICAL DATA: 71 y/o  F; presyncope and urinary incontinence.

EXAM:
CT HEAD WITHOUT CONTRAST
TECHNIQUE: Contiguous axial images were obtained from the base of the skull
through the vertex without intravenous contrast.

[Series 2: head 5.0 h37s · axial · 0.42mm/px · z∈[-142,-17]mm · 9 of 31 slices shown, 12 images]
[im 3/31  brain]
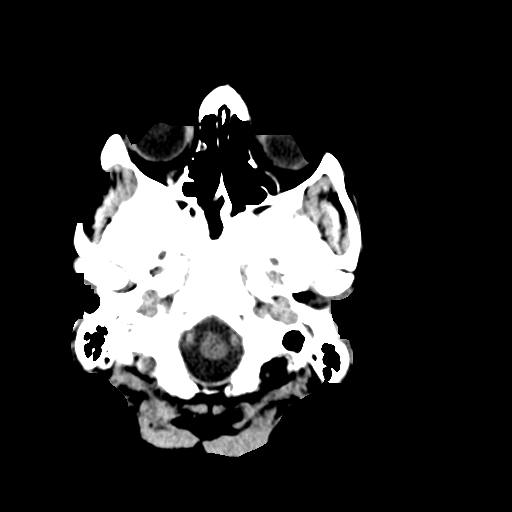
[im 3/31  bone]
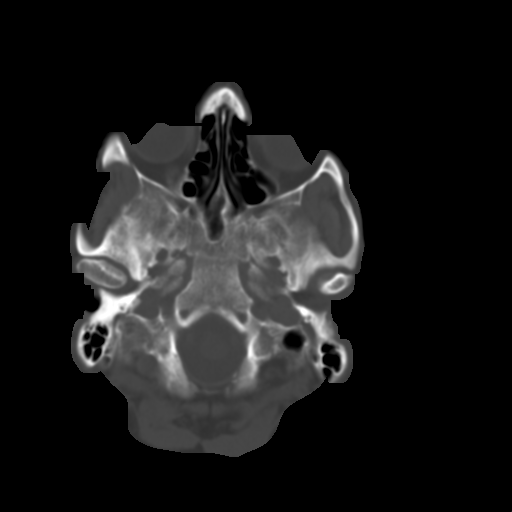
[im 6/31  brain]
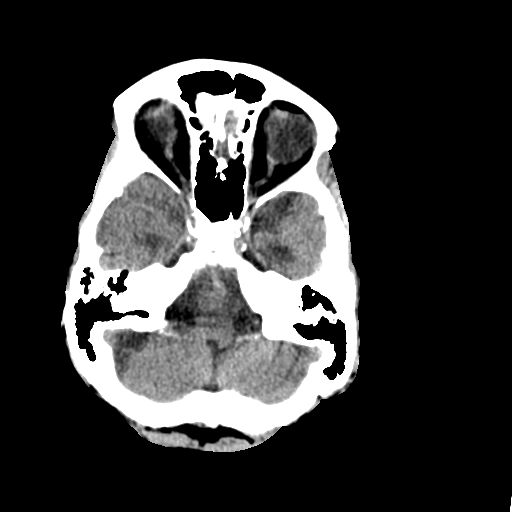
[im 9/31  brain]
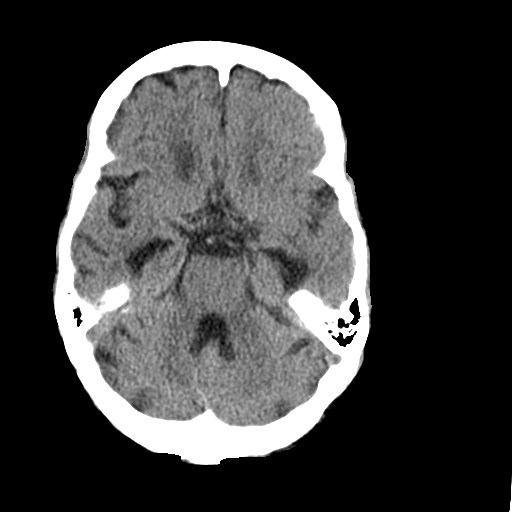
[im 12/31  brain]
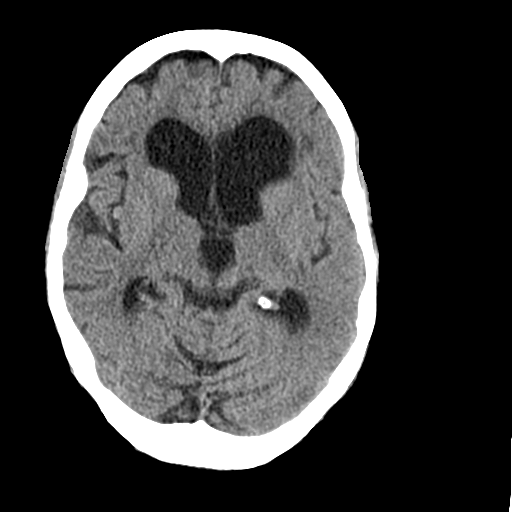
[im 16/31  brain]
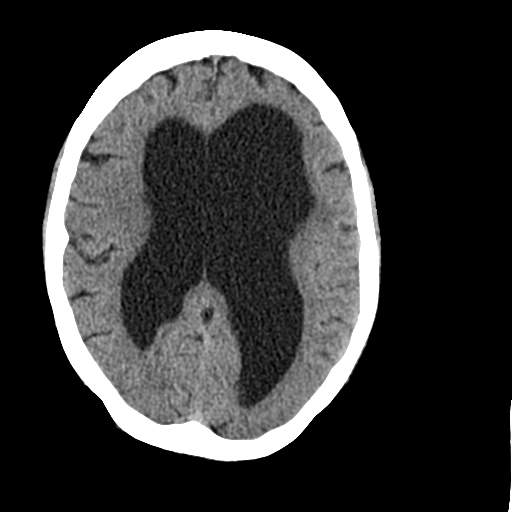
[im 16/31  bone]
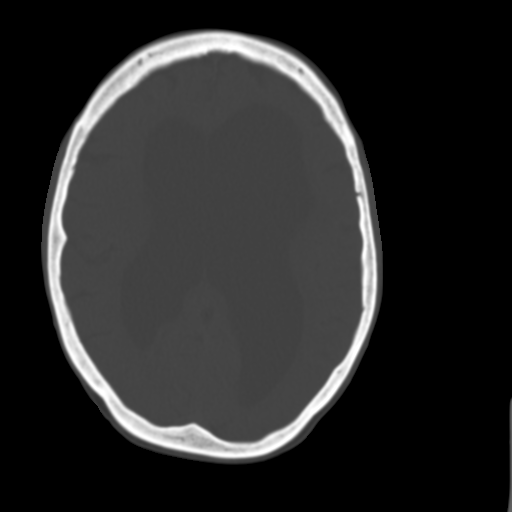
[im 19/31  brain]
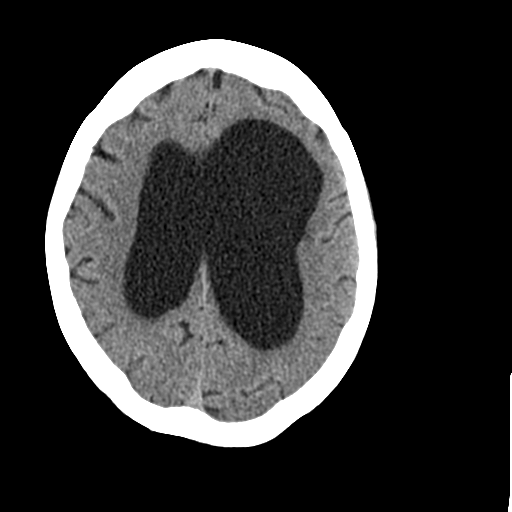
[im 22/31  brain]
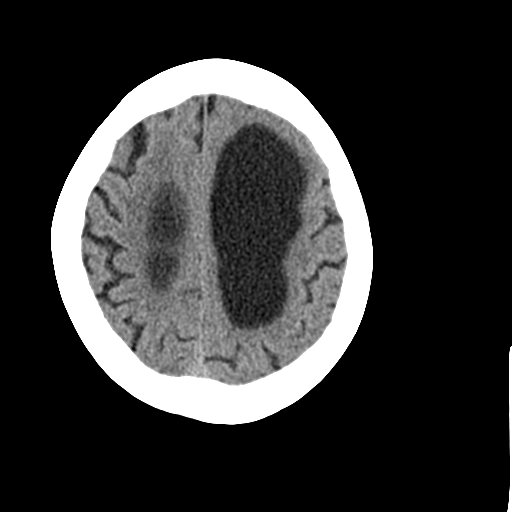
[im 25/31  brain]
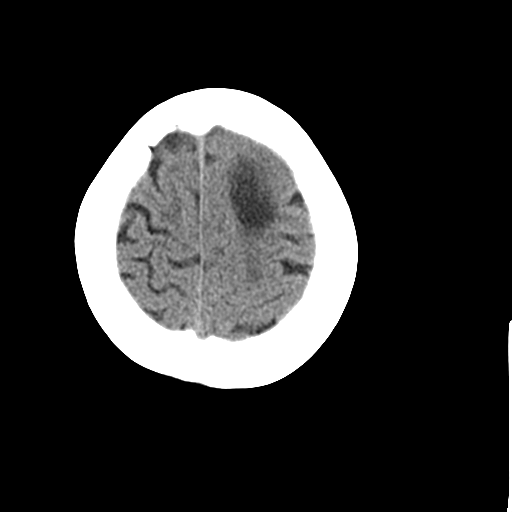
[im 28/31  brain]
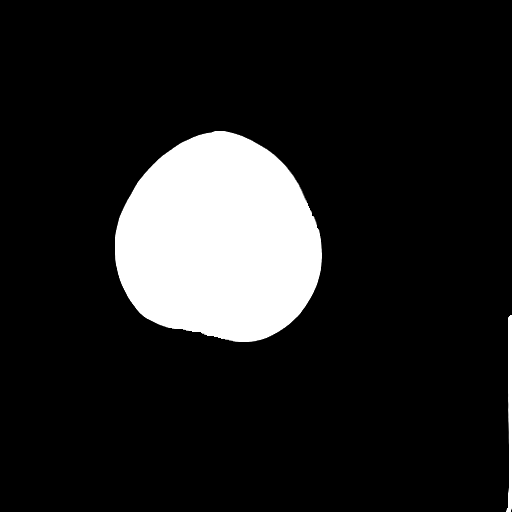
[im 28/31  bone]
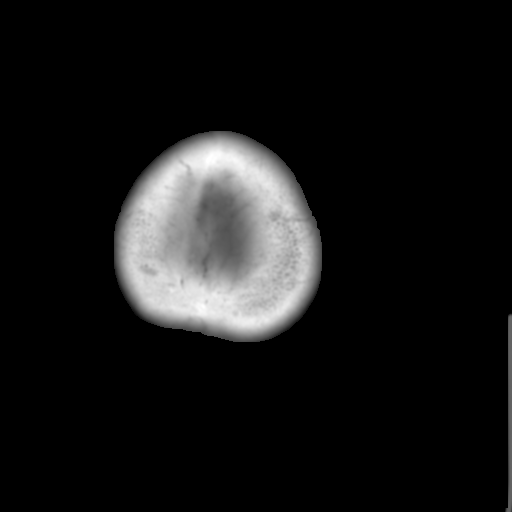

[Series 4: head 3.0 mpr cor · coronal · 0.30mm/px · 3 of 84 slices shown]
[im 28/84  brain]
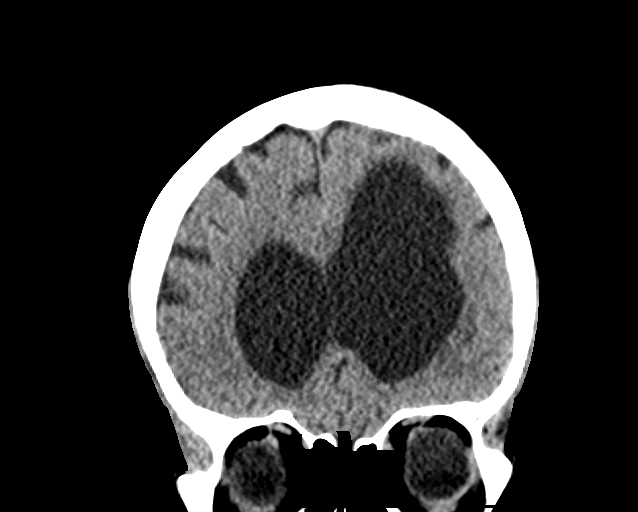
[im 37/84  brain]
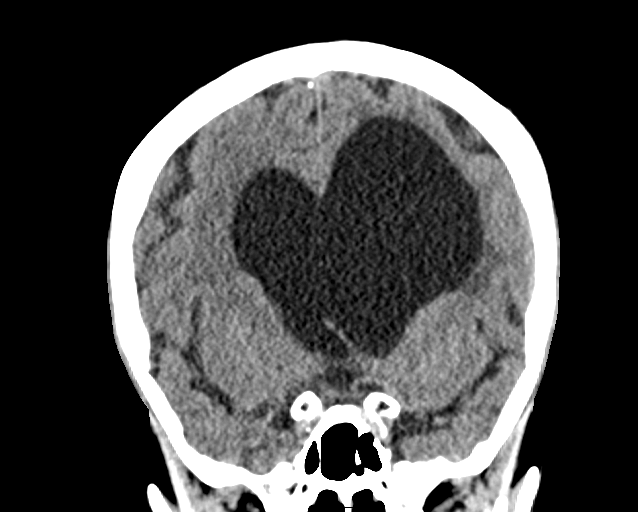
[im 47/84  brain]
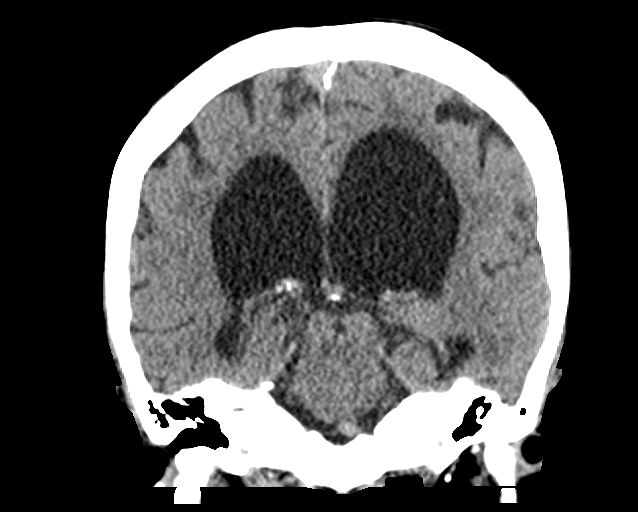

[Series 5: head 3.0 mpr sag · sagittal · 0.30mm/px · 3 of 64 slices shown]
[im 22/64  brain]
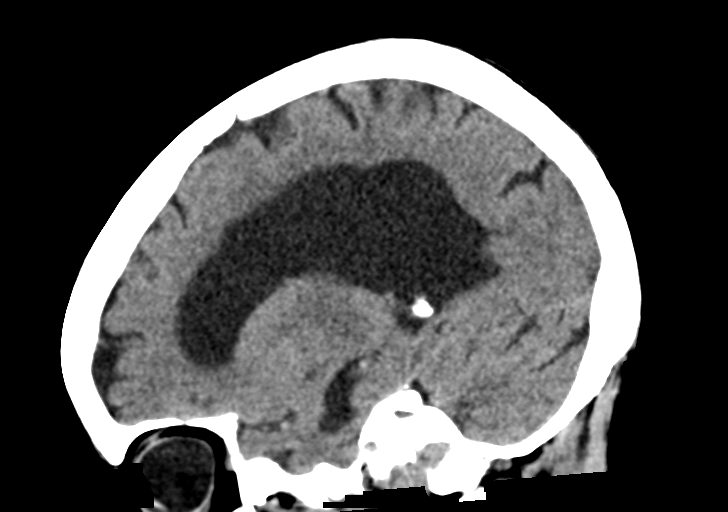
[im 32/64  brain]
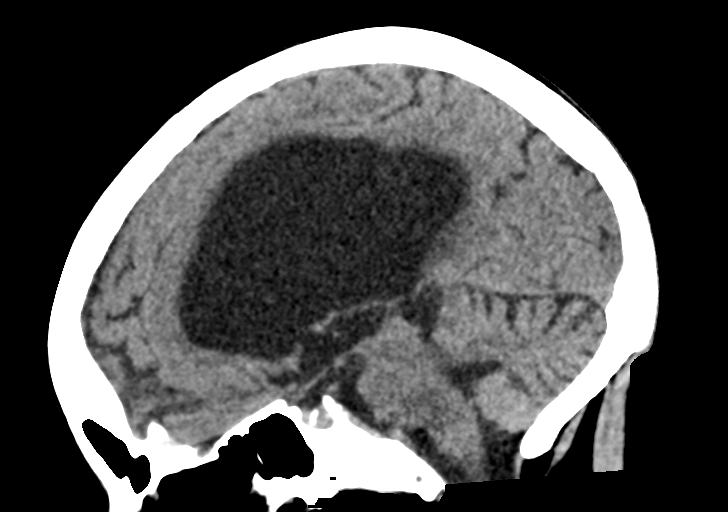
[im 43/64  brain]
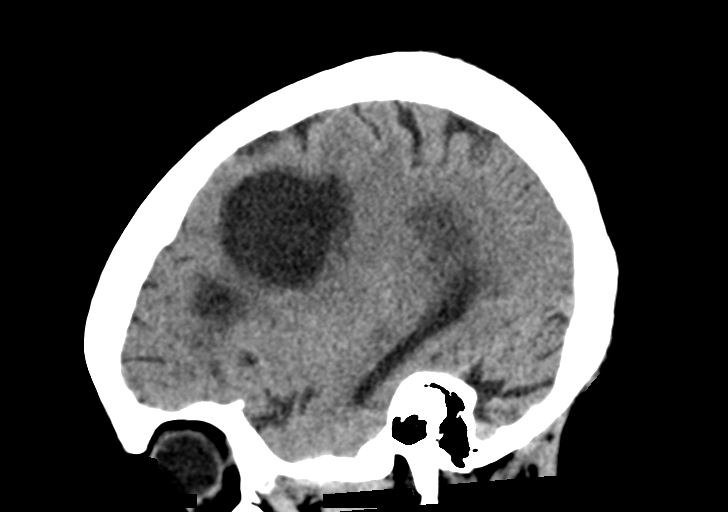

[15 of 47 positions shown; findings below may reference images not displayed]

FINDINGS: Brain: No evidence of acute infarction, intracranial hemorrhage, or
focal mass effect. Stable severe lateral and third ventriculomegaly
with prominence of the frontal horn of left lateral ventricle.
Stable prominent volume loss in the left frontal lobe.

Vascular: Calcific atherosclerosis of the carotid bifurcations.

Skull: Normal. Negative for fracture or focal lesion.

Sinuses/Orbits: No acute finding.

Other: None.
IMPRESSION: 1. No acute intracranial abnormality is identified.
2. Stable severe lateral and third ventriculomegaly.

By: Kinger Bysum M.D.

## 2018-06-07 ENCOUNTER — Other Ambulatory Visit: Payer: Self-pay | Admitting: Nurse Practitioner

## 2018-06-07 ENCOUNTER — Other Ambulatory Visit: Payer: Self-pay | Admitting: Adult Health

## 2018-06-07 DIAGNOSIS — I10 Essential (primary) hypertension: Secondary | ICD-10-CM

## 2018-06-07 DIAGNOSIS — E559 Vitamin D deficiency, unspecified: Secondary | ICD-10-CM

## 2018-06-08 ENCOUNTER — Encounter: Payer: Self-pay | Admitting: Nurse Practitioner

## 2018-06-08 ENCOUNTER — Ambulatory Visit (INDEPENDENT_AMBULATORY_CARE_PROVIDER_SITE_OTHER): Payer: Medicare Other | Admitting: Nurse Practitioner

## 2018-06-08 VITALS — Temp 96.5°F | Ht 60.0 in | Wt 117.0 lb

## 2018-06-08 DIAGNOSIS — F339 Major depressive disorder, recurrent, unspecified: Secondary | ICD-10-CM

## 2018-06-08 DIAGNOSIS — I10 Essential (primary) hypertension: Secondary | ICD-10-CM | POA: Diagnosis not present

## 2018-06-08 MED ORDER — LOSARTAN POTASSIUM 50 MG PO TABS: 50.0000 mg | ORAL_TABLET | Freq: Every day | ORAL | 1 refills | Status: DC

## 2018-06-08 MED ORDER — CITALOPRAM HYDROBROMIDE 20 MG PO TABS: 20.0000 mg | ORAL_TABLET | Freq: Every day | ORAL | 1 refills | Status: DC

## 2018-06-08 MED ORDER — AMLODIPINE BESYLATE 2.5 MG PO TABS: 2.5000 mg | ORAL_TABLET | Freq: Every day | ORAL | 1 refills | Status: DC

## 2018-06-08 NOTE — Patient Instructions (Signed)
Continue current medication. Encourage to do home chair exercise to maintain muscle strength and tone.  f/up in 9months.

## 2018-06-08 NOTE — Progress Notes (Signed)
Virtual Visit via Video Note  I connected with April Chung on 06/08/18 at  3:30 PM EDT by a video enabled telemedicine application and verified that I am speaking with the correct person using two identifiers.  Location: Patient: Home Provider: Office   Husband present during video call.  I discussed the limitations of evaluation and management by telemedicine and the availability of in person appointments. The patient expressed understanding and agreed to proceed.   CC: follow up on HTN and hyperlipidemia-med refills--no BP machine.   History of Present Illness: Unable to provide any BP readings today. HTN: stable with amlodipine and losartan. BP Readings from Last 3 Encounters:  02/11/18 126/86  09/08/17 (!) 162/80  09/03/17 140/72   Wt Readings from Last 3 Encounters:  06/08/18 117 lb (53.1 kg)  02/11/18 117 lb 6.4 oz (53.3 kg)  09/08/17 111 lb (50.3 kg)   Husband denies nay change in mental status.  Review of Systems  Constitutional: Negative.   Respiratory: Negative.   Cardiovascular: Negative.   Musculoskeletal: Negative for falls.  Neurological: Negative.    Observations/Objective: Physical Exam  Constitutional: No distress.  Pulmonary/Chest: Effort normal.  Neurological: She is alert.  Oriented to person, place, and family  Psychiatric: She has a normal mood and affect. Her behavior is normal.   Assessment and Plan: April Chung was seen today for follow-up.  Diagnoses and all orders for this visit:  Essential hypertension -     amLODipine (NORVASC) 2.5 MG tablet; Take 1 tablet (2.5 mg total) by mouth daily. -     losartan (COZAAR) 50 MG tablet; Take 1 tablet (50 mg total) by mouth daily.  Recurrent major depressive disorder, remission status unspecified (HCC) -     citalopram (CELEXA) 20 MG tablet; Take 1 tablet (20 mg total) by mouth daily.   Follow Up Instructions: Continue current medication. Encourage to do home chair exercise to maintain muscle  strength and tone.  f/up in 52months.  I discussed the assessment and treatment plan with the patient. The patient was provided an opportunity to ask questions and all were answered. The patient agreed with the plan and demonstrated an understanding of the instructions.   The patient was advised to call back or seek an in-person evaluation if the symptoms worsen or if the condition fails to improve as anticipated.  Alysia Penna, NP

## 2018-07-03 ENCOUNTER — Other Ambulatory Visit: Payer: Self-pay | Admitting: Neurology

## 2018-08-23 ENCOUNTER — Ambulatory Visit: Payer: Medicare Other | Admitting: Neurology

## 2018-09-06 DIAGNOSIS — H04123 Dry eye syndrome of bilateral lacrimal glands: Secondary | ICD-10-CM | POA: Diagnosis not present

## 2018-09-09 ENCOUNTER — Ambulatory Visit: Payer: Medicare Other | Admitting: Nurse Practitioner

## 2018-09-09 DIAGNOSIS — H168 Other keratitis: Secondary | ICD-10-CM | POA: Diagnosis not present

## 2018-09-09 DIAGNOSIS — H04123 Dry eye syndrome of bilateral lacrimal glands: Secondary | ICD-10-CM | POA: Diagnosis not present

## 2018-09-15 DIAGNOSIS — H168 Other keratitis: Secondary | ICD-10-CM | POA: Diagnosis not present

## 2018-09-29 DIAGNOSIS — H04123 Dry eye syndrome of bilateral lacrimal glands: Secondary | ICD-10-CM | POA: Diagnosis not present

## 2018-09-29 DIAGNOSIS — H40023 Open angle with borderline findings, high risk, bilateral: Secondary | ICD-10-CM | POA: Diagnosis not present

## 2018-09-29 DIAGNOSIS — Z961 Presence of intraocular lens: Secondary | ICD-10-CM | POA: Diagnosis not present

## 2018-11-03 ENCOUNTER — Ambulatory Visit (INDEPENDENT_AMBULATORY_CARE_PROVIDER_SITE_OTHER): Payer: Medicare Other | Admitting: Behavioral Health

## 2018-11-03 ENCOUNTER — Other Ambulatory Visit: Payer: Self-pay

## 2018-11-03 ENCOUNTER — Encounter: Payer: Self-pay | Admitting: Nurse Practitioner

## 2018-11-03 DIAGNOSIS — Z23 Encounter for immunization: Secondary | ICD-10-CM

## 2018-11-03 NOTE — Progress Notes (Signed)
Patient presents in clinic today for Influenza vaccination. IM injection was given in the right deltoid. Patient tolerated the injection well. No signs or symptoms of a reaction were noted prior to patient leaving the nurse visit. 

## 2018-11-15 DIAGNOSIS — H04123 Dry eye syndrome of bilateral lacrimal glands: Secondary | ICD-10-CM | POA: Diagnosis not present

## 2018-11-15 DIAGNOSIS — H40023 Open angle with borderline findings, high risk, bilateral: Secondary | ICD-10-CM | POA: Diagnosis not present

## 2018-11-15 DIAGNOSIS — H16003 Unspecified corneal ulcer, bilateral: Secondary | ICD-10-CM | POA: Diagnosis not present

## 2018-11-15 DIAGNOSIS — Z961 Presence of intraocular lens: Secondary | ICD-10-CM | POA: Diagnosis not present

## 2018-11-16 DIAGNOSIS — H18833 Recurrent erosion of cornea, bilateral: Secondary | ICD-10-CM | POA: Diagnosis not present

## 2018-11-16 DIAGNOSIS — H16223 Keratoconjunctivitis sicca, not specified as Sjogren's, bilateral: Secondary | ICD-10-CM | POA: Diagnosis not present

## 2018-11-16 DIAGNOSIS — H16003 Unspecified corneal ulcer, bilateral: Secondary | ICD-10-CM | POA: Diagnosis not present

## 2018-11-22 NOTE — Progress Notes (Signed)
PATIENT: April Chung DOB: [birth year]  REASON FOR VISIT: follow up HISTORY FROM: patient  Chief Complaint  Patient presents with  . Follow-up    Room 6, with husband. "memory"     HISTORY OF PRESENT ILLNESS: Today 11/23/18 April Chung is a 73 y.o. female here today for follow up of seizure ans memory loss. She continues Keppra  twice daily. She is also taking Namenda  twice daily. She was unable to tolerate Aricept due to low heart rate.  Overall, she feels that she is doing very well.  Her husband is with her today and agrees.  They feel that memory is fairly stable.  She is able to perform ADLs independently.  She does not drive.  She is not as active as she used to be.  Her husband mentions that walking has become much slower and pace.  He states that she seems stable in balance but moves her feet much slower than she used to.  He denies shuffling or stooped posture.  He has considered having her to use a walking stick but is concerned that she will not use it.  There have been no falls.  No strokelike symptoms.  No seizure activity.  She and her husband deny concerns of elevated blood pressures at home.  They admit that they do not routinely check.  Blood pressure was normal in the office with April Chung.  Initial blood pressure reading in office today was 182/100.  At completion of visit we recheck blood pressure and it is 151/77.  Her husband feels that normal blood pressure readings are 130s over 70s to 80s.  She is followed by primary care for hypertension.  She is currently taking Cozaar 50 mg daily as well as Norvasc 2.5 mg daily.  She denies headaches, dizziness, chest pain or trouble breathing.  HISTORY: (copied from [place] note on 02/11/2018)  April Chung is a 73 year old right handed female who presents today for follow up regarding seizures and memory loss. She is taking Namenda  twice daily. She had to stop Aricept due to decreased heart rate. April  April Chung states that he has not been able to tell much of a difference since stopping Aricept with the exception of slower movements. She is tolerating Namenda well. MMSE today is 21/30. In 03/2017 she scored 27/30. She continues to perform all ADL's independently. April Chung works during the day and she is doing well staying home alone. He does assist with dosing medications. She has become reclusive and typically does not like to go out much per her husband.  She is taking Keppra  twice daily. She denies seizure like activity. She is tolerating Keppra well without excessive drowsiness.   HISTORY: (copied from April Chung' note from 02/10/2017)  April Chung is a 73 year old right-handed white female with a history of a mild memory disturbance and history of seizures. The patient is on Aricept taking 10 mg daily, she is on Namenda taking 10 mg twice daily. She tolerates these medications well. She is on low-dose Keppra, she tolerates this drug also without drowsiness. She has not had any recurring seizures since last seen, she does operate a motor vehicle without difficulty. The patient has not noted any progression of balance issues, she denies any progression of memory. She returns to this office for an evaluation   REVIEW OF SYSTEMS: Out of a complete 14 system review of symptoms, the patient complains only of the following symptoms, dry  eye, gait disturbance, memory loss and all other reviewed systems are negative.  ALLERGIES: Allergies  Allergen Reactions  . Aricept [Donepezil Hcl] Other (See Comments)    Low heart rate, do not take per cardiologist  . Plaquenil [Hydroxychloroquine Sulfate]   . Hydroxychloroquine Rash    HOME MEDICATIONS: Outpatient Medications Prior to Visit  Medication Sig Dispense Refill  . amLODipine (NORVASC) 2.5 MG tablet Take 1 tablet (2.5 mg total) by mouth daily. 90 tablet 1  . aspirin 81 MG tablet Take 81 mg by mouth daily.    . calcium carbonate (OS-CAL) 600  MG tablet Take 1 tablet (600 mg total) by mouth 2 (two) times daily with a meal. 180 tablet 3  . Cholecalciferol (VITAMIN D3) 10 MCG (400 UNIT) tablet TAKE 1 TABLET BY MOUTH 2 TIMES DAILY 180 tablet 3  . citalopram (CELEXA) 20 MG tablet Take 1 tablet (20 mg total) by mouth daily. 90 tablet 1  . ferrous sulfate 325 (65 FE) MG tablet Take by mouth.    . fish oil-omega-3 fatty acids 1000 MG capsule Take 2 g by mouth 2 (two) times daily after a meal.    . GNP CALCIUM 600 MG TABS tablet TAKE 1 TABLET BY MOUTH 2 TIMES DAILY 60 tablet 5  . levETIRAcetam (KEPPRA) 250 MG tablet TAKE 1 TABLET BY MOUTH 2 TIMES DAILY 60 tablet 11  . losartan (COZAAR) 50 MG tablet Take 1 tablet (50 mg total) by mouth daily. 90 tablet 1  . Multiple Vitamins-Calcium (ONE-A-DAY WOMENS PO) Take by mouth daily.    . COMBIGAN 0.2-0.5 % ophthalmic solution Instill 1 drop into both eyes twice a day  4  . dorzolamidel-timolol (COSOPT) 22.3-6.8 MG/ML SOLN ophthalmic solution PLACE 1 DROP IN EACH EYE 2 TIMES DAILY  3  . memantine (NAMENDA) 10 MG tablet TAKE 1 TABLET BY MOUTH 2 TIMES DAILY 60 tablet 5   No facility-administered medications prior to visit.     PAST MEDICAL HISTORY: Past Medical History:  Diagnosis Date  . Abnormal EEG 01/14/12   started on Keppra  . Dermatomyositis ([place])    remission on pred until 2008, rheum at [facility]  . GERD (gastroesophageal reflux disease)    agravated with Fosamax  . Glaucoma   . Hypertension 2013  . Memory loss   . PMB (postmenopausal bleeding) 08/2003  . Seizures ([place])   . Status post dilation of esophageal narrowing   . Urinary incontinence, mixed 07/01/11   Fitted for 2.75 " Ring Pessary with support  . Vitamin D deficiency disease     PAST SURGICAL HISTORY: Past Surgical History:  Procedure Laterality Date  . COLONOSCOPY  09/25/04   April. Collene Mares  . COLONOSCOPY  02/2009  . ENDOMETRIAL BIOPSY  08/24/03   weak proliferative endo, [facility] neg.  Marland Kitchen ESOPHAGOGASTRODUODENOSCOPY  ENDOSCOPY  04/29/04   anemia without cause  . NO PAST SURGERIES      FAMILY HISTORY: Family History  Problem Relation Age of Onset  . Dementia Father        died age 29, otherwise healthy  . Hypertension Mother   . Heart disease Maternal Grandfather   . Heart disease Maternal Grandmother   . Seizures Neg Hx     SOCIAL HISTORY: Social History   Socioeconomic History  . Marital status: Married    Spouse name: Jeneen Rinks  . Number of children: 1  . Years of education: hs  . Highest education level: Not on file  Occupational History  .  Occupation: housewife  Social Needs  . Financial resource strain: Not on file  . Food insecurity    Worry: Not on file    Inability: Not on file  . Transportation needs    Medical: Not on file    Non-medical: Not on file  Tobacco Use  . Smoking status: Former Smoker    Packs/day: 1.00    Years: 15.00    Pack years: 15.00    Types: Cigarettes    Quit date: 02/04/1976    Years since quitting: 42.8  . Smokeless tobacco: Never Used  Substance and Sexual Activity  . Alcohol use: Yes    Alcohol/week: 7.0 standard drinks    Types: 7 Glasses of wine per week    Comment: 1 glass of wine per day  . Drug use: No  . Sexual activity: Never    Partners: Male    Birth control/protection: Post-menopausal  Lifestyle  . Physical activity    Days per week: Not on file    Minutes per session: Not on file  . Stress: Not on file  Relationships  . Social Musicianconnections    Talks on phone: Not on file    Gets together: Not on file    Attends religious service: Not on file    Active member of club or organization: Not on file    Attends meetings of clubs or organizations: Not on file    Relationship status: Not on file  . Intimate partner violence    Fear of current or ex partner: Not on file    Emotionally abused: Not on file    Physically abused: Not on file    Forced sexual activity: Not on file  Other Topics Concern  . Not on file  Social History  Narrative   Married, lives with spouse [name]([place]).   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   Patient has one child.   Patient is right-handed.   Patient drinks 2-3 cups of coffee daily and maybe half cup at night.                  PHYSICAL EXAM  Vitals:   11/23/18 1439  BP: (!) 182/100  Pulse: 68  Temp: 98.9 F (37.2 C)  Weight: 118 lb 9.6 oz (53.8 kg)  Height: 5' (1.524 m)   Body mass index is 23.16 kg/m.  Generalized: Well developed, in no acute distress  Cardiology: normal rate and rhythm, no murmur noted Neurological examination  Mentation: Alert oriented to time with exception of date, place, history taking. Follows all commands speech and language fluent Cranial nerve II-XII: Pupils were equal round reactive to light. Extraocular movements were full, visual field were full on confrontational test. Facial sensation and strength were normal. Uvula tongue midline. Head turning and shoulder shrug  were normal and symmetric. Motor: The motor testing reveals 5 over 5 strength of all 4 extremities. Good symmetric motor tone is noted throughout.  No bradykinesia noted with exam. Sensory: Sensory testing is intact to soft touch on all 4 extremities. No evidence of extinction is noted.  Coordination: Cerebellar testing reveals good finger-nose-finger and heel-to-shin bilaterally.  Gait and station: Gait is wide. Tandem gait is wide and unstable. Romberg is negative. No drift is seen.     DIAGNOSTIC DATA (LABS, IMAGING, TESTING) - I reviewed patient records, labs, notes, testing and imaging myself where available.  MMSE - Mini Mental State Exam 11/23/2018  02/11/2018 03/25/2017  Orientation to time 2 2 5   Orientation to Place 3 3 5   Registration 3 3 3   Attention/ Calculation 2 4 5   Recall 1 2 0  Language- name 2 objects 2 2 2   Language- repeat 1 0 1  Language- follow 3 step command 3 3 3   Language- read & follow  direction 1 1 1   Write a sentence 1 1 1   Copy design 1 0 1  Copy design-comments named 5 animals - -  Total score 20 21 27      Lab Results  Component Value Date   WBC 10.5 02/09/2017   HGB 13.6 02/09/2017   HCT 41.1 02/09/2017   MCV 96.9 02/09/2017   PLT 297.0 02/09/2017      Component Value Date/Time   NA 140 09/08/2017 1437   K 4.3 09/08/2017 1437   CL 103 09/08/2017 1437   CO2 23 09/08/2017 1437   GLUCOSE 79 09/08/2017 1437   GLUCOSE 80 02/09/2017 1021   BUN 19 09/08/2017 1437   CREATININE 0.59 09/08/2017 1437   CALCIUM 10.0 09/08/2017 1437   PROT 7.2 02/09/2017 1021   PROT 7.5 02/07/2016 1256   ALBUMIN 4.0 02/09/2017 1021   ALBUMIN 4.5 02/07/2016 1256   AST 28 02/09/2017 1021   ALT 23 02/09/2017 1021   ALKPHOS 64 02/09/2017 1021   BILITOT 0.6 02/09/2017 1021   BILITOT 0.5 02/07/2016 1256   GFRNONAA 91 09/08/2017 1437   GFRAA 104 09/08/2017 1437   Lab Results  Component Value Date   CHOL 211 (H) 02/09/2017   HDL 43.10 02/09/2017   LDLCALC 129 (H) 01/02/2016   LDLDIRECT 129.0 02/09/2017   TRIG 299.0 (H) 02/09/2017   CHOLHDL 5 02/09/2017   No results found for: HGBA1C Lab Results  Component Value Date   VITAMINB12 983 02/07/2016   Lab Results  Component Value Date   TSH 1.630 09/08/2017    ASSESSMENT AND PLAN 73 y.o. year old female  has a past medical history of Abnormal EEG (01/14/12), Dermatomyositis ([facility]), GERD (gastroesophageal reflux disease), Glaucoma, Hypertension (2013), Memory loss, PMB (postmenopausal bleeding) (08/2003), Seizures ([facility]), Status post dilation of esophageal narrowing, Urinary incontinence, mixed (07/01/11), and Vitamin D deficiency disease. here with     ICD-10-CM   1. Seizure disorder (HCC)  G40.909   2. Memory disturbance  R41.3   3. Gait disturbance  R26.9 Ambulatory referral to Physical Therapy    April. Telford is doing very well from a seizure perspective.  She continues Keppra 250 mg twice daily.  No seizure activity has  been noted.  She continues Namenda 10 mg twice daily for memory loss.  MMSE today is 20.  In January MMSE was 21.  She and her husband both feel that she is fairly stable at home.  We will continue current therapy.  Gait is somewhat wide with exam today.  Tandem is unstable.  I have recommended a referral to physical therapy for further evaluation.  She was advised to consider walking with a cane.  Fall precautions discussed.  They will monitor blood pressures closely at home.  I have advised follow-up with primary care should readings exceed 140/90 consistently.  She will follow-up with me in 6 months, sooner if needed.  She and her husband verbalize understanding and agreement with this plan.   Orders Placed This Encounter  Procedures  . Ambulatory referral to Physical Therapy    Referral Priority:   Routine    Referral Type:   Physical  Medicine    Referral Reason:   Specialty Services Required    Requested Specialty:   Physical Therapy    Number of Visits Requested:   1     No orders of the defined types were placed in this encounter.     I spent 25 minutes with the patient. 50% of this time was spent counseling and educating patient on plan of care and medications.    Shawnie Dapper, FNP-C 11/23/2018, 3:24 PM [facility] [address] [place], [place] [address] [phone]

## 2018-11-23 ENCOUNTER — Ambulatory Visit: Payer: Medicare Other | Admitting: Family Medicine

## 2018-11-23 ENCOUNTER — Encounter: Payer: Self-pay | Admitting: Family Medicine

## 2018-11-23 ENCOUNTER — Other Ambulatory Visit: Payer: Self-pay

## 2018-11-23 VITALS — BP 151/77 | HR 68 | Temp 98.9°F | Ht 60.0 in | Wt 118.6 lb

## 2018-11-23 DIAGNOSIS — R269 Unspecified abnormalities of gait and mobility: Secondary | ICD-10-CM

## 2018-11-23 DIAGNOSIS — G40909 Epilepsy, unspecified, not intractable, without status epilepticus: Secondary | ICD-10-CM | POA: Diagnosis not present

## 2018-11-23 DIAGNOSIS — R413 Other amnesia: Secondary | ICD-10-CM | POA: Diagnosis not present

## 2018-11-23 NOTE — Patient Instructions (Addendum)
We will continue levetiracetam and Namenda as prescribed  Keep an eye on your BP at home, follow up with PCP if readings are > 140/90 consistently   Follow up in 6 months   Dementia Caregiver Guide Dementia is a term used to describe a number of symptoms that affect memory and thinking. The most common symptoms include:  Memory loss.  Trouble with language and communication.  Trouble concentrating.  Poor judgment.  Problems with reasoning.  Child-like behavior and language.  Extreme anxiety.  Angry outbursts.  Wandering from home or public places. Dementia usually gets worse slowly over time. In the early stages, people with dementia can stay independent and safe with some help. In later stages, they need help with daily tasks such as dressing, grooming, and using the bathroom. How to help the person with dementia cope Dementia can be frightening and confusing. Here are some tips to help the person with dementia cope with changes caused by the disease. General tips  Keep the person on track with his or her routine.  Try to identify areas where the person may need help.  Be supportive, patient, calm, and encouraging.  Gently remind the person that adjusting to changes takes time.  Help with the tasks that the person has asked for help with.  Keep the person involved in daily tasks and decisions as much as possible.  Encourage conversation, but try not to get frustrated or harried if the person struggles to find words or does not seem to appreciate your help. Communication tips  When the person is talking or seems frustrated, make eye contact and hold the person's hand.  Ask specific questions that need yes or no answers.  Use simple words, short sentences, and a calm voice. Only give one direction at a time.  When offering choices, limit them to just 1 or 2.  Avoid correcting the person in a negative way.  If the person is struggling to find the right words,  gently try to help him or her. How to recognize symptoms of stress Symptoms of stress in caregivers include:  Feeling frustrated or angry with the person with dementia.  Denying that the person has dementia or that his or her symptoms will not improve.  Feeling hopeless and unappreciated.  Difficulty sleeping.  Difficulty concentrating.  Feeling anxious, irritable, or depressed.  Developing stress-related health problems.  Feeling like you have too little time for your own life. Follow these instructions at home:   Make sure that you and the person you are caring for: ? Get regular sleep. ? Exercise regularly. ? Eat regular, nutritious meals. ? Drink enough fluid to keep your urine clear or pale yellow. ? Take over-the-counter and prescription medicines only as told by your health care providers. ? Attend all scheduled health care appointments.  Join a support group with others who are caregivers.  Ask about respite care resources so that you can have a regular break from the stress of caregiving.  Look for signs of stress in yourself and in the person you are caring for. If you notice signs of stress, take steps to manage it.  Consider any safety risks and take steps to avoid them.  Organize medications in a pill box for each day of the week.  Create a plan to handle any legal or financial matters. Get legal or financial advice if needed.  Keep a calendar in a central location to remind the person of appointments or other activities. Tips for reducing  the risk of injury  Keep floors clear of clutter. Remove rugs, magazine racks, and floor lamps.  Keep hallways well lit, especially at night.  Put a handrail and nonslip mat in the bathtub or shower.  Put childproof locks on cabinets that contain dangerous items, such as medicines, alcohol, guns, toxic cleaning items, sharp tools or utensils, matches, and lighters.  Put the locks in places where the person cannot  see or reach them easily. This will help ensure that the person does not wander out of the house and get lost.  Be prepared for emergencies. Keep a list of emergency phone numbers and addresses in a convenient area.  Remove car keys and lock garage doors so that the person does not try to get in the car and drive.  Have the person wear a bracelet that tracks locations and identifies the person as having memory problems. This should be worn at all times for safety. Where to find support: Many individuals and organizations offer support. These include:  Support groups for people with dementia and for caregivers.  Counselors or therapists.  Home health care services.  Adult day care centers. Where to find more information Alzheimer's Association: LimitLaws.hu Contact a health care provider if:  The person's health is rapidly getting worse.  You are no longer able to care for the person.  Caring for the person is affecting your physical and emotional health.  The person threatens himself or herself, you, or anyone else. Summary  Dementia is a term used to describe a number of symptoms that affect memory and thinking.  Dementia usually gets worse slowly over time.  Take steps to reduce the person's risk of injury, and to plan for future care.  Caregivers need support, relief from caregiving, and time for their own lives. This information is not intended to replace advice given to you by your health care provider. Make sure you discuss any questions you have with your health care provider. Document Released: 12/25/2015 Document Revised: 01/02/2017 Document Reviewed: 12/25/2015 Elsevier Patient Education  2020 [employer].   Memory Compensation Strategies  1. Use "WARM" strategy.  W= write it down  A= associate it  R= repeat it  M= make a mental note  2.   You can keep a Glass blower/designer.  Use a 3-ring notebook with sections for the following: calendar, important names and  phone numbers,  medications, doctors' names/phone numbers, lists/reminders, and a section to journal what you did  each day.   3.    Use a calendar to write appointments down.  4.    Write yourself a schedule for the day.  This can be placed on the calendar or in a separate section of the Memory Notebook.  Keeping a  regular schedule can help memory.  5.    Use medication organizer with sections for each day or morning/evening pills.  You may need help loading it  6.    Keep a basket, or pegboard by the door.  Place items that you need to take out with you in the basket or on the pegboard.  You may also want to  include a message board for reminders.  7.    Use sticky notes.  Place sticky notes with reminders in a place where the task is performed.  For example: " turn off the  stove" placed by the stove, "lock the door" placed on the door at eye level, " take your medications" on  the bathroom mirror or  by the place where you normally take your medications.  8.    Use alarms/timers.  Use while cooking to remind yourself to check on food or as a reminder to take your medicine, or as a  reminder to make a call, or as a reminder to perform another task, etc.

## 2018-11-23 NOTE — Progress Notes (Signed)
I have read the note, and I agree with the clinical assessment and plan.  Charles K Willis   

## 2018-12-16 ENCOUNTER — Other Ambulatory Visit: Payer: Self-pay | Admitting: Neurology

## 2018-12-16 DIAGNOSIS — G40909 Epilepsy, unspecified, not intractable, without status epilepticus: Secondary | ICD-10-CM

## 2018-12-27 ENCOUNTER — Ambulatory Visit: Payer: Medicare Other | Admitting: Neurology

## 2019-01-20 ENCOUNTER — Other Ambulatory Visit: Payer: Self-pay | Admitting: Nurse Practitioner

## 2019-01-20 ENCOUNTER — Other Ambulatory Visit: Payer: Self-pay | Admitting: Adult Health

## 2019-01-21 NOTE — Telephone Encounter (Signed)
April Chung stated that she is taking multivitamin with iron of 1000 iu daily. You still want me to send in Vit D3 400 unit once daily right? Quantity?    Bone density was order back in 02/2017 but the pt stated that when they went to [facility] to do it they told the pt that she just had it done a year ago and they can not do it too early.   Im trying to call [facility] to get a clarification of this bone density--

## 2019-01-21 NOTE — Telephone Encounter (Signed)
Please advise, do not see vit d level check for 2020 and last ov was 06/2018.

## 2019-01-21 NOTE — Telephone Encounter (Signed)
Last bone density indicates osteoporosis in hips (2017).  She is overdue for this screen. Please inquire if this can be ordered? Ok to refill vitamin d once daily. BID dose not needed if taken in combination with multivitamin

## 2019-01-21 NOTE — Telephone Encounter (Signed)
Clarification: do you mean with vitamin D 1000IU in multivitamin? If that is the case, she does not need additional 400IU. Let me know what you find about dexa scan

## 2019-01-25 NOTE — Telephone Encounter (Signed)
Tired to call [facility] twice [phone] but unable to leave vm.

## 2019-01-26 ENCOUNTER — Other Ambulatory Visit: Payer: Self-pay | Admitting: Nurse Practitioner

## 2019-01-26 DIAGNOSIS — M85852 Other specified disorders of bone density and structure, left thigh: Secondary | ICD-10-CM

## 2019-01-26 DIAGNOSIS — M85851 Other specified disorders of bone density and structure, right thigh: Secondary | ICD-10-CM

## 2019-01-26 DIAGNOSIS — E559 Vitamin D deficiency, unspecified: Secondary | ICD-10-CM

## 2019-01-26 NOTE — Progress Notes (Signed)
Pt is aware.  

## 2019-01-26 NOTE — Telephone Encounter (Signed)
Pt is aware.  

## 2019-01-26 NOTE — Progress Notes (Signed)
I entered another bone density to be done by [facility].

## 2019-01-26 NOTE — Telephone Encounter (Signed)
Unable to leave vm again.  

## 2019-01-26 NOTE — Telephone Encounter (Signed)
Cato Mulligan unable to get in touch with any one in the x-ray department at all. Do you just want me to put in another order for bone density? Not sure if it was a mistake on our end.   Mr. Calix stated 1000 unit within the multivitamin.

## 2019-01-27 ENCOUNTER — Other Ambulatory Visit: Payer: Self-pay | Admitting: Nurse Practitioner

## 2019-01-31 ENCOUNTER — Other Ambulatory Visit: Payer: Self-pay | Admitting: Nurse Practitioner

## 2019-02-21 ENCOUNTER — Other Ambulatory Visit: Payer: Self-pay | Admitting: Nurse Practitioner

## 2019-02-21 DIAGNOSIS — E559 Vitamin D deficiency, unspecified: Secondary | ICD-10-CM

## 2019-02-21 DIAGNOSIS — I10 Essential (primary) hypertension: Secondary | ICD-10-CM

## 2019-02-21 NOTE — Telephone Encounter (Signed)
Charlotte please advise, pt schedule for BP follow up 02/24/2019 virtual. Pt does not have BP cuff at home at the moment but might be able to obtain the reading at any pharmacy.   Please advise or you want me to send some in until pt receive 2nd covid vaccine in 03/15/2019? Pt has about 20 tab left for each med.

## 2019-02-23 ENCOUNTER — Other Ambulatory Visit: Payer: Self-pay | Admitting: Nurse Practitioner

## 2019-02-23 DIAGNOSIS — E559 Vitamin D deficiency, unspecified: Secondary | ICD-10-CM

## 2019-02-24 ENCOUNTER — Telehealth (INDEPENDENT_AMBULATORY_CARE_PROVIDER_SITE_OTHER): Payer: Medicare Other | Admitting: Nurse Practitioner

## 2019-02-24 ENCOUNTER — Other Ambulatory Visit: Payer: Self-pay

## 2019-02-24 ENCOUNTER — Encounter: Payer: Self-pay | Admitting: Nurse Practitioner

## 2019-02-24 VITALS — BP 114/73 | Temp 97.9°F | Ht 60.0 in | Wt 113.9 lb

## 2019-02-24 DIAGNOSIS — E7800 Pure hypercholesterolemia, unspecified: Secondary | ICD-10-CM | POA: Insufficient documentation

## 2019-02-24 DIAGNOSIS — E781 Pure hyperglyceridemia: Secondary | ICD-10-CM | POA: Diagnosis not present

## 2019-02-24 DIAGNOSIS — G5603 Carpal tunnel syndrome, bilateral upper limbs: Secondary | ICD-10-CM | POA: Insufficient documentation

## 2019-02-24 DIAGNOSIS — Z8669 Personal history of other diseases of the nervous system and sense organs: Secondary | ICD-10-CM | POA: Insufficient documentation

## 2019-02-24 DIAGNOSIS — G40909 Epilepsy, unspecified, not intractable, without status epilepticus: Secondary | ICD-10-CM | POA: Insufficient documentation

## 2019-02-24 DIAGNOSIS — I10 Essential (primary) hypertension: Secondary | ICD-10-CM

## 2019-02-24 DIAGNOSIS — F3342 Major depressive disorder, recurrent, in full remission: Secondary | ICD-10-CM

## 2019-02-24 DIAGNOSIS — E559 Vitamin D deficiency, unspecified: Secondary | ICD-10-CM | POA: Diagnosis not present

## 2019-02-24 MED ORDER — CALCIUM CARBONATE 600 MG PO TABS: 600.0000 mg | ORAL_TABLET | Freq: Every day | ORAL | 3 refills | Status: DC

## 2019-02-24 MED ORDER — CITALOPRAM HYDROBROMIDE 20 MG PO TABS: 20.0000 mg | ORAL_TABLET | Freq: Every day | ORAL | 3 refills | Status: DC

## 2019-02-24 MED ORDER — LOSARTAN POTASSIUM 50 MG PO TABS: 50.0000 mg | ORAL_TABLET | Freq: Every day | ORAL | 3 refills | Status: DC

## 2019-02-24 MED ORDER — AMLODIPINE BESYLATE 2.5 MG PO TABS: 2.5000 mg | ORAL_TABLET | Freq: Every day | ORAL | 3 refills | Status: DC

## 2019-02-24 NOTE — Assessment & Plan Note (Signed)
Last dexa scan 2017 indicated osteopenia. Current use of calcium and vitamin D Ordered repeat dexa scan

## 2019-02-24 NOTE — Assessment & Plan Note (Addendum)
BP at goal with amlodipine and losartan BP Readings from Last 3 Encounters:  02/24/19 114/73  11/23/18 (!) 151/77  02/11/18 126/86   F/up in 37months

## 2019-02-24 NOTE — Assessment & Plan Note (Signed)
Trig of 299 Current use of fish oil 2g BID, heart healthy diet and daily exercise per husband. Repeat lipid panel, TSh and hepatic panel

## 2019-02-24 NOTE — Assessment & Plan Note (Signed)
Stable mood per husband. Current use of citalopram.

## 2019-02-24 NOTE — Progress Notes (Signed)
Interactive audio and video telecommunications were attempted between this provider and patient, however failed, due to patient having technical difficulties OR patient did not have access to video capability.  We continued and completed visit with audio only.   Virtual Visit via Video Note  I connected with@ on 02/24/19 at  1:00 PM EST by a video enabled telemedicine application and verified that I am speaking with the correct person using two identifiers.  Location: Patient:Home Provider: Office Participants: patient, husband and provider   I discussed the limitations of evaluation and management by telemedicine and the availability of in person appointments. I also discussed with the patient that there may be a patient responsible charge related to this service. The patient expressed understanding and agreed to proceed.  CC:HTN f/up and medication refill  History of Present Illness: Most of information provided by Mr. Catalina. Medications managed by husband  HTN: Stable BP with amlodipine and losartan. No LE edema, no headache, no dizziness, no palpitations, no CP, no cough BP Readings from Last 3 Encounters:  02/24/19 114/73  11/23/18 (!) 151/77  02/11/18 126/86   Depression: Stable mood per husband Current use of citalopram Last appt with neurology 11/2018, denies seizure activity She is independent with personal ADLs, but does not drive or cook. Denies any falls or change in gait.   Observations/Objective: Limited due to telephone call  Assessment and Plan: Colleena was seen today for follow-up.  Diagnoses and all orders for this visit:  Essential hypertension -     CBC; Future -     Basic metabolic panel; Future -     amLODipine (NORVASC) 2.5 MG tablet; Take 1 tablet (2.5 mg total) by mouth daily. -     losartan (COZAAR) 50 MG tablet; Take 1 tablet (50 mg total) by mouth daily.  Vitamin D deficiency -     calcium carbonate (GNP CALCIUM) 600 MG TABS tablet; Take 1  tablet (600 mg total) by mouth daily with breakfast.  Hypertriglyceridemia -     Hepatic function panel; Future -     Lipid panel; Future -     TSH; Future  Recurrent major depressive disorder, in full remission (HCC) -     citalopram (CELEXA) 20 MG tablet; Take 1 tablet (20 mg total) by mouth daily.    Follow Up Instructions: See avs   I discussed the assessment and treatment plan with the patient. The patient was provided an opportunity to ask questions and all were answered. The patient agreed with the plan and demonstrated an understanding of the instructions.   The patient was advised to call back or seek an in-person evaluation if the symptoms worsen or if the condition fails to improve as anticipated.  I provided 15 minutes of non-face-to-face time during this encounter.   Alysia Penna, NP

## 2019-03-02 ENCOUNTER — Other Ambulatory Visit: Payer: Self-pay

## 2019-03-03 ENCOUNTER — Other Ambulatory Visit (INDEPENDENT_AMBULATORY_CARE_PROVIDER_SITE_OTHER): Payer: Medicare Other

## 2019-03-03 DIAGNOSIS — I10 Essential (primary) hypertension: Secondary | ICD-10-CM

## 2019-03-03 DIAGNOSIS — E781 Pure hyperglyceridemia: Secondary | ICD-10-CM

## 2019-03-03 LAB — CBC
HCT: 39.4 % (ref 36.0–46.0)
Hemoglobin: 13.6 g/dL (ref 12.0–15.0)
MCHC: 34.4 g/dL (ref 30.0–36.0)
MCV: 94.2 fl (ref 78.0–100.0)
Platelets: 271 10*3/uL (ref 150.0–400.0)
RBC: 4.18 Mil/uL (ref 3.87–5.11)
RDW: 12.7 % (ref 11.5–15.5)
WBC: 9.2 10*3/uL (ref 4.0–10.5)

## 2019-03-03 LAB — TSH: TSH: 1.73 u[IU]/mL (ref 0.35–4.50)

## 2019-03-03 LAB — BASIC METABOLIC PANEL
BUN: 25 mg/dL — ABNORMAL HIGH (ref 6–23)
CO2: 31 mEq/L (ref 19–32)
Calcium: 9.6 mg/dL (ref 8.4–10.5)
Chloride: 103 mEq/L (ref 96–112)
Creatinine, Ser: 0.66 mg/dL (ref 0.40–1.20)
GFR: 87.08 mL/min (ref >60.00)
Glucose, Bld: 85 mg/dL (ref 70–99)
Potassium: 5 mEq/L (ref 3.5–5.1)
Sodium: 138 mEq/L (ref 135–145)

## 2019-03-03 LAB — LIPID PANEL
Cholesterol: 217 mg/dL — ABNORMAL HIGH (ref 0–200)
HDL: 51.8 mg/dL (ref >39.00)
LDL Cholesterol: 126 mg/dL — ABNORMAL HIGH (ref 0–99)
NonHDL: 165.49
Total CHOL/HDL Ratio: 4
Triglycerides: 195 mg/dL — ABNORMAL HIGH (ref 0.0–149.0)
VLDL: 39 mg/dL (ref 0.0–40.0)

## 2019-03-03 LAB — HEPATIC FUNCTION PANEL
ALT: 19 U/L (ref 0–35)
AST: 24 U/L (ref 0–37)
Albumin: 4.3 g/dL (ref 3.5–5.2)
Alkaline Phosphatase: 74 U/L (ref 39–117)
Bilirubin, Direct: 0.1 mg/dL (ref 0.0–0.3)
Total Bilirubin: 0.6 mg/dL (ref 0.2–1.2)
Total Protein: 7.4 g/dL (ref 6.0–8.3)

## 2019-03-05 ENCOUNTER — Telehealth: Payer: Self-pay | Admitting: Nurse Practitioner

## 2019-03-05 DIAGNOSIS — E782 Mixed hyperlipidemia: Secondary | ICD-10-CM

## 2019-03-05 MED ORDER — PRAVASTATIN SODIUM 20 MG PO TABS: 20.0000 mg | ORAL_TABLET | Freq: Every day | ORAL | 3 refills | Status: DC

## 2019-03-05 NOTE — Telephone Encounter (Signed)
-----   Message from Livingston Diones, LPN sent at 09/30/32  4:43 PM EST ----- Pt verbalize understand of test result.   Pt agree with the low dose statin. Please send to pharmacy.

## 2019-03-21 ENCOUNTER — Other Ambulatory Visit: Payer: Self-pay | Admitting: Nurse Practitioner

## 2019-03-21 DIAGNOSIS — F3342 Major depressive disorder, recurrent, in full remission: Secondary | ICD-10-CM

## 2019-04-26 ENCOUNTER — Other Ambulatory Visit: Payer: Self-pay

## 2019-04-26 ENCOUNTER — Ambulatory Visit
Admission: RE | Admit: 2019-04-26 | Discharge: 2019-04-26 | Disposition: A | Discharge status: Home / Self Care | Payer: Medicare Other | Source: Ambulatory Visit | Attending: Nurse Practitioner | Admitting: Nurse Practitioner

## 2019-04-26 DIAGNOSIS — M8589 Other specified disorders of bone density and structure, multiple sites: Secondary | ICD-10-CM | POA: Diagnosis not present

## 2019-04-26 DIAGNOSIS — M85851 Other specified disorders of bone density and structure, right thigh: Secondary | ICD-10-CM

## 2019-04-26 DIAGNOSIS — Z780 Asymptomatic menopausal state: Secondary | ICD-10-CM | POA: Diagnosis not present

## 2019-04-26 DIAGNOSIS — E559 Vitamin D deficiency, unspecified: Secondary | ICD-10-CM

## 2019-06-16 ENCOUNTER — Other Ambulatory Visit: Payer: Self-pay | Admitting: *Deleted

## 2019-06-16 MED ORDER — MEMANTINE HCL 10 MG PO TABS: 10.0000 mg | ORAL_TABLET | Freq: Two times a day (BID) | ORAL | 5 refills | Status: DC

## 2019-07-06 ENCOUNTER — Ambulatory Visit: Payer: Medicare Other | Admitting: *Deleted

## 2019-08-25 ENCOUNTER — Ambulatory Visit (INDEPENDENT_AMBULATORY_CARE_PROVIDER_SITE_OTHER): Payer: Medicare Other

## 2019-08-25 VITALS — Ht 60.0 in | Wt 108.0 lb

## 2019-08-25 DIAGNOSIS — Z0000 Encounter for general adult medical examination without abnormal findings: Secondary | ICD-10-CM | POA: Diagnosis not present

## 2019-08-25 NOTE — Progress Notes (Signed)
Subjective:   April Chung is a 74 y.o. female who presents for Medicare Annual (Subsequent) preventive examination.  I connected with Marcheta today by telephone and verified that I am speaking with the correct person using two identifiers. Location patient: home Location provider: work Persons participating in the virtual visit: patient, nurse, husband   I discussed the limitations, risks, security and privacy concerns of performing an evaluation and management service by telephone and the availability of in person appointments. I also discussed with the patient that there may be a patient responsible charge related to this service. The patient expressed understanding and verbally consented to this telephonic visit.    Interactive audio and video telecommunications were attempted between this provider and patient, however failed, due to patient having technical difficulties OR patient did not have access to video capability.  We continued and completed visit with audio only.  Some vital signs may be absent or patient reported.   Time Spent with patient on telephone encounter: 25 minutes  Review of Systems     Cardiac Risk Factors include: advanced age (>35men, >83 women);hypertension     Objective:    Today's Vitals   08/25/19 1415  Weight: 108 lb (49 kg)  Height: 5' (1.524 m)   Body mass index is 21.09 kg/m.  Advanced Directives 08/25/2019 03/25/2017 07/12/2016 08/02/2014  Does Patient Have a Medical Advance Directive? No;Yes Yes No Yes  Type of Estate agent of [place];Living will Healthcare Power of [place];Living will - Healthcare Power of [place];Living will  Copy of Healthcare Power of Attorney in Chart? No - copy requested No - copy requested - No - copy requested  Would patient like information on creating a medical advance directive? - - No - Patient declined -    Current Medications (verified) Outpatient Encounter Medications as of 08/25/2019    Medication Sig  . amLODipine (NORVASC) 2.5 MG tablet Take 1 tablet (2.5 mg total) by mouth daily.  Marland Kitchen aspirin 81 MG tablet Take 81 mg by mouth daily.  . brimonidine (ALPHAGAN) 0.15 % ophthalmic solution 1 drop 2 (two) times daily.  . calcium carbonate (GNP CALCIUM) 600 MG TABS tablet Take 1 tablet (600 mg total) by mouth daily with breakfast.  . citalopram (CELEXA) 20 MG tablet Take 1 tablet (20 mg total) by mouth daily.  . ergocalciferol (VITAMIN D2) 1.25 MG (50000 UT) capsule Take by mouth.  . fish oil-omega-3 fatty acids 1000 MG capsule Take 2 g by mouth 2 (two) times daily after a meal.  . levETIRAcetam (KEPPRA) 250 MG tablet TAKE 1 TABLET BY MOUTH 2 TIMES DAILY  . losartan (COZAAR) 50 MG tablet Take 1 tablet (50 mg total) by mouth daily.  . memantine (NAMENDA) 10 MG tablet Take 1 tablet (10 mg total) by mouth 2 (two) times daily.  . Multiple Vitamins-Calcium (ONE-A-DAY WOMENS PO) Take by mouth daily.  . pravastatin (PRAVACHOL) 20 MG tablet Take 1 tablet (20 mg total) by mouth at bedtime.  . XELPROS 0.005 % EMUL Apply 1 drop to eye daily.   No facility-administered encounter medications on file as of 08/25/2019.    Allergies (verified) Aricept [donepezil hcl], Plaquenil [hydroxychloroquine sulfate], and Hydroxychloroquine   History: Past Medical History:  Diagnosis Date  . Abnormal EEG 01/14/12   started on Keppra  . Dermatomyositis (HCC)    remission on pred until 2008, rheum at [facility]  . GERD (gastroesophageal reflux disease)    agravated with Fosamax  . Glaucoma   .  Hypertension 2013  . Memory loss   . PMB (postmenopausal bleeding) 08/2003  . Seizures (HCC)   . Status post dilation of esophageal narrowing   . Urinary incontinence, mixed 07/01/11   Fitted for 2.75 " Ring Pessary with support  . Vitamin D deficiency disease    Past Surgical History:  Procedure Laterality Date  . COLONOSCOPY  09/25/04   Dr. Loreta Ave  . COLONOSCOPY  02/2009  . ENDOMETRIAL BIOPSY   08/24/03   weak proliferative endo, [facility] neg.  Marland Kitchen ESOPHAGOGASTRODUODENOSCOPY ENDOSCOPY  04/29/04   anemia without cause  . NO PAST SURGERIES     Family History  Problem Relation Age of Onset  . Dementia Father        died age 3, otherwise healthy  . Hypertension Mother   . Heart disease Maternal Grandfather   . Heart disease Maternal Grandmother   . Seizures Neg Hx    Social History   Socioeconomic History  . Marital status: Married    Spouse name: [name]  . Number of children: 1  . Years of education: hs  . Highest education level: Not on file  Occupational History  . Occupation: housewife  Tobacco Use  . Smoking status: Former Smoker    Packs/day: 1.00    Years: 15.00    Pack years: 15.00    Types: Cigarettes    Quit date: 02/04/1976    Years since quitting: 43.5  . Smokeless tobacco: Never Used  Vaping Use  . Vaping Use: Never used  Substance and Sexual Activity  . Alcohol use: Yes    Alcohol/week: 7.0 standard drinks    Types: 7 Glasses of wine per week    Comment: 1 glass of wine per day  . Drug use: No  . Sexual activity: Never    Partners: Male    Birth control/protection: Post-menopausal  Other Topics Concern  . Not on file  Social History Narrative   Married, lives with spouse [name]).   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   Patient has one child.   Patient is right-handed.   Patient drinks 2-3 cups of coffee daily and maybe half cup at night.               Social Determinants of Health   Financial Resource Strain: Low Risk   . Difficulty of Paying Living Expenses: Not hard at all  Food Insecurity:   . Worried About Programme researcher, broadcasting/film/video in the Last Year:   . Barista in the Last Year:   Transportation Needs: No Transportation Needs  . Lack of Transportation (Medical): No  . Lack of Transportation (Non-Medical): No  Physical Activity: Insufficiently Active  . Days of  Exercise per Week: 3 days  . Minutes of Exercise per Session: 10 min  Stress:   . Feeling of Stress :   Social Connections: Moderately Integrated  . Frequency of Communication with Friends and Family: More than three times a week  . Frequency of Social Gatherings with Friends and Family: More than three times a week  . Attends Religious Services: Never  . Active Member of Clubs or Organizations: Yes  . Attends Banker Meetings: Never  . Marital Status: Married    Tobacco Counseling Counseling given: Not Answered   Clinical Intake:  Pre-visit preparation completed: Yes  Pain : No/denies pain     Nutritional Status: BMI of 19-24  Normal  Nutritional Risks: None Diabetes: No  How often do you need to have someone help you when you read instructions, pamphlets, or other written materials from your doctor or pharmacy?: 1 - Never What is the last grade level you completed in school?: some college  Diabetic?No     Information entered by :: Thomasenia Sales LPN   Activities of Daily Living In your present state of health, do you have any difficulty performing the following activities: 08/25/2019  Hearing? N  Vision? N  Difficulty concentrating or making decisions? Y  Comment dementia  Walking or climbing stairs? N  Dressing or bathing? N  Doing errands, shopping? Y  Comment husband Ship broker and eating ? N  Using the Toilet? N  In the past six months, have you accidently leaked urine? N  Do you have problems with loss of bowel control? N  Managing your Medications? N  Managing your Finances? N  Housekeeping or managing your Housekeeping? N  Some recent data might be hidden    Patient Care Team: Nche, Bonna Gains, NP as PCP - General (Internal Medicine) Romine, Edwena Felty, MD (Obstetrics and Gynecology) [place], Heather, [place] (Optometry) Hilarie Fredrickson, MD (Gastroenterology) York Spaniel, MD (Neurology)  Indicate any recent Medical  Services you may have received from other than [facility] providers in the past year (date may be approximate).     Assessment:   This is a routine wellness examination for Cassadi.  Hearing/Vision screen  Hearing Screening   125Hz  250Hz  500Hz  1000Hz  2000Hz  3000Hz  4000Hz  6000Hz  8000Hz   Right ear:           Left ear:           Comments: No issues  Vision Screening Comments: Last eye exam-04/2019-Dr.  Dietary issues and exercise activities discussed: Current Exercise Habits: Home exercise routine, Type of exercise: walking, Time (Minutes): 10, Frequency (Times/Week): 4, Weekly Exercise (Minutes/Week): 40, Intensity: Mild  Goals    . Maintain current active lifestyle.      Depression Screen PHQ 2/9 Scores 08/25/2019 02/24/2019 06/08/2018 03/25/2017 02/09/2017 02/05/2016 12/13/2013  PHQ - 2 Score 0 0 0 0 0 0 1  PHQ- 9 Score - - - - 0 - -    Fall Risk Fall Risk  08/25/2019 02/24/2019 06/08/2018 02/11/2018 03/25/2017  Falls in the past year? 0 0 1 0 No  Number falls in past yr: 0 - 0 - -  Injury with Fall? 0 - 0 - -  Follow up Falls prevention discussed - - - -    Any stairs in or around the home? Yes If so, are there any without handrails? No  Home free of loose throw rugs in walkways, pet beds, electrical cords, etc? Yes  Adequate lighting in your home to reduce risk of falls? Yes   ASSISTIVE DEVICES UTILIZED TO PREVENT FALLS:  Life alert? No  Use of a cane, walker or w/c? No  Grab bars in the bathroom? Yes  Shower chair or bench in shower? No  Elevated toilet seat or a handicapped toilet? No   TIMED UP AND GO:  Was the test performed? No .    Cognitive Function:Unable to perform MMSE or 6CIT due to Dementia. Husband assisted with answering questions for today's visit. MMSE - Mini Mental State Exam 11/23/2018 02/11/2018 03/25/2017 02/10/2017 07/28/2016  Orientation to time 2 2 5 5 3   Orientation to Place 3 3 5 5 5   Registration 3 3 3 3 3   Attention/  Calculation Recall 1 2 0 3 3   Language- name 2 objects Language- repeat 1 0 Language- follow 3 step command Language- read & follow direction Write a sentence Copy design 1 0 Copy design-comments named 5 animals - - - -  Total score Immunizations Immunization History  Administered Date(s) Administered  . Fluad Quad(high Dose 65+) 11/03/2018  . Influenza Split 11/04/2010  . Influenza, High Dose Seasonal PF 11/08/2016  . Influenza-Unspecified 11/17/2013, 12/04/2017  . Moderna SARS-COVID-2 Vaccination 03/07/2019, 03/28/2019  . Pneumococcal Conjugate-13 12/13/2013  . Pneumococcal-Unspecified 11/12/2011  . Tdap 08/24/2012    TDAP status: Up to date   Flu Vaccine status: Up to date   Pneumococcal vaccine status: Up to date   Covid-19 vaccine status: Completed vaccines  Qualifies for Shingles Vaccine? Yes   Zostavax completed No   Shingrix Completed?: No.    Education has been provided regarding the importance of this vaccine. Patient has been advised to call insurance company to determine out of pocket expense if they have not yet received this vaccine. Advised may also receive vaccine at local pharmacy or Health Dept. Verbalized acceptance and understanding.  Screening Tests Health Maintenance  Topic Date Due  . INFLUENZA VACCINE  09/04/2019  . TETANUS/TDAP  08/25/2022  . DEXA SCAN  Completed  . COVID-19 Vaccine  Completed  . Hepatitis C Screening  Completed  . PNA vac Low Risk Adult  Completed    Health Maintenance  There are no preventive care reminders to display for this patient.  Colorectal cancer screening: No longer required.    Mammogram status: No longer required.    Bone Density status: Completed 04/26/2019. Results reflect: Bone density results: OSTEOPENIA. Repeat every 2 years.  Lung Cancer Screening: (Low Dose CT Chest recommended if Age 78-80 years, 30 pack-year currently smoking OR have quit w/in  15years.) does not qualify.     Additional Screening:  Hepatitis C Screening:Completed 01/01/2015  Vision Screening: Recommended annual ophthalmology exams for early detection of glaucoma and other disorders of the eye. Is the patient up to date with their annual eye exam?  No  Who is the provider or what is the name of the office in which the patient attends annual eye exams? Dr. [place]   Dental Screening: Recommended annual dental exams for proper oral hygiene  Community Resource Referral / Chronic Care Management: CRR required this visit?  No   CCM required this visit?  No      Plan:     I have personally reviewed and noted the following in the patient's chart:   . Medical and social history . Use of alcohol, tobacco or illicit drugs  . Current medications and supplements . Functional ability and status . Nutritional status . Physical activity . Advanced directives . List of other physicians . Hospitalizations, surgeries, and ER visits in previous 12 months . Vitals . Screenings to include cognitive, depression, and falls . Referrals and appointments  In addition, I have reviewed and discussed with patient certain preventive protocols, quality metrics, and best practice recommendations. A written personalized care plan for preventive services as well as general preventive health recommendations were provided to patient.  Due to this being a telephonic visit,  the after visit summary with patients personalized plan was offered to patient via mail or my-chart. Patient would like to access on my-chart.     Roanna RaiderMartha A Licia Harl, LPN   1/61/09607/22/2021  Nurse Health Advisor  Nurse Notes: None

## 2019-08-25 NOTE — Patient Instructions (Signed)
April Chung , Thank you for taking time to come for your Medicare Wellness Visit. I appreciate your ongoing commitment to your health goals. Please review the following plan we discussed and let me know if I can assist you in the future.   Screening recommendations/referrals: Colonoscopy: No longer indicated Mammogram: Declined at this time Bone Density: Completed 04/26/2019- Due 04/25/2021 Recommended yearly ophthalmology/optometry visit for glaucoma screening and checkup Recommended yearly dental visit for hygiene and checkup  Vaccinations: Influenza vaccine:  Up To Date - Due 10/2019 Pneumococcal vaccine: Completed vaccines Tdap vaccine: Up to Date-Due-08/25/2022 Shingles vaccine: Discuss wih pharmacy   Covid-19:Vaccines completed  Advanced directives: Please bring a copy to your next office visit for your chart.  Conditions/risks identified: See problem list  Next appointment: Follow up in one year for your annual wellness visit    Preventive Care 65 Years and Older, Female Preventive care refers to lifestyle choices and visits with your health care provider that can promote health and wellness. What does preventive care include?  A yearly physical exam. This is also called an annual well check.  Dental exams once or twice a year.  Routine eye exams. Ask your health care provider how often you should have your eyes checked.  Personal lifestyle choices, including:  Daily care of your teeth and gums.  Regular physical activity.  Eating a healthy diet.  Avoiding tobacco and drug use.  Limiting alcohol use.  Practicing safe sex.  Taking low-dose aspirin every day.  Taking vitamin and mineral supplements as recommended by your health care provider. What happens during an annual well check? The services and screenings done by your health care provider during your annual well check will depend on your age, overall health, lifestyle risk factors, and family history of  disease. Counseling  Your health care provider may ask you questions about your:  Alcohol use.  Tobacco use.  Drug use.  Emotional well-being.  Home and relationship well-being.  Sexual activity.  Eating habits.  History of falls.  Memory and ability to understand (cognition).  Work and work Astronomer.  Reproductive health. Screening  You may have the following tests or measurements:  Height, weight, and BMI.  Blood pressure.  Lipid and cholesterol levels. These may be checked every 5 years, or more frequently if you are over 49 years old.  Skin check.  Lung cancer screening. You may have this screening every year starting at age 44 if you have a 30-pack-year history of smoking and currently smoke or have quit within the past 15 years.  Fecal occult blood test (FOBT) of the stool. You may have this test every year starting at age 59.  Flexible sigmoidoscopy or colonoscopy. You may have a sigmoidoscopy every 5 years or a colonoscopy every 10 years starting at age 31.  Hepatitis C blood test.  Hepatitis B blood test.  Sexually transmitted disease (STD) testing.  Diabetes screening. This is done by checking your blood sugar (glucose) after you have not eaten for a while (fasting). You may have this done every 1-3 years.  Bone density scan. This is done to screen for osteoporosis. You may have this done starting at age 59.  Mammogram. This may be done every 1-2 years. Talk to your health care provider about how often you should have regular mammograms. Talk with your health care provider about your test results, treatment options, and if necessary, the need for more tests. Vaccines  Your health care provider may recommend certain vaccines, such  as:  Influenza vaccine. This is recommended every year.  Tetanus, diphtheria, and acellular pertussis (Tdap, Td) vaccine. You may need a Td booster every 10 years.  Zoster vaccine. You may need this after age  22.  Pneumococcal 13-valent conjugate (PCV13) vaccine. One dose is recommended after age 10.  Pneumococcal polysaccharide (PPSV23) vaccine. One dose is recommended after age 84. Talk to your health care provider about which screenings and vaccines you need and how often you need them. This information is not intended to replace advice given to you by your health care provider. Make sure you discuss any questions you have with your health care provider. Document Released: 02/16/2015 Document Revised: 10/10/2015 Document Reviewed: 11/21/2014 Elsevier Interactive Patient Education  2017 [employer] Prevention in the Home Falls can cause injuries. They can happen to people of all ages. There are many things you can do to make your home safe and to help prevent falls. What can I do on the outside of my home?  Regularly fix the edges of walkways and driveways and fix any cracks.  Remove anything that might make you trip as you walk through a door, such as a raised step or threshold.  Trim any bushes or trees on the path to your home.  Use bright outdoor lighting.  Clear any walking paths of anything that might make someone trip, such as rocks or tools.  Regularly check to see if handrails are loose or broken. Make sure that both sides of any steps have handrails.  Any raised decks and porches should have guardrails on the edges.  Have any leaves, snow, or ice cleared regularly.  Use sand or salt on walking paths during winter.  Clean up any spills in your garage right away. This includes oil or grease spills. What can I do in the bathroom?  Use night lights.  Install grab bars by the toilet and in the tub and shower. Do not use towel bars as grab bars.  Use non-skid mats or decals in the tub or shower.  If you need to sit down in the shower, use a plastic, non-slip stool.  Keep the floor dry. Clean up any water that spills on the floor as soon as it happens.  Remove  soap buildup in the tub or shower regularly.  Attach bath mats securely with double-sided non-slip rug tape.  Do not have throw rugs and other things on the floor that can make you trip. What can I do in the bedroom?  Use night lights.  Make sure that you have a light by your bed that is easy to reach.  Do not use any sheets or blankets that are too big for your bed. They should not hang down onto the floor.  Have a firm chair that has side arms. You can use this for support while you get dressed.  Do not have throw rugs and other things on the floor that can make you trip. What can I do in the kitchen?  Clean up any spills right away.  Avoid walking on wet floors.  Keep items that you use a lot in easy-to-reach places.  If you need to reach something above you, use a strong step stool that has a grab bar.  Keep electrical cords out of the way.  Do not use floor polish or wax that makes floors slippery. If you must use wax, use non-skid floor wax.  Do not have throw rugs and other things on the  floor that can make you trip. What can I do with my stairs?  Do not leave any items on the stairs.  Make sure that there are handrails on both sides of the stairs and use them. Fix handrails that are broken or loose. Make sure that handrails are as long as the stairways.  Check any carpeting to make sure that it is firmly attached to the stairs. Fix any carpet that is loose or worn.  Avoid having throw rugs at the top or bottom of the stairs. If you do have throw rugs, attach them to the floor with carpet tape.  Make sure that you have a light switch at the top of the stairs and the bottom of the stairs. If you do not have them, ask someone to add them for you. What else can I do to help prevent falls?  Wear shoes that:  Do not have high heels.  Have rubber bottoms.  Are comfortable and fit you well.  Are closed at the toe. Do not wear sandals.  If you use a  stepladder:  Make sure that it is fully opened. Do not climb a closed stepladder.  Make sure that both sides of the stepladder are locked into place.  Ask someone to hold it for you, if possible.  Clearly mark and make sure that you can see:  Any grab bars or handrails.  First and last steps.  Where the edge of each step is.  Use tools that help you move around (mobility aids) if they are needed. These include:  Canes.  Walkers.  Scooters.  Crutches.  Turn on the lights when you go into a dark area. Replace any light bulbs as soon as they burn out.  Set up your furniture so you have a clear path. Avoid moving your furniture around.  If any of your floors are uneven, fix them.  If there are any pets around you, be aware of where they are.  Review your medicines with your doctor. Some medicines can make you feel dizzy. This can increase your chance of falling. Ask your doctor what other things that you can do to help prevent falls. This information is not intended to replace advice given to you by your health care provider. Make sure you discuss any questions you have with your health care provider. Document Released: 11/16/2008 Document Revised: 06/28/2015 Document Reviewed: 02/24/2014 Elsevier Interactive Patient Education  2017 [employer].

## 2019-11-15 DIAGNOSIS — H401132 Primary open-angle glaucoma, bilateral, moderate stage: Secondary | ICD-10-CM | POA: Diagnosis not present

## 2019-11-29 ENCOUNTER — Other Ambulatory Visit: Payer: Self-pay | Admitting: *Deleted

## 2019-11-29 DIAGNOSIS — G40909 Epilepsy, unspecified, not intractable, without status epilepticus: Secondary | ICD-10-CM

## 2019-11-29 MED ORDER — LEVETIRACETAM 250 MG PO TABS: 250.0000 mg | ORAL_TABLET | Freq: Two times a day (BID) | ORAL | 3 refills | Status: DC

## 2019-11-29 NOTE — Telephone Encounter (Signed)
Needs appt for f/u

## 2019-12-21 ENCOUNTER — Encounter: Payer: Self-pay | Admitting: Family Medicine

## 2019-12-21 ENCOUNTER — Other Ambulatory Visit: Payer: Self-pay

## 2019-12-21 ENCOUNTER — Ambulatory Visit: Payer: Medicare Other | Admitting: Family Medicine

## 2019-12-21 VITALS — BP 151/70 | HR 53 | Ht 59.0 in | Wt 108.0 lb

## 2019-12-21 DIAGNOSIS — R413 Other amnesia: Secondary | ICD-10-CM | POA: Diagnosis not present

## 2019-12-21 DIAGNOSIS — G40909 Epilepsy, unspecified, not intractable, without status epilepticus: Secondary | ICD-10-CM

## 2019-12-21 NOTE — Progress Notes (Signed)
Chief Complaint  Patient presents with  . Follow-up    rm 1  . Seizures    Pt here for a f/u in seizures.     HISTORY OF PRESENT ILLNESS: Today 12/21/19  April Chung is a 74 y.o. female here today for follow up for seizures and memory loss. She continues levetiracetam  BID and Namenda  BID. She presents with her husband who aids in history. She feels she is doing well and husband agrees. She continues to be fairly independent. Mr Rancourt helps with meal preparation and medications. She is able to perform ADL's. She is less active than she used to be but continues to walk daily. No seizure activity.    HISTORY (copied from previous note)  April Chung is a 74 y.o. female here today for follow up of seizure ans memory loss. She continues Keppra  twice daily. She is also taking Namenda  twice daily. She was unable to tolerate Aricept due to low heart rate.  Overall, she feels that she is doing very well.  Her husband is with her today and agrees.  They feel that memory is fairly stable.  She is able to perform ADLs independently.  She does not drive.  She is not as active as she used to be.  Her husband mentions that walking has become much slower and pace.  He states that she seems stable in balance but moves her feet much slower than she used to.  He denies shuffling or stooped posture.  He has considered having her to use a walking stick but is concerned that she will not use it.  There have been no falls.  No strokelike symptoms.  No seizure activity.  She and her husband deny concerns of elevated blood pressures at home.  They admit that they do not routinely check.  Blood pressure was normal in the office with Megan in January/2020.  Initial blood pressure reading in office today was 182/100.  At completion of visit we recheck blood pressure and it is 151/77.  Her husband feels that normal blood pressure readings are 130s over 70s to 80s.  She is followed by primary care  for hypertension.  She is currently taking Cozaar 50 mg daily as well as Norvasc 2.5 mg daily.  She denies headaches, dizziness, chest pain or trouble breathing.  HISTORY: (copied from [place] note on 02/11/2018)  April Chung is a 74 year old right handed female who presents today for follow up regarding seizures and memory loss. She is taking Namenda  twice daily. She had to stop Aricept due to decreased heart rate. Mr Gaal states that he has not been able to tell much of a difference since stopping Aricept with the exception of slower movements. She is tolerating Namenda well. MMSE today is 21/30. In 03/2017 she scored 27/30. She continues to perform all ADL's independently. Mr Atilano works during the day and she is doing well staying home alone. He does assist with dosing medications.She has become reclusive and typically does not like to go out much per her husband.  She is taking Keppra  twice daily. She denies seizure like activity. She is tolerating Keppra well without excessive drowsiness.  HISTORY: (copied from Dr Anne Hahn' note from 02/10/2017)  April Chung is a 74 year old right-handed white female with a history of a mild memory disturbance and history of seizures. The patient is on Aricept taking 10 mg daily, she is on Namenda taking 10 mg  twice daily. She tolerates these medications well. She is on low-dose Keppra, she tolerates this drug also without drowsiness. She has not had any recurring seizures since last seen, she does operate a motor vehicle without difficulty. The patient has not noted any progression of balance issues, she denies any progression of memory. She returns to this office for an evaluation    REVIEW OF SYSTEMS: Out of a complete 14 system review of symptoms, the patient complains only of the following symptoms, memory loss and all other reviewed systems are negative.   ALLERGIES: Allergies  Allergen Reactions  . Aricept [Donepezil Hcl] Other  (See Comments)    Low heart rate, do not take per cardiologist  . Plaquenil [Hydroxychloroquine Sulfate]   . Hydroxychloroquine Rash     HOME MEDICATIONS: Outpatient Medications Prior to Visit  Medication Sig Dispense Refill  . amLODipine (NORVASC) 2.5 MG tablet Take 1 tablet (2.5 mg total) by mouth daily. 90 tablet 3  . aspirin 81 MG tablet Take 81 mg by mouth daily.    . brimonidine (ALPHAGAN) 0.15 % ophthalmic solution 1 drop 2 (two) times daily.    . calcium carbonate (GNP CALCIUM) 600 MG TABS tablet Take 1 tablet (600 mg total) by mouth daily with breakfast. 90 tablet 3  . citalopram (CELEXA) 20 MG tablet Take 1 tablet (20 mg total) by mouth daily. 90 tablet 3  . ergocalciferol (VITAMIN D2) 1.25 MG (50000 UT) capsule Take by mouth.    . fish oil-omega-3 fatty acids 1000 MG capsule Take 2 g by mouth 2 (two) times daily after a meal.    . levETIRAcetam (KEPPRA) 250 MG tablet Take 1 tablet (250 mg total) by mouth 2 (two) times daily. 60 tablet 3  . losartan (COZAAR) 50 MG tablet Take 1 tablet (50 mg total) by mouth daily. 90 tablet 3  . memantine (NAMENDA) 10 MG tablet Take 1 tablet (10 mg total) by mouth 2 (two) times daily. 60 tablet 5  . Multiple Vitamins-Calcium (ONE-A-DAY WOMENS PO) Take by mouth daily.    . pravastatin (PRAVACHOL) 20 MG tablet Take 1 tablet (20 mg total) by mouth at bedtime. 90 tablet 3  . XELPROS 0.005 % EMUL Apply 1 drop to eye daily.     No facility-administered medications prior to visit.     PAST MEDICAL HISTORY: Past Medical History:  Diagnosis Date  . Abnormal EEG 01/14/12   started on Keppra  . Dermatomyositis (HCC)    remission on pred until 2008, rheum at [facility]  . GERD (gastroesophageal reflux disease)    agravated with Fosamax  . Glaucoma   . Hypertension 2013  . Memory loss   . PMB (postmenopausal bleeding) 08/2003  . Seizures (HCC)   . Status post dilation of esophageal narrowing   . Urinary incontinence, mixed 07/01/11   Fitted  for 2.75 " Ring Pessary with support  . Vitamin D deficiency disease      PAST SURGICAL HISTORY: Past Surgical History:  Procedure Laterality Date  . COLONOSCOPY  09/25/04   Dr. Loreta Ave  . COLONOSCOPY  02/2009  . ENDOMETRIAL BIOPSY  08/24/03   weak proliferative endo, [facility] neg.  Marland Kitchen ESOPHAGOGASTRODUODENOSCOPY ENDOSCOPY  04/29/04   anemia without cause  . NO PAST SURGERIES       FAMILY HISTORY: Family History  Problem Relation Age of Onset  . Dementia Father        died age 54, otherwise healthy  . Hypertension Mother   . Heart  disease Maternal Grandfather   . Heart disease Maternal Grandmother   . Seizures Neg Hx      SOCIAL HISTORY: Social History   Socioeconomic History  . Marital status: Married    Spouse name: [name]  . Number of children: 1  . Years of education: hs  . Highest education level: Not on file  Occupational History  . Occupation: housewife  Tobacco Use  . Smoking status: Former Smoker    Packs/day: 1.00    Years: 15.00    Pack years: 15.00    Types: Cigarettes    Quit date: 02/04/1976    Years since quitting: 43.9  . Smokeless tobacco: Never Used  Vaping Use  . Vaping Use: Never used  Substance and Sexual Activity  . Alcohol use: Yes    Alcohol/week: 7.0 standard drinks    Types: 7 Glasses of wine per week    Comment: 1 glass of wine per day  . Drug use: No  . Sexual activity: Never    Partners: Male    Birth control/protection: Post-menopausal  Other Topics Concern  . Not on file  Social History Narrative   Married, lives with spouse [name]).   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   Patient has one child.   Patient is right-handed.   Patient drinks 2-3 cups of coffee daily and maybe half cup at night.               Social Determinants of Health   Financial Resource Strain: Low Risk   . Difficulty of Paying Living Expenses: Not hard at all  Food Insecurity:   .  Worried About Programme researcher, broadcasting/film/video in the Last Year: Not on file  . Ran Out of Food in the Last Year: Not on file  Transportation Needs: No Transportation Needs  . Lack of Transportation (Medical): No  . Lack of Transportation (Non-Medical): No  Physical Activity: Insufficiently Active  . Days of Exercise per Week: 3 days  . Minutes of Exercise per Session: 10 min  Stress:   . Feeling of Stress : Not on file  Social Connections: Moderately Integrated  . Frequency of Communication with Friends and Family: More than three times a week  . Frequency of Social Gatherings with Friends and Family: More than three times a week  . Attends Religious Services: Never  . Active Member of Clubs or Organizations: Yes  . Attends Banker Meetings: Never  . Marital Status: Married  Catering manager Violence:   . Fear of Current or Ex-Partner: Not on file  . Emotionally Abused: Not on file  . Physically Abused: Not on file  . Sexually Abused: Not on file      PHYSICAL EXAM  Vitals:   12/21/19 1420  BP: (!) 151/70  Pulse: (!) 53  Weight: 108 lb (49 kg)  Height: 4\' 11"  (1.499 m)   Body mass index is 21.81 kg/m.   Generalized: Well developed, in no acute distress  Cardiology: normal rate and rhythm, no murmur auscultated  Respiratory: clear to auscultation bilaterally    Neurological examination  Mentation: Alert oriented to month and year, not to specific date, oriented to city and state, oriented to most history taking. Follows all commands speech and language fluent Cranial nerve II-XII: Pupils were equal round reactive to light. Extraocular movements were full, visual field were full on confrontational test. Facial sensation and strength were normal. Head  turning and shoulder shrug  were normal and symmetric. Motor: The motor testing reveals 5 over 5 strength of all 4 extremities. Good symmetric motor tone is noted throughout.  Sensory: Sensory testing is intact to soft  touch on all 4 extremities. No evidence of extinction is noted.  Coordination: Cerebellar testing reveals good finger-nose-finger and heel-to-shin bilaterally.  Gait and station: Gait is short and wide with external rotation noted of bilateral feet, reduced arm swing, tandem not attempted. Gait is stable without assistive device but she does hold on to Mr Smiths arm for stability.  Reflexes: Deep tendon reflexes are symmetric and normal bilaterally.     DIAGNOSTIC DATA (LABS, IMAGING, TESTING) - I reviewed patient records, labs, notes, testing and imaging myself where available.  Lab Results  Component Value Date   WBC 9.2 03/03/2019   HGB 13.6 03/03/2019   HCT 39.4 03/03/2019   MCV 94.2 03/03/2019   PLT 271.0 03/03/2019      Component Value Date/Time   NA 138 03/03/2019 1301   NA 140 09/08/2017 1437   K 5.0 03/03/2019 1301   CL 103 03/03/2019 1301   CO2 31 03/03/2019 1301   GLUCOSE 85 03/03/2019 1301   BUN 25 (H) 03/03/2019 1301   BUN 19 09/08/2017 1437   CREATININE 0.66 03/03/2019 1301   CALCIUM 9.6 03/03/2019 1301   PROT 7.4 03/03/2019 1301   PROT 7.5 02/07/2016 1256   ALBUMIN 4.3 03/03/2019 1301   ALBUMIN 4.5 02/07/2016 1256   AST 24 03/03/2019 1301   ALT 19 03/03/2019 1301   ALKPHOS 74 03/03/2019 1301   BILITOT 0.6 03/03/2019 1301   BILITOT 0.5 02/07/2016 1256   GFRNONAA 91 09/08/2017 1437   GFRAA 104 09/08/2017 1437   Lab Results  Component Value Date   CHOL 217 (H) 03/03/2019   HDL 51.80 03/03/2019   LDLCALC 126 (H) 03/03/2019   LDLDIRECT 129.0 02/09/2017   TRIG 195.0 (H) 03/03/2019   CHOLHDL 4 03/03/2019   No results found for: HGBA1C Lab Results  Component Value Date   VITAMINB12 983 02/07/2016   Lab Results  Component Value Date   TSH 1.73 03/03/2019   MMSE - Mini Mental State Exam 11/23/2018 02/11/2018 03/25/2017  Orientation to time 2 2 5   Orientation to Place 3 3 5   Registration 3 3 3   Attention/ Calculation 2 4 5   Recall 1 2 0  Language-  name 2 objects 2 2 2   Language- repeat 1 0 1  Language- follow 3 step command 3 3 3   Language- read & follow direction 1 1 1   Write a sentence 1 1 1   Copy design 1 0 1  Copy design-comments named 5 animals - -  Total score 20 21 27      ASSESSMENT AND PLAN  74 y.o. year old female  has a past medical history of Abnormal EEG (01/14/12), Dermatomyositis (HCC), GERD (gastroesophageal reflux disease), Glaucoma, Hypertension (2013), Memory loss, PMB (postmenopausal bleeding) (08/2003), Seizures (HCC), Status post dilation of esophageal narrowing, Urinary incontinence, mixed (07/01/11), and Vitamin D deficiency disease. here with   Seizure disorder [facility](HCC)  Memory disturbance  Haliyah is doing well. We will continue levetiracetam 250mg  BID and memantine 10mg  BID. MMSE deferred today per patient and husband's request. Healthy lifestyle habits encouraged. Memory compensation strategies reviewed. She will continue close follow up with PCP. Return to see neurology in 1 year.   I spent 20 minutes of face-to-face and non-face-to-face time with patient.  This included previsit chart review,  lab review, study review, order entry, electronic health record documentation, patient education.    Shawnie Dapper, MSN, FNP-C 12/21/2019, 2:36 PM  [facility] [address] [place], [place] [address] [phone]

## 2019-12-21 NOTE — Patient Instructions (Signed)
Below is our plan:  We will continue levetiracetam 250mg  and memantine 10mg  twice daily.   Please make sure you are staying well hydrated. I recommend 50-60 ounces daily. Well balanced diet and regular exercise encouraged.   Please continue follow up with care team as directed.   Follow up in 1 year   You may receive a survey regarding today's visit. I encourage you to leave honest feed back as I do use this information to improve patient care. Thank you for seeing me today!      Memory Compensation Strategies  1. Use "WARM" strategy.  W= write it down  A= associate it  R= repeat it  M= make a mental note  2.   You can keep a .  Use a 3-ring notebook with sections for the following: calendar, important names and phone numbers,  medications, doctors' names/phone numbers, lists/reminders, and a section to journal what you did  each day.   3.    Use a calendar to write appointments down.  4.    Write yourself a schedule for the day.  This can be placed on the calendar or in a separate section of the Memory Notebook.  Keeping a  regular schedule can help memory.  5.    Use medication organizer with sections for each day or morning/evening pills.  You may need help loading it  6.    Keep a basket, or pegboard by the door.  Place items that you need to take out with you in the basket or on the pegboard.  You may also want to  include a message board for reminders.  7.    Use sticky notes.  Place sticky notes with reminders in a place where the task is performed.  For example: " turn off the  stove" placed by the stove, "lock the door" placed on the door at eye level, " take your medications" on  the bathroom mirror or by the place where you normally take your medications.  8.    Use alarms/timers.  Use while cooking to remind yourself to check on food or as a reminder to take your medicine, or as a  reminder to make a call, or as a reminder to perform another task,  etc.   Seizure, Adult A seizure is a sudden burst of abnormal electrical activity in the brain. Seizures usually last from 30 seconds to 2 minutes. They can cause many different symptoms. Usually, seizures are not harmful unless they last a long time. What are the causes? Common causes of this condition include:  Fever or infection.  Conditions that affect the brain, such as: ? A brain abnormality that you were born with. ? A brain or head injury. ? Bleeding in the brain. ? A tumor. ? Stroke. ? Brain disorders such as autism or cerebral palsy.  Low blood sugar.  Conditions that are passed from parent to child (are inherited).  Problems with substances, such as: ? Having a reaction to a drug or a medicine. ? Suddenly stopping the use of a substance (withdrawal). In some cases, the cause may not be known. A person who has repeated seizures over time without a clear cause has a condition called epilepsy. What increases the risk? You are more likely to get this condition if you have:  A family history of epilepsy.  Had a seizure in the past.  A brain disorder.  A history of head injury, lack of oxygen at birth, or  strokes. What are the signs or symptoms? There are many types of seizures. The symptoms vary depending on the type of seizure you have. Examples of symptoms during a seizure include:  Shaking (convulsions).  Stiffness in the body.  Passing out (losing consciousness).  Head nodding.  Staring.  Not responding to sound or touch.  Loss of bladder control and bowel control. Some people have symptoms right before and right after a seizure happens. Symptoms before a seizure may include:  Fear.  Worry (anxiety).  Feeling like you may vomit (nauseous).  Feeling like the room is spinning (vertigo).  Feeling like you saw or heard something before (dj vu).  Odd tastes or smells.  Changes in how you see. You may see flashing lights or spots. Symptoms  after a seizure happens can include:  Confusion.  Sleepiness.  Headache.  Weakness on one side of the body. How is this treated? Most seizures will stop on their own in under 5 minutes. In these cases, no treatment is needed. Seizures that last longer than 5 minutes will usually need treatment. Treatment can include:  Medicines given through an IV tube.  Avoiding things that are known to cause your seizures. These can include medicines that you take for another condition.  Medicines to treat epilepsy.  Surgery to stop the seizures. This may be needed if medicines do not help. Follow these instructions at home: Medicines  Take over-the-counter and prescription medicines only as told by your doctor.  Do not eat or drink anything that may keep your medicine from working, such as alcohol. Activity  Do not do any activities that would be dangerous if you had another seizure, like driving or swimming. Wait until your doctor says it is safe for you to do them.  If you live in the [place] ask your local DMV (department of motor vehicles) when you can drive.  Get plenty of rest. Teaching others Teach friends and family what to do when you have a seizure. They should:  Lay you on the ground.  Protect your head and body.  Loosen any tight clothing around your neck.  Turn you on your side.  Not hold you down.  Not put anything into your mouth.  Know whether or not you need emergency care.  Stay with you until you are better.  General instructions  Contact your doctor each time you have a seizure.  Avoid anything that gives you seizures.  Keep a seizure diary. Write down: ? What you think caused each seizure. ? What you remember about each seizure.  Keep all follow-up visits as told by your doctor. This is important. Contact a doctor if:  You have another seizure.  You have seizures more often.  There is any change in what happens during your seizures.  You keep  having seizures with treatment.  You have symptoms of being sick or having an infection. Get help right away if:  You have a seizure that: ? Lasts longer than 5 minutes. ? Is different than seizures you had before. ? Makes it harder to breathe. ? Happens after you hurt your head.  You have any of these symptoms after a seizure: ? Not being able to speak. ? Not being able to use a part of your body. ? Confusion. ? A bad headache.  You have two or more seizures in a row.  You do not wake up right after a seizure.  You get hurt during a seizure. These symptoms may be  an emergency. Do not wait to see if the symptoms will go away. Get medical help right away. Call your local emergency services (911 in the [place] Do not drive yourself to the hospital. Summary  Seizures usually last from 30 seconds to 2 minutes. Usually, they are not harmful unless they last a long time.  Do not eat or drink anything that may keep your medicine from working, such as alcohol.  Teach friends and family what to do when you have a seizure.  Contact your doctor each time you have a seizure. This information is not intended to replace advice given to you by your health care provider. Make sure you discuss any questions you have with your health care provider. Document Revised: 04/09/2018 Document Reviewed: 04/09/2018 Elsevier Patient Education  2020 [employer].

## 2019-12-21 NOTE — Progress Notes (Signed)
I have read the note, and I agree with the clinical assessment and plan.  Dorota Heinrichs K Zakk Borgen   

## 2020-01-24 ENCOUNTER — Other Ambulatory Visit: Payer: Self-pay | Admitting: Nurse Practitioner

## 2020-01-24 ENCOUNTER — Other Ambulatory Visit: Payer: Self-pay | Admitting: *Deleted

## 2020-01-24 DIAGNOSIS — E559 Vitamin D deficiency, unspecified: Secondary | ICD-10-CM

## 2020-01-24 MED ORDER — MEMANTINE HCL 10 MG PO TABS: 10.0000 mg | ORAL_TABLET | Freq: Two times a day (BID) | ORAL | 5 refills | Status: DC

## 2020-02-08 ENCOUNTER — Other Ambulatory Visit: Payer: Self-pay | Admitting: Nurse Practitioner

## 2020-02-08 DIAGNOSIS — E782 Mixed hyperlipidemia: Secondary | ICD-10-CM

## 2020-02-27 ENCOUNTER — Other Ambulatory Visit: Payer: Self-pay | Admitting: Nurse Practitioner

## 2020-02-27 DIAGNOSIS — I10 Essential (primary) hypertension: Secondary | ICD-10-CM

## 2020-02-28 ENCOUNTER — Other Ambulatory Visit: Payer: Self-pay | Admitting: Nurse Practitioner

## 2020-02-28 DIAGNOSIS — F3342 Major depressive disorder, recurrent, in full remission: Secondary | ICD-10-CM

## 2020-03-02 ENCOUNTER — Encounter: Payer: Self-pay | Admitting: Internal Medicine

## 2020-04-02 ENCOUNTER — Other Ambulatory Visit: Payer: Self-pay | Admitting: Nurse Practitioner

## 2020-04-02 DIAGNOSIS — I10 Essential (primary) hypertension: Secondary | ICD-10-CM

## 2020-04-04 NOTE — Telephone Encounter (Signed)
Scheduled

## 2020-04-18 ENCOUNTER — Encounter: Payer: Self-pay | Admitting: Internal Medicine

## 2020-04-19 ENCOUNTER — Other Ambulatory Visit: Payer: Self-pay | Admitting: Nurse Practitioner

## 2020-04-19 DIAGNOSIS — F3342 Major depressive disorder, recurrent, in full remission: Secondary | ICD-10-CM

## 2020-04-19 DIAGNOSIS — I10 Essential (primary) hypertension: Secondary | ICD-10-CM

## 2020-04-19 NOTE — Telephone Encounter (Signed)
Request for pending medication last refill 03/05/20 30 day supply with (0) refills. Last OV 03/10/17 patient has upcoming appointment scheduled for 05/02/20. Please advise.

## 2020-04-23 ENCOUNTER — Telehealth: Payer: Self-pay | Admitting: Family Medicine

## 2020-04-23 ENCOUNTER — Other Ambulatory Visit: Payer: Self-pay | Admitting: *Deleted

## 2020-04-23 DIAGNOSIS — G40909 Epilepsy, unspecified, not intractable, without status epilepticus: Secondary | ICD-10-CM

## 2020-04-23 MED ORDER — LEVETIRACETAM 250 MG PO TABS: 250.0000 mg | ORAL_TABLET | Freq: Two times a day (BID) | ORAL | 7 refills | Status: DC

## 2020-04-23 NOTE — Telephone Encounter (Signed)
E-scribed refill as requested. 

## 2020-04-23 NOTE — Telephone Encounter (Signed)
[facility] - [place], [place] - [phone] is asking for a refill on pt's levETIRAcetam (KEPPRA) 250 MG tablet

## 2020-04-30 ENCOUNTER — Other Ambulatory Visit: Payer: Self-pay | Admitting: Nurse Practitioner

## 2020-04-30 DIAGNOSIS — F3342 Major depressive disorder, recurrent, in full remission: Secondary | ICD-10-CM

## 2020-04-30 DIAGNOSIS — I10 Essential (primary) hypertension: Secondary | ICD-10-CM

## 2020-04-30 DIAGNOSIS — E559 Vitamin D deficiency, unspecified: Secondary | ICD-10-CM

## 2020-05-02 ENCOUNTER — Other Ambulatory Visit: Payer: Self-pay

## 2020-05-02 ENCOUNTER — Ambulatory Visit (INDEPENDENT_AMBULATORY_CARE_PROVIDER_SITE_OTHER): Payer: Medicare Other | Admitting: Nurse Practitioner

## 2020-05-02 ENCOUNTER — Encounter: Payer: Self-pay | Admitting: Nurse Practitioner

## 2020-05-02 VITALS — BP 128/64 | HR 60 | Temp 97.2°F | Ht 61.0 in | Wt 107.6 lb

## 2020-05-02 DIAGNOSIS — E781 Pure hyperglyceridemia: Secondary | ICD-10-CM | POA: Diagnosis not present

## 2020-05-02 DIAGNOSIS — I10 Essential (primary) hypertension: Secondary | ICD-10-CM | POA: Diagnosis not present

## 2020-05-02 DIAGNOSIS — E559 Vitamin D deficiency, unspecified: Secondary | ICD-10-CM | POA: Diagnosis not present

## 2020-05-02 DIAGNOSIS — F3342 Major depressive disorder, recurrent, in full remission: Secondary | ICD-10-CM

## 2020-05-02 MED ORDER — PRAVASTATIN SODIUM 20 MG PO TABS: 20.0000 mg | ORAL_TABLET | Freq: Every day | ORAL | 3 refills | Status: DC

## 2020-05-02 MED ORDER — AMLODIPINE BESYLATE 2.5 MG PO TABS: 2.5000 mg | ORAL_TABLET | Freq: Every day | ORAL | 3 refills | Status: DC

## 2020-05-02 MED ORDER — LOSARTAN POTASSIUM 50 MG PO TABS: 50.0000 mg | ORAL_TABLET | Freq: Every day | ORAL | 3 refills | Status: DC

## 2020-05-02 MED ORDER — CITALOPRAM HYDROBROMIDE 20 MG PO TABS: 20.0000 mg | ORAL_TABLET | Freq: Every day | ORAL | 3 refills | Status: DC

## 2020-05-02 NOTE — Patient Instructions (Signed)
Second covid booster dose can be administered 33months after the 1st booster dose.  Schedule fasting lab appt for blood draw. Need to be fasting at least 6hrs prior to appt.

## 2020-05-02 NOTE — Progress Notes (Signed)
Subjective:  Patient ID: April Chung, female    DOB: Apr 20, 1945  Age: 75 y.o. MRN: [id number]  CC: Medication Refill (Here for meds  refill, denies any complications with medications )  HPI Accompanied by husband Denies any acute complaint. No fall in last 1year No change in appetite or sleep pattern.  Hypertension BP at goal with amlodipine and losartan BP Readings from Last 3 Encounters:  05/02/20 128/64  12/21/19 (!) 151/70  02/24/19 114/73   Repeat BMP  Hypertriglyceridemia No muscle pain or weakness. Repeat lipid and hepatic panel Continue fish oil and pravastatin for now  Vitamin D deficiency Repeat Vit. D  Recurrent major depressive disorder (HCC) Stable mood per Mr. Mates Continue citalopram. Unable to complete PHQ/GAD due to memory deficit  Wt Readings from Last 3 Encounters:  05/02/20 107 lb 9.6 oz (48.8 kg)  12/21/19 108 lb (49 kg)  08/25/19 108 lb (49 kg)   Reviewed past Medical, Social and Family history today.  Outpatient Medications Prior to Visit  Medication Sig Dispense Refill  . aspirin 81 MG tablet Take 81 mg by mouth daily.    . brimonidine (ALPHAGAN) 0.15 % ophthalmic solution 1 drop 2 (two) times daily.    . calcium carbonate (OSCAL) 1500 (600 Ca) MG TABS tablet TAKE 1 TABLET BY MOUTH EVERY DAY WITH BREAKFAST 30 tablet 0  . fish oil-omega-3 fatty acids 1000 MG capsule Take 2 g by mouth 2 (two) times daily after a meal.    . levETIRAcetam (KEPPRA) 250 MG tablet Take 1 tablet (250 mg total) by mouth 2 (two) times daily. 60 tablet 7  . memantine (NAMENDA) 10 MG tablet Take 1 tablet (10 mg total) by mouth 2 (two) times daily. 60 tablet 5  . Multiple Vitamins-Calcium (ONE-A-DAY WOMENS PO) Take by mouth daily.    . XELPROS 0.005 % EMUL Apply 1 drop to eye daily.    Marland Kitchen amLODipine (NORVASC) 2.5 MG tablet TAKE 1 TABLET BY MOUTH EVERY DAY. make appt FOR further refills 90 tablet 0  . citalopram (CELEXA) 20 MG tablet TAKE 1 TABLET BY MOUTH EVERY DAY 30  tablet 0  . losartan (COZAAR) 50 MG tablet TAKE 1 TABLET BY MOUTH EVERY DAY 30 tablet 0  . pravastatin (PRAVACHOL) 20 MG tablet Take 1 tablet (20 mg total) by mouth at bedtime. 30 tablet 3  . ergocalciferol (VITAMIN D2) 1.25 MG (50000 UT) capsule Take by mouth. (Patient not taking: Reported on 05/02/2020)     No facility-administered medications prior to visit.    ROS See HPI  Objective:  BP 128/64 (BP Location: Left Arm, Patient Position: Sitting, Cuff Size: Normal)   Pulse 60   Temp (!) 97.2 F (36.2 C) (Temporal)   Ht 5\' 1"  (1.549 m)   Wt 107 lb 9.6 oz (48.8 kg)   LMP 08/03/1993 (Approximate)   SpO2 93%   BMI 20.33 kg/m   Physical Exam Vitals reviewed.  Constitutional:      General: She is not in acute distress. Neck:     Thyroid: No thyromegaly.  Cardiovascular:     Rate and Rhythm: Normal rate and regular rhythm.     Pulses: Normal pulses.     Heart sounds: Normal heart sounds.  Pulmonary:     Effort: Pulmonary effort is normal.     Breath sounds: Normal breath sounds.  Chest:     Chest wall: No tenderness.  Abdominal:     General: Bowel sounds are normal. There is no distension.  Palpations: Abdomen is soft.     Tenderness: There is no abdominal tenderness.  Musculoskeletal:     Cervical back: Normal range of motion and neck supple.     Right lower leg: No edema.     Left lower leg: No edema.  Lymphadenopathy:     Cervical: No cervical adenopathy.  Neurological:     Mental Status: She is alert.     Comments: Oriented to person, place and family  Psychiatric:        Mood and Affect: Mood normal.        Behavior: Behavior normal.    Assessment & Plan:  This visit occurred during the SARS-CoV-2 public health emergency.  Safety protocols were in place, including screening questions prior to the visit, additional usage of staff PPE, and extensive cleaning of exam room while observing appropriate contact time as indicated for disinfecting solutions.   Rennae  was seen today for medication refill.  Diagnoses and all orders for this visit:  Primary hypertension -     Basic metabolic panel; Future -     amLODipine (NORVASC) 2.5 MG tablet; Take 1 tablet (2.5 mg total) by mouth daily. -     losartan (COZAAR) 50 MG tablet; Take 1 tablet (50 mg total) by mouth daily.  Hypertriglyceridemia -     Lipid panel; Future -     Hepatic function panel; Future -     pravastatin (PRAVACHOL) 20 MG tablet; Take 1 tablet (20 mg total) by mouth at bedtime.  Recurrent major depressive disorder, in full remission (HCC) -     citalopram (CELEXA) 20 MG tablet; Take 1 tablet (20 mg total) by mouth daily.  Vitamin D deficiency -     Vitamin D 1,25 dihydroxy; Future    Problem List Items Addressed This Visit      Cardiovascular and Mediastinum   Hypertension - Primary    BP at goal with amlodipine and losartan BP Readings from Last 3 Encounters:  05/02/20 128/64  12/21/19 (!) 151/70  02/24/19 114/73   Repeat BMP      Relevant Medications   amLODipine (NORVASC) 2.5 MG tablet   losartan (COZAAR) 50 MG tablet   pravastatin (PRAVACHOL) 20 MG tablet   Other Relevant Orders   Basic metabolic panel     Other   Hypertriglyceridemia    No muscle pain or weakness. Repeat lipid and hepatic panel Continue fish oil and pravastatin for now      Relevant Medications   amLODipine (NORVASC) 2.5 MG tablet   losartan (COZAAR) 50 MG tablet   pravastatin (PRAVACHOL) 20 MG tablet   Other Relevant Orders   Lipid panel   Hepatic function panel   Recurrent major depressive disorder (HCC)    Stable mood per Mr. Christoph Continue citalopram. Unable to complete PHQ/GAD due to memory deficit      Relevant Medications   citalopram (CELEXA) 20 MG tablet   Vitamin D deficiency    Repeat Vit. D      Relevant Orders   Vitamin D 1,25 dihydroxy      Follow-up: Return in about 6 months (around 11/02/2020) for HTN and , hyperlipidemia (fasting.  Alysia Penna, NP

## 2020-05-03 DIAGNOSIS — D509 Iron deficiency anemia, unspecified: Secondary | ICD-10-CM | POA: Insufficient documentation

## 2020-05-03 NOTE — Assessment & Plan Note (Signed)
Stable mood per April Chung Continue citalopram. Unable to complete PHQ/GAD due to memory deficit

## 2020-05-03 NOTE — Assessment & Plan Note (Signed)
No muscle pain or weakness. Repeat lipid and hepatic panel Continue fish oil and pravastatin for now

## 2020-05-03 NOTE — Assessment & Plan Note (Signed)
BP at goal with amlodipine and losartan BP Readings from Last 3 Encounters:  05/02/20 128/64  12/21/19 (!) 151/70  02/24/19 114/73   Repeat BMP

## 2020-05-03 NOTE — Assessment & Plan Note (Signed)
Repeat Vit. D 

## 2020-05-07 ENCOUNTER — Other Ambulatory Visit: Payer: Self-pay

## 2020-05-08 ENCOUNTER — Other Ambulatory Visit (INDEPENDENT_AMBULATORY_CARE_PROVIDER_SITE_OTHER): Payer: Medicare Other

## 2020-05-08 DIAGNOSIS — E559 Vitamin D deficiency, unspecified: Secondary | ICD-10-CM | POA: Diagnosis not present

## 2020-05-08 DIAGNOSIS — I10 Essential (primary) hypertension: Secondary | ICD-10-CM | POA: Diagnosis not present

## 2020-05-08 DIAGNOSIS — E781 Pure hyperglyceridemia: Secondary | ICD-10-CM

## 2020-05-08 LAB — BASIC METABOLIC PANEL
BUN: 24 mg/dL — ABNORMAL HIGH (ref 6–23)
CO2: 28 mEq/L (ref 19–32)
Calcium: 9.2 mg/dL (ref 8.4–10.5)
Chloride: 105 mEq/L (ref 96–112)
Creatinine, Ser: 0.62 mg/dL (ref 0.40–1.20)
GFR: 86.05 mL/min (ref >60.00)
Glucose, Bld: 87 mg/dL (ref 70–99)
Potassium: 4.4 mEq/L (ref 3.5–5.1)
Sodium: 139 mEq/L (ref 135–145)

## 2020-05-08 LAB — HEPATIC FUNCTION PANEL
ALT: 26 U/L (ref 0–35)
AST: 32 U/L (ref 0–37)
Albumin: 4.2 g/dL (ref 3.5–5.2)
Alkaline Phosphatase: 63 U/L (ref 39–117)
Bilirubin, Direct: 0.1 mg/dL (ref 0.0–0.3)
Total Bilirubin: 0.6 mg/dL (ref 0.2–1.2)
Total Protein: 7.3 g/dL (ref 6.0–8.3)

## 2020-05-08 NOTE — Progress Notes (Signed)
Per orders of NP Alysia Penna pt is here for lab draw pt tolerated draw well.

## 2020-05-09 LAB — LIPID PANEL
Cholesterol: 173 mg/dL (ref 0–200)
HDL: 45.5 mg/dL (ref >39.00)
LDL Cholesterol: 98 mg/dL (ref 0–99)
NonHDL: 127.61
Total CHOL/HDL Ratio: 4
Triglycerides: 150 mg/dL — ABNORMAL HIGH (ref 0.0–149.0)
VLDL: 30 mg/dL (ref 0.0–40.0)

## 2020-05-12 LAB — VITAMIN D 1,25 DIHYDROXY
Vitamin D 1, 25 (OH)2 Total: 52 pg/mL (ref 18–72)
Vitamin D2 1, 25 (OH)2: <8 pg/mL
Vitamin D3 1, 25 (OH)2: 52 pg/mL

## 2020-05-12 MED ORDER — FENOFIBRATE 145 MG PO TABS: 145.0000 mg | ORAL_TABLET | Freq: Every day | ORAL | 1 refills | Status: DC

## 2020-05-12 NOTE — Addendum Note (Signed)
Addended by: Alysia Penna L on: 05/12/2020 04:55 PM   Modules accepted: Orders

## 2020-05-12 NOTE — Assessment & Plan Note (Signed)
Stop fish oil start fenofibrate. Continue pravastatin F/up in 61months (fasting)

## 2020-06-13 ENCOUNTER — Other Ambulatory Visit: Payer: Self-pay | Admitting: Nurse Practitioner

## 2020-06-13 DIAGNOSIS — E559 Vitamin D deficiency, unspecified: Secondary | ICD-10-CM

## 2020-06-19 ENCOUNTER — Other Ambulatory Visit: Payer: Self-pay | Admitting: Nurse Practitioner

## 2020-06-19 DIAGNOSIS — I10 Essential (primary) hypertension: Secondary | ICD-10-CM

## 2020-06-20 NOTE — Telephone Encounter (Signed)
Refill sent 05/02/20  #90/3 Patient should have 3 refills

## 2020-06-26 ENCOUNTER — Other Ambulatory Visit: Payer: Self-pay | Admitting: *Deleted

## 2020-06-26 MED ORDER — MEMANTINE HCL 10 MG PO TABS: 10.0000 mg | ORAL_TABLET | Freq: Two times a day (BID) | ORAL | 5 refills | Status: DC

## 2020-09-25 ENCOUNTER — Encounter: Payer: Self-pay | Admitting: Nurse Practitioner

## 2020-09-25 DIAGNOSIS — E559 Vitamin D deficiency, unspecified: Secondary | ICD-10-CM

## 2020-09-26 ENCOUNTER — Other Ambulatory Visit: Payer: Self-pay | Admitting: Nurse Practitioner

## 2020-09-26 DIAGNOSIS — E559 Vitamin D deficiency, unspecified: Secondary | ICD-10-CM

## 2020-09-26 MED ORDER — CALCIUM CARBONATE 1500 (600 CA) MG PO TABS: 0.5000 | ORAL_TABLET | Freq: Every day | ORAL | 3 refills | Status: DC

## 2020-10-09 ENCOUNTER — Other Ambulatory Visit: Payer: Self-pay | Admitting: Nurse Practitioner

## 2020-10-09 DIAGNOSIS — E781 Pure hyperglyceridemia: Secondary | ICD-10-CM

## 2020-10-09 NOTE — Telephone Encounter (Signed)
Chart supports rx refill Last ov: 05/02/2020 Last refill: 09/10/2020

## 2020-11-01 ENCOUNTER — Other Ambulatory Visit: Payer: Self-pay

## 2020-11-01 DIAGNOSIS — G40909 Epilepsy, unspecified, not intractable, without status epilepticus: Secondary | ICD-10-CM

## 2020-11-01 MED ORDER — LEVETIRACETAM 250 MG PO TABS
250.0000 mg | ORAL_TABLET | Freq: Two times a day (BID) | ORAL | 2 refills | Status: DC
Start: 2020-11-01 — End: 2020-12-25

## 2020-11-05 ENCOUNTER — Other Ambulatory Visit: Payer: Self-pay

## 2020-11-06 ENCOUNTER — Encounter: Payer: Self-pay | Admitting: Nurse Practitioner

## 2020-11-06 ENCOUNTER — Ambulatory Visit (INDEPENDENT_AMBULATORY_CARE_PROVIDER_SITE_OTHER): Payer: Medicare Other | Admitting: Nurse Practitioner

## 2020-11-06 VITALS — BP 112/60 | HR 60 | Temp 97.0°F | Wt 103.6 lb

## 2020-11-06 DIAGNOSIS — E781 Pure hyperglyceridemia: Secondary | ICD-10-CM | POA: Diagnosis not present

## 2020-11-06 DIAGNOSIS — F3342 Major depressive disorder, recurrent, in full remission: Secondary | ICD-10-CM | POA: Diagnosis not present

## 2020-11-06 DIAGNOSIS — Z23 Encounter for immunization: Secondary | ICD-10-CM

## 2020-11-06 DIAGNOSIS — I10 Essential (primary) hypertension: Secondary | ICD-10-CM

## 2020-11-06 LAB — LIPID PANEL
Cholesterol: 153 mg/dL (ref 0–200)
HDL: 52.2 mg/dL (ref >39.00)
LDL Cholesterol: 82 mg/dL (ref 0–99)
NonHDL: 101.24
Total CHOL/HDL Ratio: 3
Triglycerides: 94 mg/dL (ref 0.0–149.0)
VLDL: 18.8 mg/dL (ref 0.0–40.0)

## 2020-11-06 LAB — TSH: TSH: 1.83 u[IU]/mL (ref 0.35–5.50)

## 2020-11-06 MED ORDER — AMLODIPINE BESYLATE 2.5 MG PO TABS: 2.5000 mg | ORAL_TABLET | Freq: Every day | ORAL | 3 refills | Status: DC

## 2020-11-06 MED ORDER — LOSARTAN POTASSIUM 50 MG PO TABS: 50.0000 mg | ORAL_TABLET | Freq: Every day | ORAL | 3 refills | Status: DC

## 2020-11-06 MED ORDER — CITALOPRAM HYDROBROMIDE 20 MG PO TABS: 20.0000 mg | ORAL_TABLET | Freq: Every day | ORAL | 3 refills | Status: DC

## 2020-11-06 MED ORDER — PRAVASTATIN SODIUM 20 MG PO TABS: 20.0000 mg | ORAL_TABLET | Freq: Every day | ORAL | 3 refills | Status: DC

## 2020-11-06 MED ORDER — FENOFIBRATE 54 MG PO TABS: 54.0000 mg | ORAL_TABLET | Freq: Every day | ORAL | 1 refills | Status: DC

## 2020-11-06 NOTE — Addendum Note (Signed)
Addended by: Alysia Penna L on: 11/06/2020 02:59 PM   Modules accepted: Orders

## 2020-11-06 NOTE — Assessment & Plan Note (Signed)
BP at goal with amlodipine and losartan Denies any hypotension BP Readings from Last 3 Encounters:  11/06/20 112/60  05/02/20 128/64  12/21/19 (!) 151/70   Maintain current medication doses

## 2020-11-06 NOTE — Patient Instructions (Addendum)
Go to lab for blood draw.  Maintain current medications.  Obtain shingrix vaccine from retail pharmacy.

## 2020-11-06 NOTE — Assessment & Plan Note (Signed)
Denies any adverse effects with fenofibrate and pravastatin. Repeat lipid panel today

## 2020-11-06 NOTE — Progress Notes (Signed)
Subjective:  Patient ID: April Chung, female    DOB: 17-Jan-1946  Age: 75 y.o. MRN: [id number]  CC: Follow-up (6 month f/u on HTN and cholesterol. /Pt is fasting. /Flu vaccine given today. )  HPI Accompanied by her husband.  Hypertension BP at goal with amlodipine and losartan Denies any hypotension BP Readings from Last 3 Encounters:  11/06/20 112/60  05/02/20 128/64  12/21/19 (!) 151/70   Maintain current medication doses  Hypertriglyceridemia Denies any adverse effects with fenofibrate and pravastatin. Repeat lipid panel today  Wt Readings from Last 3 Encounters:  11/06/20 103 lb 9.6 oz (47 kg)  05/02/20 107 lb 9.6 oz (48.8 kg)  12/21/19 108 lb (49 kg)    Reviewed past Medical, Social and Family history today.  Outpatient Medications Prior to Visit  Medication Sig Dispense Refill   aspirin 81 MG tablet Take 81 mg by mouth daily.     brimonidine (ALPHAGAN) 0.15 % ophthalmic solution 1 drop 2 (two) times daily.     calcium carbonate (OSCAL) 1500 (600 Ca) MG TABS tablet Take 0.5 tablets (750 mg total) by mouth daily with breakfast. 45 tablet 3   fenofibrate (TRICOR) 145 MG tablet TAKE 1 TABLET BY MOUTH ONCE DAILY 90 tablet 1   levETIRAcetam (KEPPRA) 250 MG tablet Take 1 tablet (250 mg total) by mouth 2 (two) times daily. Please keep upcoming appt for further refills 60 tablet 2   memantine (NAMENDA) 10 MG tablet Take 1 tablet (10 mg total) by mouth 2 (two) times daily. 60 tablet 5   Multiple Vitamins-Calcium (ONE-A-DAY WOMENS PO) Take by mouth daily.     pravastatin (PRAVACHOL) 20 MG tablet TAKE 1 TABLET BY MOUTH AT BEDTIME 30 tablet 3   XELPROS 0.005 % EMUL Apply 1 drop to eye daily.     amLODipine (NORVASC) 2.5 MG tablet Take 1 tablet (2.5 mg total) by mouth daily. 90 tablet 3   citalopram (CELEXA) 20 MG tablet Take 1 tablet (20 mg total) by mouth daily. 90 tablet 3   losartan (COZAAR) 50 MG tablet Take 1 tablet (50 mg total) by mouth daily. 90 tablet 3   No  facility-administered medications prior to visit.    ROS See HPI  Objective:  BP 112/60 (BP Location: Right Arm, Patient Position: Sitting, Cuff Size: Normal)   Pulse 60   Temp (!) 97 F (36.1 C) (Temporal)   Wt 103 lb 9.6 oz (47 kg)   LMP 08/03/1993 (Approximate)   SpO2 97%   BMI 19.58 kg/m   Physical Exam Cardiovascular:     Rate and Rhythm: Normal rate and regular rhythm.     Pulses: Normal pulses.     Heart sounds: Normal heart sounds.  Pulmonary:     Effort: Pulmonary effort is normal.     Breath sounds: Normal breath sounds.  Musculoskeletal:     Cervical back: Normal range of motion and neck supple.     Right lower leg: No edema.     Left lower leg: No edema.  Neurological:     Mental Status: She is alert and oriented to person, place, and time.  Psychiatric:        Mood and Affect: Mood normal.        Behavior: Behavior normal.        Thought Content: Thought content normal.    Assessment & Plan:  This visit occurred during the SARS-CoV-2 public health emergency.  Safety protocols were in place, including screening questions prior to  the visit, additional usage of staff PPE, and extensive cleaning of exam room while observing appropriate contact time as indicated for disinfecting solutions.   Bryssa was seen today for follow-up.  Diagnoses and all orders for this visit:  Primary hypertension -     losartan (COZAAR) 50 MG tablet; Take 1 tablet (50 mg total) by mouth daily. -     amLODipine (NORVASC) 2.5 MG tablet; Take 1 tablet (2.5 mg total) by mouth daily.  Hypertriglyceridemia -     Lipid panel -     TSH  Flu vaccine need -     Flu Vaccine QUAD High Dose(Fluad)  Recurrent major depressive disorder, in full remission (HCC) -     citalopram (CELEXA) 20 MG tablet; Take 1 tablet (20 mg total) by mouth daily.   Problem List Items Addressed This Visit       Cardiovascular and Mediastinum   Hypertension - Primary    BP at goal with amlodipine and  losartan Denies any hypotension BP Readings from Last 3 Encounters:  11/06/20 112/60  05/02/20 128/64  12/21/19 (!) 151/70   Maintain current medication doses      Relevant Medications   losartan (COZAAR) 50 MG tablet   amLODipine (NORVASC) 2.5 MG tablet     Other   Hypertriglyceridemia    Denies any adverse effects with fenofibrate and pravastatin. Repeat lipid panel today      Relevant Medications   losartan (COZAAR) 50 MG tablet   amLODipine (NORVASC) 2.5 MG tablet   Other Relevant Orders   Lipid panel   TSH   Recurrent major depressive disorder (HCC)   Relevant Medications   citalopram (CELEXA) 20 MG tablet   Other Visit Diagnoses     Flu vaccine need       Relevant Orders   Flu Vaccine QUAD High Dose(Fluad) (Completed)       Follow-up: Return in about 6 months (around 05/07/2021) for HTN and , hyperlipidemia (fasting).  Alysia Penna, NP

## 2020-12-24 NOTE — Progress Notes (Signed)
Chief Complaint  Patient presents with   Follow-up    Rm 1, w husband. Here for yearly SZ f/u. Pt reports doing well. No sz like activity or falls since last OV.     HISTORY OF PRESENT ILLNESS: 12/25/20 ALL: April Chung returns for follow up for seizures and memory loss. She continues levetiracetam 250mg  BID and memantine 10mg  BID. She continues to do well. She is tolerating meds. Memory is stable. No significant changes. She denies falls. Appetite is good. She has lost about 3 pounds over the year but contributes this to eating healthier foods. She continues to perform ADLs independently. She is able to manage her own medications. She walks her dogs every day and tries to walk around in the yard when she can. She enjoys crossword puzzles. She drinks about 1/2 glass of merlot every night.   12/21/2019 ALL:  April Chung is a 75 y.o. female here today for follow up for seizures and memory loss. She continues levetiracetam 250mg  BID and Namenda 10mg  BID. She presents with her husband who aids in history. She feels she is doing well and husband agrees. She continues to be fairly independent. Mr Pember helps with meal preparation and medications. She is able to perform ADL's. She is less active than she used to be but continues to walk daily. No seizure activity.    HISTORY (copied from previous note)  April Chung is a 75 y.o. female here today for follow up of seizure ans memory loss. She continues Keppra 250mg  twice daily. She is also taking Namenda 10mg  twice daily. She was unable to tolerate Aricept due to low heart rate.  Overall, she feels that she is doing very well.  Her husband is with her today and agrees.  They feel that memory is fairly stable.  She is able to perform ADLs independently.  She does not drive.  She is not as active as she used to be.  Her husband mentions that walking has become much slower and pace.  He states that she seems stable in balance but moves her feet much slower than  she used to.  He denies shuffling or stooped posture.  He has considered having her to use a walking stick but is concerned that she will not use it.  There have been no falls.  No strokelike symptoms.  No seizure activity.   She and her husband deny concerns of elevated blood pressures at home.  They admit that they do not routinely check.  Blood pressure was normal in the office with Megan in January/2020.  Initial blood pressure reading in office today was 182/100.  At completion of visit we recheck blood pressure and it is 151/77.  Her husband feels that normal blood pressure readings are 130s over 70s to 80s.  She is followed by primary care for hypertension.  She is currently taking Cozaar 50 mg daily as well as Norvasc 2.5 mg daily.  She denies headaches, dizziness, chest pain or trouble breathing.   HISTORY: (copied from Judie Petit note on 02/11/2018)   April Chung is a 75 year old right handed female who presents today for follow up regarding seizures and memory loss. She is taking Namenda 10mg  twice daily. She had to stop Aricept due to decreased heart rate. Mr Peckenpaugh states that he has not been able to tell much of a difference since stopping Aricept with the exception of slower movements. She is tolerating Namenda well. MMSE today is 21/30.  In 03/2017 she scored 27/30. She continues to perform all ADL's independently. Mr Dalomba works during the day and she is doing well staying home alone. He does assist with dosing medications. She has become reclusive and typically does not like to go out much per her husband.   She is taking Keppra 250mg  twice daily. She denies seizure like activity. She is tolerating Keppra well without excessive drowsiness.    HISTORY: (copied from Dr Anne Hahn' note from 02/10/2017)   April Chung is a 75 year old right-handed white female with a history of a mild memory disturbance and history of seizures.  The patient is on Aricept taking 10 mg daily, she is on Namenda taking  10 mg twice daily.  She tolerates these medications well.  She is on low-dose Keppra, she tolerates this drug also without drowsiness.  She has not had any recurring seizures since last seen, she does operate a motor vehicle without difficulty.  The patient has not noted any progression of balance issues, she denies any progression of memory.  She returns to this office for an evaluation    REVIEW OF SYSTEMS: Out of a complete 14 system review of symptoms, the patient complains only of the following symptoms, memory loss and all other reviewed systems are negative.   ALLERGIES: Allergies  Allergen Reactions   Aricept [Donepezil Hcl] Other (See Comments)    Low heart rate, do not take per cardiologist   Plaquenil [Hydroxychloroquine Sulfate]    Hydroxychloroquine Rash     HOME MEDICATIONS: Outpatient Medications Prior to Visit  Medication Sig Dispense Refill   amLODipine (NORVASC) 2.5 MG tablet Take 1 tablet (2.5 mg total) by mouth daily. 90 tablet 3   aspirin 81 MG tablet Take 81 mg by mouth daily.     brimonidine (ALPHAGAN) 0.15 % ophthalmic solution 1 drop 2 (two) times daily.     calcium carbonate (OSCAL) 1500 (600 Ca) MG TABS tablet Take 0.5 tablets (750 mg total) by mouth daily with breakfast. 45 tablet 3   citalopram (CELEXA) 20 MG tablet Take 1 tablet (20 mg total) by mouth daily. 90 tablet 3   fenofibrate 54 MG tablet Take 1 tablet (54 mg total) by mouth daily. 90 tablet 1   levETIRAcetam (KEPPRA) 250 MG tablet Take 1 tablet (250 mg total) by mouth 2 (two) times daily. Please keep upcoming appt for further refills 60 tablet 2   losartan (COZAAR) 50 MG tablet Take 1 tablet (50 mg total) by mouth daily. 90 tablet 3   memantine (NAMENDA) 10 MG tablet Take 1 tablet (10 mg total) by mouth 2 (two) times daily. 60 tablet 5   Multiple Vitamins-Calcium (ONE-A-DAY WOMENS PO) Take by mouth daily.     pravastatin (PRAVACHOL) 20 MG tablet Take 1 tablet (20 mg total) by mouth at bedtime. 90  tablet 3   XELPROS 0.005 % EMUL Apply 1 drop to eye daily.     No facility-administered medications prior to visit.     PAST MEDICAL HISTORY: Past Medical History:  Diagnosis Date   Abnormal EEG 01/14/12   started on Keppra   Dermatomyositis (HCC)    remission on pred until 2008, rheum at [facility]   GERD (gastroesophageal reflux disease)    agravated with Fosamax   Glaucoma    Hypertension 2013   Memory loss    PMB (postmenopausal bleeding) 08/2003   Seizures (HCC)    Status post dilation of esophageal narrowing    Urinary incontinence, mixed 07/01/11  Fitted for 2.75 " Ring Pessary with support   Vitamin D deficiency disease      PAST SURGICAL HISTORY: Past Surgical History:  Procedure Laterality Date   COLONOSCOPY  09/25/04   Dr. Loreta Ave   COLONOSCOPY  02/2009   ENDOMETRIAL BIOPSY  08/24/03   weak proliferative endo, [facility] neg.   ESOPHAGOGASTRODUODENOSCOPY ENDOSCOPY  04/29/04   anemia without cause   NO PAST SURGERIES       FAMILY HISTORY: Family History  Problem Relation Age of Onset   Dementia Father        died age 53, otherwise healthy   Hypertension Mother    Heart disease Maternal Grandfather    Heart disease Maternal Grandmother    Seizures Neg Hx      SOCIAL HISTORY: Social History   Socioeconomic History   Marital status: Married    Spouse name: [name]   Number of children: 1   Years of education: hs   Highest education level: Not on file  Occupational History   Occupation: housewife  Tobacco Use   Smoking status: Former    Packs/day: 1.00    Years: 15.00    Pack years: 15.00    Types: Cigarettes    Quit date: 02/04/1976    Years since quitting: 44.9   Smokeless tobacco: Never  Vaping Use   Vaping Use: Never used  Substance and Sexual Activity   Alcohol use: Yes    Alcohol/week: 7.0 standard drinks    Types: 7 Glasses of wine per week    Comment: 1 glass of wine per day   Drug use: No   Sexual activity: Never    Partners: Male     Birth control/protection: Post-menopausal  Other Topics Concern   Not on file  Social History Narrative   Married, lives with spouse [name]).   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   Patient has one child.   Patient is right-handed.   Patient drinks 2-3 cups of coffee daily and maybe half cup at night.               Social Determinants of Health   Financial Resource Strain: Not on file  Food Insecurity: Not on file  Transportation Needs: Not on file  Physical Activity: Not on file  Stress: Not on file  Social Connections: Not on file  Intimate Partner Violence: Not on file      PHYSICAL EXAM  Vitals:   12/25/20 1409  BP: 123/66  Pulse: (!) 59  Weight: 103 lb 8 oz (46.9 kg)  Height: 5\' 1"  (1.549 m)    Body mass index is 19.56 kg/m.   Generalized: Well developed, in no acute distress  Cardiology: normal rate and rhythm, no murmur auscultated  Respiratory: clear to auscultation bilaterally    Neurological examination  Mentation: Alert , not oriented to time, oriented to city and state, oriented to most history taking. Follows all commands speech and language fluent Cranial nerve II-XII: Pupils were equal round reactive to light. Extraocular movements were full, visual field were full on confrontational test. Facial sensation and strength were normal. Head turning and shoulder shrug  were normal and symmetric. Motor: The motor testing reveals 5 over 5 strength of all 4 extremities. Good symmetric motor tone is noted throughout.  Sensory: Sensory testing is intact to soft touch on all 4 extremities. No evidence of extinction is noted.  Coordination: Cerebellar testing reveals good  finger-nose-finger and heel-to-shin bilaterally.  Gait and station: Gait is short and wide with external rotation noted of bilateral feet, reduced arm swing, tandem not attempted. Gait is stable without assistive device but she  does hold on to Mr Smiths arm for stability.  Reflexes: Deep tendon reflexes are symmetric and normal bilaterally.     DIAGNOSTIC DATA (LABS, IMAGING, TESTING) - I reviewed patient records, labs, notes, testing and imaging myself where available.  Lab Results  Component Value Date   WBC 9.2 03/03/2019   HGB 13.6 03/03/2019   HCT 39.4 03/03/2019   MCV 94.2 03/03/2019   PLT 271.0 03/03/2019      Component Value Date/Time   NA 139 05/08/2020 0918   NA 140 09/08/2017 1437   K 4.4 05/08/2020 0918   CL 105 05/08/2020 0918   CO2 28 05/08/2020 0918   GLUCOSE 87 05/08/2020 0918   BUN 24 (H) 05/08/2020 0918   BUN 19 09/08/2017 1437   CREATININE 0.62 05/08/2020 0918   CALCIUM 9.2 05/08/2020 0918   PROT 7.3 05/08/2020 0918   PROT 7.5 02/07/2016 1256   ALBUMIN 4.2 05/08/2020 0918   ALBUMIN 4.5 02/07/2016 1256   AST 32 05/08/2020 0918   ALT 26 05/08/2020 0918   ALKPHOS 63 05/08/2020 0918   BILITOT 0.6 05/08/2020 0918   BILITOT 0.5 02/07/2016 1256   GFRNONAA 91 09/08/2017 1437   GFRAA 104 09/08/2017 1437   Lab Results  Component Value Date   CHOL 153 11/06/2020   HDL 52.20 11/06/2020   LDLCALC 82 11/06/2020   LDLDIRECT 129.0 02/09/2017   TRIG 94.0 11/06/2020   CHOLHDL 3 11/06/2020   No results found for: HGBA1C Lab Results  Component Value Date   VITAMINB12 983 02/07/2016   Lab Results  Component Value Date   TSH 1.83 11/06/2020   MMSE - Mini Mental State Exam 11/23/2018 02/11/2018 03/25/2017  Orientation to time 2 2 5   Orientation to Place 3 3 5   Registration 3 3 3   Attention/ Calculation 2 4 5   Recall 1 2 0  Language- name 2 objects 2 2 2   Language- repeat 1 0 1  Language- follow 3 step command 3 3 3   Language- read & follow direction 1 1 1   Write a sentence 1 1 1   Copy design 1 0 1  Copy design-comments named 5 animals - -  Total score 20 21 27      ASSESSMENT AND PLAN  75 y.o. year old female  has a past medical history of Abnormal EEG (01/14/12),  Dermatomyositis (HCC), GERD (gastroesophageal reflux disease), Glaucoma, Hypertension (2013), Memory loss, PMB (postmenopausal bleeding) (08/2003), Seizures (HCC), Status post dilation of esophageal narrowing, Urinary incontinence, mixed (07/01/11), and Vitamin D deficiency disease. here with   Seizure disorder [facility])  Memory disturbance  April Chung is doing well. We will continue levetiracetam 250mg  BID and memantine 10mg  BID. MMSE deferred as it will not change treatment plan and makes her anxious. Healthy lifestyle habits encouraged. Memory compensation strategies reviewed. She will continue close follow up with PCP. Return to see neurology in 1 year.    , MSN, FNP-C 12/25/2020, 2:13 PM  [facility] [address], [address] [place], 14/11/13 09-03-1972 [phone]

## 2020-12-24 NOTE — Patient Instructions (Signed)
Below is our plan:  We will continue levetiracetam 250mg  and memantine 10mg  twice daily.   Please make sure you are staying well hydrated. I recommend 50-60 ounces daily. Well balanced diet and regular exercise encouraged. Consistent sleep schedule with 6-8 hours recommended.   Please continue follow up with care team as directed.   Follow up with me in 1 year   You may receive a survey regarding today's visit. I encourage you to leave honest feed back as I do use this information to improve patient care. Thank you for seeing me today!   Management of Memory Problems   There are some general things you can do to help manage your memory problems.  Your memory may not in fact recover, but by using techniques and strategies you will be able to manage your memory difficulties better.   1)  Establish a routine. Try to establish and then stick to a regular routine.  By doing this, you will get used to what to expect and you will reduce the need to rely on your memory.  Also, try to do things at the same time of day, such as taking your medication or checking your calendar first thing in the morning. Think about think that you can do as a part of a regular routine and make a list.  Then enter them into a daily planner to remind you.  This will help you establish a routine.   2)  Organize your environment. Organize your environment so that it is uncluttered.  Decrease visual stimulation.  Place everyday items such as keys or cell phone in the same place every day (ie.  Basket next to front door) Use post it notes with a brief message to yourself (ie. Turn off light, lock the door) Use labels to indicate where things go (ie. Which cupboards are for food, dishes, etc.) Keep a notepad and pen by the telephone to take messages   3)  Memory Aids A diary or journal/notebook/daily planner Making a list (shopping list, chore list, to do list that needs to be done) Using an alarm as a reminder (kitchen  timer or cell phone alarm) Using cell phone to store information (Notes, Calendar, Reminders) Calendar/White board placed in a prominent position Post-it notes   In order for memory aids to be useful, you need to have good habits.  It's no good remembering to make a note in your journal if you don't remember to look in it.  Try setting aside a certain time of day to look in journal.   4)  Improving mood and managing fatigue. There may be other factors that contribute to memory difficulties.  Factors, such as anxiety, depression and tiredness can affect memory. Regular gentle exercise can help improve your mood and give you more energy. Simple relaxation techniques may help relieve symptoms of anxiety Try to get back to completing activities or hobbies you enjoyed doing in the past. Learn to pace yourself through activities to decrease fatigue. Find out about some local support groups where you can share experiences with others. Try and achieve 7-8 hours of sleep at night.

## 2020-12-25 ENCOUNTER — Encounter: Payer: Self-pay | Admitting: Family Medicine

## 2020-12-25 ENCOUNTER — Other Ambulatory Visit: Payer: Self-pay

## 2020-12-25 ENCOUNTER — Ambulatory Visit: Payer: Medicare Other | Admitting: Family Medicine

## 2020-12-25 VITALS — BP 123/66 | HR 59 | Ht 61.0 in | Wt 103.5 lb

## 2020-12-25 DIAGNOSIS — R413 Other amnesia: Secondary | ICD-10-CM

## 2020-12-25 DIAGNOSIS — G40909 Epilepsy, unspecified, not intractable, without status epilepticus: Secondary | ICD-10-CM

## 2020-12-25 MED ORDER — MEMANTINE HCL 10 MG PO TABS: 10.0000 mg | ORAL_TABLET | Freq: Two times a day (BID) | ORAL | 3 refills | Status: DC

## 2020-12-25 MED ORDER — LEVETIRACETAM 250 MG PO TABS: 250.0000 mg | ORAL_TABLET | Freq: Two times a day (BID) | ORAL | 3 refills | Status: DC

## 2021-02-05 ENCOUNTER — Telehealth: Payer: Self-pay | Admitting: Nurse Practitioner

## 2021-02-05 DIAGNOSIS — E781 Pure hyperglyceridemia: Secondary | ICD-10-CM

## 2021-04-08 ENCOUNTER — Other Ambulatory Visit: Payer: Self-pay | Admitting: Nurse Practitioner

## 2021-04-08 DIAGNOSIS — E781 Pure hyperglyceridemia: Secondary | ICD-10-CM

## 2021-05-07 ENCOUNTER — Encounter: Payer: Self-pay | Admitting: Nurse Practitioner

## 2021-05-07 ENCOUNTER — Ambulatory Visit (INDEPENDENT_AMBULATORY_CARE_PROVIDER_SITE_OTHER): Payer: Medicare Other | Admitting: Nurse Practitioner

## 2021-05-07 VITALS — BP 114/60 | HR 64 | Temp 97.1°F | Ht 61.0 in | Wt 101.6 lb

## 2021-05-07 DIAGNOSIS — G40909 Epilepsy, unspecified, not intractable, without status epilepticus: Secondary | ICD-10-CM | POA: Diagnosis not present

## 2021-05-07 DIAGNOSIS — I700 Atherosclerosis of aorta: Secondary | ICD-10-CM | POA: Diagnosis not present

## 2021-05-07 DIAGNOSIS — I10 Essential (primary) hypertension: Secondary | ICD-10-CM

## 2021-05-07 DIAGNOSIS — F3342 Major depressive disorder, recurrent, in full remission: Secondary | ICD-10-CM | POA: Diagnosis not present

## 2021-05-07 DIAGNOSIS — E781 Pure hyperglyceridemia: Secondary | ICD-10-CM

## 2021-05-07 MED ORDER — PRAVASTATIN SODIUM 20 MG PO TABS: 20.0000 mg | ORAL_TABLET | Freq: Every day | ORAL | 3 refills | Status: DC

## 2021-05-07 MED ORDER — FENOFIBRATE 54 MG PO TABS: 54.0000 mg | ORAL_TABLET | Freq: Every day | ORAL | 3 refills | Status: DC

## 2021-05-07 NOTE — Assessment & Plan Note (Signed)
Stable mood with celexa ?No adverve effects ?

## 2021-05-07 NOTE — Assessment & Plan Note (Addendum)
No seizure activity in last 1year ?Under the care of neurology: Shawnie Dapper, NP ?Current use of keppra. ?

## 2021-05-07 NOTE — Assessment & Plan Note (Addendum)
Per CXR 07/2016 ?BP at goal ?No DM or tobacco use ?LDL at goal with pravastatin and fenofibrate ?

## 2021-05-07 NOTE — Progress Notes (Signed)
? ?             Established Patient Visit ? ?Patient: April Chung   DOB: 1945-02-28   76 y.o. Female  MRN: [id number] ?Visit Date: 05/07/2021 ? ?Subjective:  ?  ?Chief Complaint  ?Patient presents with  ? Follow-up  ?  6 month f/u on HTN.   ? ?HPI ?Seizure disorder (HCC) ?No seizure activity in last 1year ?Under the care of neurology: Shawnie Dapper, NP ?Current use of keppra. ? ?Recurrent major depressive disorder (HCC) ?Stable mood with celexa ?No adverve effects ? ?Aortic atherosclerosis (HCC) ?Per CXR 07/2016 ?BP at goal ?No DM or tobacco use ?LDL at goal with pravastatin and fenofibrate ?Walking daily ?No falls ? ?  05/07/2021  ?  2:46 PM 11/06/2020  ?  9:58 AM 11/06/2020  ?  9:57 AM  ?Depression screen PHQ 2/9  ?Decreased Interest 0 0 0  ?Down, Depressed, Hopeless 0 0 0  ?PHQ - 2 Score 0 0 0  ?Altered sleeping 0 0   ?Tired, decreased energy 0 0   ?Change in appetite 0 0   ?Feeling bad or failure about yourself  0 0   ?Trouble concentrating 0 0   ?Moving slowly or fidgety/restless 0 0   ?Suicidal thoughts 0 0   ?PHQ-9 Score 0 0   ?  ? ?  05/07/2021  ?  2:46 PM 11/06/2020  ?  9:57 AM 02/09/2017  ? 10:39 AM  ?GAD 7 : Generalized Anxiety Score  ?Nervous, Anxious, on Edge 0 0 0  ?Control/stop worrying 0 0 0  ?Worry too much - different things 0 0 0  ?Trouble relaxing 0 0 0  ?Restless 0 0 0  ?Easily annoyed or irritable 0 0 0  ?Afraid - awful might happen 0 0 0  ?Total GAD 7 Score 0 0 0  ? ?Reviewed medical, surgical, and social history today ? ?Medications: ?Outpatient Medications Prior to Visit  ?Medication Sig  ? amLODipine (NORVASC) 2.5 MG tablet Take 1 tablet (2.5 mg total) by mouth daily.  ? aspirin 81 MG tablet Take 81 mg by mouth daily.  ? brimonidine (ALPHAGAN) 0.15 % ophthalmic solution 1 drop 2 (two) times daily.  ? calcium carbonate (OSCAL) 1500 (600 Ca) MG TABS tablet Take 0.5 tablets (750 mg total) by mouth daily with breakfast.  ? citalopram (CELEXA) 20 MG tablet Take 1 tablet (20 mg total) by mouth daily.  ?  levETIRAcetam (KEPPRA) 250 MG tablet Take 1 tablet (250 mg total) by mouth 2 (two) times daily.  ? losartan (COZAAR) 50 MG tablet Take 1 tablet (50 mg total) by mouth daily.  ? memantine (NAMENDA) 10 MG tablet Take 1 tablet (10 mg total) by mouth 2 (two) times daily.  ? Multiple Vitamins-Calcium (ONE-A-DAY WOMENS PO) Take by mouth daily.  ? XELPROS 0.005 % EMUL Apply 1 drop to eye daily.  ? [DISCONTINUED] fenofibrate 54 MG tablet TAKE 1 TABLET BY MOUTH DAILY  ? [DISCONTINUED] pravastatin (PRAVACHOL) 20 MG tablet TAKE 1 TABLET BY MOUTH AT BEDTIME  ? ?No facility-administered medications prior to visit.  ? ?Reviewed past medical and social history.  ? ?ROS per HPI above ? ? ?   ?Objective:  ?BP 114/60 (BP Location: Left Arm, Patient Position: Sitting, Cuff Size: Normal)   Pulse 64   Temp (!) 97.1 ?F (36.2 ?C) (Temporal)   Ht 5\' 1"  (1.549 m)   Wt 101 lb 9.6 oz (46.1 kg)   LMP 08/03/1993 (Approximate)   SpO2  98%   BMI 19.20 kg/m?  ? ?  ? ?Physical Exam  ?No results found for any visits on 05/07/21. ?   ?Assessment & Plan:  ?  ?Problem List Items Addressed This Visit   ? ?  ? Cardiovascular and Mediastinum  ? Aortic atherosclerosis (HCC)  ?  Per CXR 07/2016 ?BP at goal ?No DM or tobacco use ?LDL at goal with pravastatin and fenofibrate ?  ?  ? Relevant Medications  ? fenofibrate 54 MG tablet  ? pravastatin (PRAVACHOL) 20 MG tablet  ? Hypertension - Primary  ? Relevant Medications  ? fenofibrate 54 MG tablet  ? pravastatin (PRAVACHOL) 20 MG tablet  ?  ? Nervous and Auditory  ? Seizure disorder (HCC)  ?  No seizure activity in last 1year ?Under the care of neurology: Shawnie Dapper, NP ?Current use of keppra. ?  ?  ?  ? Other  ? Hypertriglyceridemia  ? Relevant Medications  ? fenofibrate 54 MG tablet  ? pravastatin (PRAVACHOL) 20 MG tablet  ? Recurrent major depressive disorder (HCC)  ?  Stable mood with celexa ?No adverve effects ?  ?  ? ?Return in about 6 months (around 11/06/2021) for HTN and, hyperlipidemia  (fasting). ? ?  ? ?Alysia Penna, NP ? ? ?

## 2021-05-07 NOTE — Patient Instructions (Addendum)
Maintain current medications  

## 2021-06-21 ENCOUNTER — Telehealth: Payer: Self-pay | Admitting: Nurse Practitioner

## 2021-06-21 NOTE — Telephone Encounter (Signed)
Needs nurse visit for PPD skin test. Attach medication and problem list for completed form

## 2021-06-21 NOTE — Telephone Encounter (Signed)
Appt scheduled

## 2021-06-25 ENCOUNTER — Ambulatory Visit (INDEPENDENT_AMBULATORY_CARE_PROVIDER_SITE_OTHER): Payer: Medicare Other

## 2021-06-25 DIAGNOSIS — Z111 Encounter for screening for respiratory tuberculosis: Secondary | ICD-10-CM | POA: Diagnosis not present

## 2021-06-25 NOTE — Progress Notes (Signed)
Patient is here for TB skin test. TB skin test administered in left forearm. Advised patient to return on 05/25 after 2:22 pm or on 05/26 before 2:22. Patient verbalized understanding.

## 2021-06-27 ENCOUNTER — Ambulatory Visit (INDEPENDENT_AMBULATORY_CARE_PROVIDER_SITE_OTHER): Payer: Medicare Other

## 2021-06-27 DIAGNOSIS — Z111 Encounter for screening for respiratory tuberculosis: Secondary | ICD-10-CM

## 2021-06-27 LAB — TB SKIN TEST
Induration: 0 mm
TB Skin Test: NEGATIVE

## 2021-06-27 NOTE — Progress Notes (Signed)
PPD Reading Note PPD read and results entered in EpicCare. Result: 0 mm induration. Interpretation: Negative If test not read within 48-72 hours of initial placement, patient advised to repeat in other arm 1-3 weeks after this test. Allergic reaction: no  Deiontae Rabel, RMA  

## 2021-06-27 NOTE — Addendum Note (Signed)
Addended by: Renaee Munda on: 06/27/2021 02:51 PM   Modules accepted: Orders

## 2021-08-29 ENCOUNTER — Ambulatory Visit (INDEPENDENT_AMBULATORY_CARE_PROVIDER_SITE_OTHER): Payer: Medicare Other

## 2021-08-29 DIAGNOSIS — Z0000 Encounter for general adult medical examination without abnormal findings: Secondary | ICD-10-CM

## 2021-08-29 NOTE — Progress Notes (Signed)
Subjective:   April Chung is a 76 y.o. female who presents for Medicare Annual (Subsequent) preventive examination.  I connected with Honestee Revard today by phone and verified that I am speaking with the correct person using  two identifiers. DOB and name. Location patient: home Location provider: work Persons participatin in the virtual visit: patient, husband, Rosanne Ashing, and Mickle Plumb, [place]     Annual Wellness Visit     Patient: April Chung, Female    DOB: 08/18/1945, 76 y.o.   MRN: [id number]  Subjective  No chief complaint on file.   April Chung is a 76 y.o. female who presents today for her Annual Wellness Visit. She reports consuming a general diet. The patient does not participate in regular exercise at present. She generally feels well. She reports sleeping well. She does not have additional problems to discuss today.   HPI N/A      Medications: Outpatient Medications Prior to Visit  Medication Sig   amLODipine (NORVASC) 2.5 MG tablet Take 1 tablet (2.5 mg total) by mouth daily.   aspirin 81 MG tablet Take 81 mg by mouth daily.   brimonidine (ALPHAGAN) 0.15 % ophthalmic solution 1 drop 2 (two) times daily.   calcium carbonate (OSCAL) 1500 (600 Ca) MG TABS tablet Take 0.5 tablets (750 mg total) by mouth daily with breakfast.   citalopram (CELEXA) 20 MG tablet Take 1 tablet (20 mg total) by mouth daily.   fenofibrate 54 MG tablet Take 1 tablet (54 mg total) by mouth daily.   levETIRAcetam (KEPPRA) 250 MG tablet Take 1 tablet (250 mg total) by mouth 2 (two) times daily.   losartan (COZAAR) 50 MG tablet Take 1 tablet (50 mg total) by mouth daily.   memantine (NAMENDA) 10 MG tablet Take 1 tablet (10 mg total) by mouth 2 (two) times daily.   Multiple Vitamins-Calcium (ONE-A-DAY WOMENS PO) Take by mouth daily.   pravastatin (PRAVACHOL) 20 MG tablet Take 1 tablet (20 mg total) by mouth at bedtime.   XELPROS 0.005 % EMUL Apply 1 drop to eye daily.   No  facility-administered medications prior to visit.    Allergies  Allergen Reactions   Aricept [Donepezil Hcl] Other (See Comments)    Low heart rate, do not take per cardiologist   Plaquenil [Hydroxychloroquine Sulfate]    Hydroxychloroquine Rash    Patient Care Team: Nche, Bonna Gains, NP as PCP - General (Internal Medicine) Romine, Edwena Felty, MD (Obstetrics and Gynecology) [place], Heather, OD (Optometry) Hilarie Fredrickson, MD (Gastroenterology) York Spaniel, MD (Inactive) (Neurology)  ROS  none done       Objective  LMP 08/03/1993 (Approximate)    Physical Exam  NA    Most recent functional status assessment:    05/07/2021    1:39 PM  In your present state of health, do you have any difficulty performing the following activities:  Hearing? 0  Vision? 0  Difficulty concentrating or making decisions? 0  Walking or climbing stairs? 0  Dressing or bathing? 0  Doing errands, shopping? 0   Most recent fall risk assessment:    05/07/2021    1:39 PM  Fall Risk   Falls in the past year? 0  Number falls in past yr: 0  Injury with Fall? 0  Risk for fall due to : No Fall Risks  Follow up Falls evaluation completed    Most recent depression screenings:    05/07/2021    2:46 PM 11/06/2020  9:58 AM  PHQ 2/9 Scores  PHQ - 2 Score 0 0  PHQ- 9 Score 0 0   Most recent cognitive screening:     No data to display         Most recent Audit-C alcohol use screening    08/25/2019    2:39 PM  Alcohol Use Disorder Test (AUDIT)  1. How often do you have a drink containing alcohol? 4  2. How many drinks containing alcohol do you have on a typical day when you are drinking? 0  3. How often do you have six or more drinks on one occasion? 0  AUDIT-C Score 4   A score of 3 or more in women, and 4 or more in men indicates increased risk for alcohol abuse, EXCEPT if all of the points are from question 1   Vision/Hearing Screen: No results found.    No results found for  any visits on 08/29/21.    Assessment & Plan   Annual wellness visit done today including the all of the following: Reviewed patient's Family Medical History Reviewed and updated list of patient's medical providers Assessment of cognitive impairment was done Assessed patient's functional ability Established a written schedule for health screening services Health Risk Assessent Completed and Reviewed  Exercise Activities and Dietary recommendations  Goals      Maintain current active lifestyle.        Immunization History  Administered Date(s) Administered   Fluad Quad(high Dose 65+) 11/03/2018, 11/06/2020   Influenza Split 11/04/2010, 12/17/2015, 11/11/2016   Influenza, High Dose Seasonal PF 12/16/2014, 11/08/2016   Influenza-Unspecified 11/17/2013, 12/04/2017   Moderna Sars-Covid-2 Vaccination 03/07/2019, 03/28/2019   PPD Test 06/25/2021   Pneumococcal Conjugate-13 12/13/2013   Pneumococcal Polysaccharide-23 11/12/2011   Tdap 08/24/2012   Zoster Recombinat (Shingrix) 11/21/2020    Health Maintenance  Topic Date Due   COVID-19 Vaccine (3 - Moderna series) 05/23/2019   Zoster Vaccines- Shingrix (2 of 2) 01/16/2021   INFLUENZA VACCINE  09/03/2021   TETANUS/TDAP  08/25/2022   Pneumonia Vaccine 75+ Years old  Completed   DEXA SCAN  Completed   Hepatitis C Screening  Completed   HPV VACCINES  Aged Out   COLONOSCOPY (Pts 45-33yrs Insurance coverage will need to be confirmed)  Discontinued     Discussed health benefits of physical activity, and encouraged her to engage in regular exercise appropriate for her age and condition.    Problem List Items Addressed This Visit   None Visit Diagnoses     Encounter for Medicare annual wellness exam    -  Primary       No follow-ups on file.     Waymond Cera, CMA     Review of Systems    None done        Objective:    There were no vitals filed for this visit. There is no height or weight on file to  calculate BMI.     08/25/2019    2:28 PM 03/25/2017    9:12 AM 07/12/2016    6:47 PM 08/02/2014   11:38 AM  Advanced Directives  Does Patient Have a Medical Advance Directive? No;Yes Yes No Yes  Type of Estate agent of [place];Living will Healthcare Power of [place];Living will  Healthcare Power of [place];Living will  Copy of Healthcare Power of Attorney in Chart? No - copy requested No - copy requested  No - copy requested  Would patient like information on creating a medical  advance directive?   No - Patient declined     Current Medications (verified) Outpatient Encounter Medications as of 08/29/2021  Medication Sig   amLODipine (NORVASC) 2.5 MG tablet Take 1 tablet (2.5 mg total) by mouth daily.   aspirin 81 MG tablet Take 81 mg by mouth daily.   brimonidine (ALPHAGAN) 0.15 % ophthalmic solution 1 drop 2 (two) times daily.   calcium carbonate (OSCAL) 1500 (600 Ca) MG TABS tablet Take 0.5 tablets (750 mg total) by mouth daily with breakfast.   citalopram (CELEXA) 20 MG tablet Take 1 tablet (20 mg total) by mouth daily.   fenofibrate 54 MG tablet Take 1 tablet (54 mg total) by mouth daily.   levETIRAcetam (KEPPRA) 250 MG tablet Take 1 tablet (250 mg total) by mouth 2 (two) times daily.   losartan (COZAAR) 50 MG tablet Take 1 tablet (50 mg total) by mouth daily.   memantine (NAMENDA) 10 MG tablet Take 1 tablet (10 mg total) by mouth 2 (two) times daily.   Multiple Vitamins-Calcium (ONE-A-DAY WOMENS PO) Take by mouth daily.   pravastatin (PRAVACHOL) 20 MG tablet Take 1 tablet (20 mg total) by mouth at bedtime.   XELPROS 0.005 % EMUL Apply 1 drop to eye daily.   No facility-administered encounter medications on file as of 08/29/2021.    Allergies (verified) Aricept [donepezil hcl], Plaquenil [hydroxychloroquine sulfate], and Hydroxychloroquine   History: Past Medical History:  Diagnosis Date   Abnormal EEG 01/14/12   started on Keppra   Dermatomyositis (HCC)     remission on pred until 2008, rheum at [facility]   GERD (gastroesophageal reflux disease)    agravated with Fosamax   Glaucoma    Hypertension 2013   Memory loss    PMB (postmenopausal bleeding) 08/2003   Seizures (HCC)    Status post dilation of esophageal narrowing    Urinary incontinence, mixed 07/01/11   Fitted for 2.75 " Ring Pessary with support   Vitamin D deficiency disease    Past Surgical History:  Procedure Laterality Date   COLONOSCOPY  09/25/04   Dr. Loreta Ave   COLONOSCOPY  02/2009   ENDOMETRIAL BIOPSY  08/24/03   weak proliferative endo, SH[facility] neg.   ESOPHAGOGASTRODUODENOSCOPY ENDOSCOPY  04/29/04   anemia without cause   NO PAST SURGERIES     Family History  Problem Relation Age of Onset   Dementia Father        died age 77, otherwise healthy   Hypertension Mother    Heart disease Maternal Grandfather    Heart disease Maternal Grandmother    Seizures Neg Hx    Social History   Socioeconomic History   Marital status: Married    Spouse name: [name]   Number of children: 1   Years of education: hs   Highest education level: Not on file  Occupational History   Occupation: housewife  Tobacco Use   Smoking status: Former    Packs/day: 1.00    Years: 15.00    Total pack years: 15.00    Types: Cigarettes    Quit date: 02/04/1976    Years since quitting: 45.5   Smokeless tobacco: Never  Vaping Use   Vaping Use: Never used  Substance and Sexual Activity   Alcohol use: Yes    Alcohol/week: 7.0 standard drinks of alcohol    Types: 7 Glasses of wine per week    Comment: 1 glass of wine per day   Drug use: No   Sexual activity: Never  Partners: Male    Birth control/protection: Post-menopausal  Other Topics Concern   Not on file  Social History Narrative   Married, lives with spouse [name]).   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   Patient has one child.   Patient is right-handed.    Patient drinks 2-3 cups of coffee daily and maybe half cup at night.               Social Determinants of Health   Financial Resource Strain: Low Risk  (08/25/2019)   Overall Financial Resource Strain (CARDIA)    Difficulty of Paying Living Expenses: Not hard at all  Food Insecurity: Not on file  Transportation Needs: No Transportation Needs (08/25/2019)   PRAPARE - Administrator, Civil Service (Medical): No    Lack of Transportation (Non-Medical): No  Physical Activity: Insufficiently Active (08/25/2019)   Exercise Vital Sign    Days of Exercise per Week: 3 days    Minutes of Exercise per Session: 10 min  Stress: Not on file  Social Connections: Moderately Integrated (08/25/2019)   Social Connection and Isolation Panel [NHANES]    Frequency of Communication with Friends and Family: More than three times a week    Frequency of Social Gatherings with Friends and Family: More than three times a week    Attends Religious Services: Never    Database administrator or Organizations: Yes    Attends Banker Meetings: Never    Marital Status: Married    Tobacco Counseling Counseling given: Not Answered   Clinical Intake:                 Diabetic?No         Activities of Daily Living    05/07/2021    1:39 PM  In your present state of health, do you have any difficulty performing the following activities:  Hearing? 0  Vision? 0  Difficulty concentrating or making decisions? 0  Walking or climbing stairs? 0  Dressing or bathing? 0  Doing errands, shopping? 0    Patient Care Team: Nche, Bonna Gains, NP as PCP - General (Internal Medicine) Romine, Edwena Felty, MD (Obstetrics and Gynecology) [place], Heather, [place] (Optometry) Hilarie Fredrickson, MD (Gastroenterology) York Spaniel, MD (Inactive) (Neurology)  Indicate any recent Medical Services you may have received from other than [facility] providers in the past year (date may be approximate).      Assessment:   This is a routine wellness examination for April Chung.  Hearing/Vision screen No results found.  Dietary issues and exercise activities discussed:     Goals Addressed   None    Depression Screen    05/07/2021    2:46 PM 11/06/2020    9:58 AM 11/06/2020    9:57 AM 08/25/2019    2:30 PM 02/24/2019   10:33 AM 06/08/2018    2:25 PM 03/25/2017    9:12 AM  PHQ 2/9 Scores  PHQ - 2 Score 0 0 0 0 0 0 0  PHQ- 9 Score 0 0         Fall Risk    05/07/2021    1:39 PM 05/02/2020    2:07 PM 08/25/2019    2:29 PM 02/24/2019   10:32 AM 06/08/2018    2:24 PM  Fall Risk   Falls in the past year? 0 0 0 0 1  Number falls in past yr: 0 0  0  0  Injury with Fall? 0 0 0  0  Risk for fall due to : No Fall Risks      Follow up Falls evaluation completed  Falls prevention discussed      FALL RISK PREVENTION PERTAINING TO THE HOME:  Any stairs in or around the home? No  If so, are there any without handrails?  na Home free of loose throw rugs in walkways, pet beds, electrical cords, etc? yes No  Adequate lighting in your home to reduce risk of falls? No   ASSISTIVE DEVICES UTILIZED TO PREVENT FALLS:  Life alert? Yes  Use of a cane, walker or w/c? No  Grab bars in the bathroom? Yes  Shower chair or bench in shower? Yes  Elevated toilet seat or a handicapped toilet? Yes   TIMED UP AND GO:  Was the test performed? No .  Length of time to ambulate 10 feet: NA sec.     Cognitive Function:    11/23/2018    2:32 PM 02/11/2018    1:28 PM 03/25/2017    9:33 AM 02/10/2017   12:04 PM 07/28/2016   10:35 AM  MMSE - Mini Mental State Exam  Orientation to time 2 2 5 5 3   Orientation to Place 3 3 5 5 5   Registration 3 3 3 3 3   Attention/ Calculation 2 4 5 5 5   Recall 1 2 0 3 3  Language- name 2 objects 2 2 2 2 2   Language- repeat 1 0 1 1 1   Language- follow 3 step command 3 3 3 3 3   Language- read & follow direction 1 1 1 1 1   Write a sentence 1 1 1 1 1   Copy design 1 0 1 1 1   Copy  design-comments named 5 animals      Total score 20 21 27 30 28         Immunizations Immunization History  Administered Date(s) Administered   Fluad Quad(high Dose 65+) 11/03/2018, 11/06/2020   Influenza Split 11/04/2010, 12/17/2015, 11/11/2016   Influenza, High Dose Seasonal PF 12/16/2014, 11/08/2016   Influenza-Unspecified 11/17/2013, 12/04/2017   Moderna Sars-Covid-2 Vaccination 03/07/2019, 03/28/2019   PPD Test 06/25/2021   Pneumococcal Conjugate-13 12/13/2013   Pneumococcal Polysaccharide-23 11/12/2011   Tdap 08/24/2012   Zoster Recombinat (Shingrix) 11/21/2020    TDAP status: Up to date  Flu Vaccine status: Up to date  Pneumococcal vaccine status: Up to date  Covid-19 vaccine status: Completed vaccines  Qualifies for Shingles Vaccine? Yes   Zostavax completed No   Shingrix Completed?: No.    Education has been provided regarding the importance of this vaccine. Patient has been advised to call insurance company to determine out of pocket expense if they have not yet received this vaccine. Advised may also receive vaccine at local pharmacy or Health Dept. Verbalized acceptance and understanding.  Screening Tests Health Maintenance  Topic Date Due   COVID-19 Vaccine (3 - Moderna series) 05/23/2019   Zoster Vaccines- Shingrix (2 of 2) 01/16/2021   INFLUENZA VACCINE  09/03/2021   TETANUS/TDAP  08/25/2022   Pneumonia Vaccine 51+ Years old  Completed   DEXA SCAN  Completed   Hepatitis C Screening  Completed   HPV VACCINES  Aged Out   COLONOSCOPY (Pts 45-30yrs Insurance coverage will need to be confirmed)  Discontinued    Health Maintenance  Health Maintenance Due  Topic Date Due   COVID-19 Vaccine (3 - Moderna series) 05/23/2019   Zoster Vaccines- Shingrix (2  of 2) 01/16/2021    Colorectal cancer screening: Type of screening: Colonoscopy. Completed 03/29/2010. Repeat every 10 years  declines anymore  Mammogram status: Completed 01/30/2016. Repeat every  year  Bone Density:  done 04/26/19; Osteoporosis repeat 2 years  Lung Cancer Screening: (Low Dose CT Chest recommended if Age 37-80 years, 30 pack-year currently smoking OR have quit w/in 15years.) does not qualify.   Lung Cancer Screening Referral: na  Additional Screening:  Hepatitis C Screening: does not qualify; Completed  Vision Screening: Recommended annual ophthalmology exams for early detection of glaucoma and other disorders of the eye. Is the patient up to date with their annual eye exam?  Yes  Who is the provider or what is the name of the office in which the patient attends annual eye exams? Dr Heather [place] If pt is not established with a provider, would they like to be referred to a provider to establish care? No .   Dental Screening: Recommended annual dental exams for proper oral hygiene  Community Resource Referral / Chronic Care Management: CRR required this visit?  No   CCM required this visit?  No      Plan:     I have personally reviewed and noted the following in the patient's chart:   Medical and social history Use of alcohol, tobacco or illicit drugs  Current medications and supplements including opioid prescriptions.  Functional ability and status Nutritional status Physical activity Advanced directives List of other physicians Hospitalizations, surgeries, and ER visits in previous 12 months Vitals Screenings to include cognitive, depression, and falls Referrals and appointments  In addition, I have reviewed and discussed with patient certain preventive protocols, quality metrics, and best practice recommendations. A written personalized care plan for preventive services as well as general preventive health recommendations were provided to patient.     Waymond CeraDenise L Lionell Matuszak, CMA   08/29/2021   Nurse Notes: non face to face  40 min encounter    April Chung , Thank you for taking time to come for your Medicare Wellness Visit. I appreciate your  ongoing commitment to your health goals. Please review the following plan we discussed and let me know if I can assist you in the future.   These are the goals we discussed:  Goals      Maintain current active lifestyle.        This is a list of the screening recommended for you and due dates:  Health Maintenance  Topic Date Due   COVID-19 Vaccine (3 - Moderna series) 05/23/2019   Zoster (Shingles) Vaccine (2 of 2) 01/16/2021   Flu Shot  09/03/2021   Tetanus Vaccine  08/25/2022   Pneumonia Vaccine  Completed   DEXA scan (bone density measurement)  Completed   Hepatitis C Screening: USPSTF Recommendation to screen - Ages 4418-79 yo.  Completed   HPV Vaccine  Aged Out   Colon Cancer Screening  Discontinued

## 2021-10-09 ENCOUNTER — Other Ambulatory Visit: Payer: Self-pay | Admitting: Nurse Practitioner

## 2021-10-09 DIAGNOSIS — E559 Vitamin D deficiency, unspecified: Secondary | ICD-10-CM

## 2021-10-09 NOTE — Telephone Encounter (Signed)
Chart supports Rx Last OV: 05/2021 Next OV: not scheduled   

## 2021-11-07 ENCOUNTER — Other Ambulatory Visit: Payer: Self-pay | Admitting: Nurse Practitioner

## 2021-11-07 DIAGNOSIS — I10 Essential (primary) hypertension: Secondary | ICD-10-CM

## 2021-11-07 DIAGNOSIS — F3342 Major depressive disorder, recurrent, in full remission: Secondary | ICD-10-CM

## 2021-11-07 NOTE — Telephone Encounter (Signed)
Chart supports Rx Last OV: 05/2021 Next OV: not scheduled   

## 2021-11-11 ENCOUNTER — Telehealth: Payer: Self-pay | Admitting: *Deleted

## 2021-11-11 NOTE — Telephone Encounter (Signed)
Received a fax from [facility] stating patient received the following vaccinations on 11/07/2021:  April Chung [id number] Syringe Left arm IM 0.5 mL Reported to Immunization Registry 2. Spikevax 2023-2024 (12y Up) Syringe  Left arm  IM  0.5 mL  Reported to Immunization Registry  Report has been sent to medical records for scanning

## 2021-11-26 ENCOUNTER — Other Ambulatory Visit: Payer: Self-pay

## 2021-11-26 DIAGNOSIS — G40909 Epilepsy, unspecified, not intractable, without status epilepticus: Secondary | ICD-10-CM

## 2021-11-27 MED ORDER — LEVETIRACETAM 250 MG PO TABS: 250.0000 mg | ORAL_TABLET | Freq: Two times a day (BID) | ORAL | 0 refills | Status: DC

## 2021-11-27 MED ORDER — MEMANTINE HCL 10 MG PO TABS: 10.0000 mg | ORAL_TABLET | Freq: Two times a day (BID) | ORAL | 0 refills | Status: DC

## 2021-12-30 NOTE — Progress Notes (Signed)
Chief Complaint  Patient presents with   Follow-up    RM 10, with husband.    HISTORY OF PRESENT ILLNESS:  12/31/21 April Chung returns for follow up for seizures and memory loss. She continues levetiracetam 250mg  BID and memantine 10mg  BID. She is doing well. No significant changes since last year. She and her husband now live in [employer] independent living. She is a little less active outside but continues to stay active around her home. She is able to perform ADLs. She does not drive. Memory is stable. No seizures.   12/25/2020 ALL: April Chung returns for follow up for seizures and memory loss. She continues levetiracetam 250mg  BID and memantine 10mg  BID. She continues to do well. She is tolerating meds. Memory is stable. No significant changes. She denies falls. Appetite is good. She has lost about 3 pounds over the year but contributes this to eating healthier foods. She continues to perform ADLs independently. She is able to manage her own medications. She walks her dogs every day and tries to walk around in the yard when she can. She enjoys crossword puzzles. She drinks about 1/2 glass of merlot every night.   12/21/2019 ALL:  April Chung is a 76 y.o. female here today for follow up for seizures and memory loss. She continues levetiracetam 250mg  BID and Namenda 10mg  BID. She presents with her husband who aids in history. She feels she is doing well and husband agrees. She continues to be fairly independent. April Chung helps with meal preparation and medications. She is able to perform ADL's. She is less active than she used to be but continues to walk daily. No seizure activity.    HISTORY (copied from previous note)  April Chung is a 76 y.o. female here today for follow up of seizure ans memory loss. She continues Keppra 250mg  twice daily. She is also taking Namenda 10mg  twice daily. She was unable to tolerate Aricept due to low heart rate.  Overall, she feels that she is doing very  well.  Her husband is with her today and agrees.  They feel that memory is fairly stable.  She is able to perform ADLs independently.  She does not drive.  She is not as active as she used to be.  Her husband mentions that walking has become much slower and pace.  He states that she seems stable in balance but moves her feet much slower than she used to.  He denies shuffling or stooped posture.  He has considered having her to use a walking stick but is concerned that she will not use it.  There have been no falls.  No strokelike symptoms.  No seizure activity.   She and her husband deny concerns of elevated blood pressures at home.  They admit that they do not routinely check.  Blood pressure was normal in the office with Megan in January/2020.  Initial blood pressure reading in office today was 182/100.  At completion of visit we recheck blood pressure and it is 151/77.  Her husband feels that normal blood pressure readings are 130s over 70s to 80s.  She is followed by primary care for hypertension.  She is currently taking Cozaar 50 mg daily as well as Norvasc 2.5 mg daily.  She denies headaches, dizziness, chest pain or trouble breathing.   HISTORY: (copied from [place] note on 02/11/2018)   April Chung is a 76 year old right handed female who presents today for follow  up regarding seizures and memory loss. She is taking Namenda 10mg  twice daily. She had to stop Aricept due to decreased heart rate. April Chung states that he has not been able to tell much of a difference since stopping Aricept with the exception of slower movements. She is tolerating Namenda well. MMSE today is 21/30. In 03/2017 she scored 27/30. She continues to perform all ADL's independently. April Chung works during the day and she is doing well staying home alone. He does assist with dosing medications. She has become reclusive and typically does not like to go out much per her husband.   She is taking Keppra 250mg  twice daily. She  denies seizure like activity. She is tolerating Keppra well without excessive drowsiness.    HISTORY: (copied from Dr Anne Hahn' note from 02/10/2017)   April Chung is a 76 year old right-handed white female with a history of a mild memory disturbance and history of seizures.  The patient is on Aricept taking 10 mg daily, she is on Namenda taking 10 mg twice daily.  She tolerates these medications well.  She is on low-dose Keppra, she tolerates this drug also without drowsiness.  She has not had any recurring seizures since last seen, she does operate a motor vehicle without difficulty.  The patient has not noted any progression of balance issues, she denies any progression of memory.  She returns to this office for an evaluation    REVIEW OF SYSTEMS: Out of a complete 14 system review of symptoms, the patient complains only of the following symptoms, memory loss and all other reviewed systems are negative.   ALLERGIES: Allergies  Allergen Reactions   Aricept [Donepezil Hcl] Other (See Comments)    Low heart rate, do not take per cardiologist   Plaquenil [Hydroxychloroquine Sulfate]    Hydroxychloroquine Rash     HOME MEDICATIONS: Outpatient Medications Prior to Visit  Medication Sig Dispense Refill   amLODipine (NORVASC) 2.5 MG tablet TAKE 1 TABLET BY MOUTH DAILY 90 tablet 3   aspirin 81 MG tablet Take 81 mg by mouth daily.     brimonidine (ALPHAGAN) 0.15 % ophthalmic solution 1 drop 2 (two) times daily.     citalopram (CELEXA) 20 MG tablet TAKE 1 TABLET BY MOUTH EVERY DAY 90 tablet 3   fenofibrate 54 MG tablet Take 1 tablet (54 mg total) by mouth daily. 90 tablet 3   losartan (COZAAR) 50 MG tablet TAKE 1 TABLET BY MOUTH DAILY 90 tablet 3   Multiple Vitamins-Calcium (ONE-A-DAY WOMENS PO) Take by mouth daily.     pravastatin (PRAVACHOL) 20 MG tablet Take 1 tablet (20 mg total) by mouth at bedtime. 90 tablet 3   XELPROS 0.005 % EMUL Apply 1 drop to eye daily.     levETIRAcetam (KEPPRA) 250  MG tablet Take 1 tablet (250 mg total) by mouth 2 (two) times daily. 180 tablet 0   memantine (NAMENDA) 10 MG tablet Take 1 tablet (10 mg total) by mouth 2 (two) times daily. 180 tablet 0   calcium carbonate (OSCAL) 1500 (600 Ca) MG TABS tablet TAKE 1/2 TABLET BY MOUTH EVERY DAY WITH BREAKFAST 90 tablet 3   No facility-administered medications prior to visit.     PAST MEDICAL HISTORY: Past Medical History:  Diagnosis Date   Abnormal EEG 01/14/12   started on Keppra   Dermatomyositis (HCC)    remission on pred until 2008, rheum at [facility]   GERD (gastroesophageal reflux disease)    agravated with Fosamax  Glaucoma    Hypertension 2013   Memory loss    PMB (postmenopausal bleeding) 08/2003   Seizures (HCC)    Status post dilation of esophageal narrowing    Urinary incontinence, mixed 07/01/11   Fitted for 2.75 " Ring Pessary with support   Vitamin D deficiency disease      PAST SURGICAL HISTORY: Past Surgical History:  Procedure Laterality Date   COLONOSCOPY  09/25/04   Dr. Loreta Ave   COLONOSCOPY  02/2009   ENDOMETRIAL BIOPSY  08/24/03   weak proliferative endo, [facility] neg.   ESOPHAGOGASTRODUODENOSCOPY ENDOSCOPY  04/29/04   anemia without cause   NO PAST SURGERIES       FAMILY HISTORY: Family History  Problem Relation Age of Onset   Dementia Father        died age 3, otherwise healthy   Hypertension Mother    Heart disease Maternal Grandfather    Heart disease Maternal Grandmother    Seizures Neg Hx      SOCIAL HISTORY: Social History   Socioeconomic History   Marital status: Married    Spouse name: April Chung   Number of children: 1   Years of education: hs   Highest education level: Not on file  Occupational History   Occupation: housewife  Tobacco Use   Smoking status: Former    Packs/day: 1.00    Years: 15.00    Total pack years: 15.00    Types: Cigarettes    Quit date: 02/04/1976    Years since quitting: 45.9   Smokeless tobacco: Never  Vaping Use    Vaping Use: Never used  Substance and Sexual Activity   Alcohol use: Not Currently    Alcohol/week: 7.0 standard drinks of alcohol    Types: 7 Glasses of wine per week    Comment: not actively   Drug use: No   Sexual activity: Never    Partners: Male    Birth control/protection: Post-menopausal  Other Topics Concern   Not on file  Social History Narrative   Married, lives with spouse April Chung).   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   Patient has one child.   Patient is right-handed.   Patient drinks 2-3 cups of coffee daily and maybe half cup at night.               Social Determinants of Health   Financial Resource Strain: Low Risk  (08/25/2019)   Overall Financial Resource Strain (CARDIA)    Difficulty of Paying Living Expenses: Not hard at all  Food Insecurity: Not on file  Transportation Needs: No Transportation Needs (08/25/2019)   PRAPARE - Administrator, Civil Service (Medical): No    Lack of Transportation (Non-Medical): No  Physical Activity: Insufficiently Active (08/25/2019)   Exercise Vital Sign    Days of Exercise per Week: 3 days    Minutes of Exercise per Session: 10 min  Stress: Not on file  Social Connections: Moderately Integrated (08/25/2019)   Social Connection and Isolation Panel [NHANES]    Frequency of Communication with Friends and Family: More than three times a week    Frequency of Social Gatherings with Friends and Family: More than three times a week    Attends Religious Services: Never    Database administrator or Organizations: Yes    Attends Banker Meetings: Never    Marital Status: Married  Catering manager Violence: Not on file  PHYSICAL EXAM  Vitals:   12/31/21 1349  BP: (!) 147/74  Pulse: 70  Weight: 106 lb (48.1 kg)  Height: 5' (1.524 m)     Body mass index is 20.7 kg/m.   Generalized: Well developed, in no acute  distress  Cardiology: normal rate and rhythm, no murmur auscultated  Respiratory: clear to auscultation bilaterally    Neurological examination  Mentation: Alert , not oriented to time, oriented to city and state, oriented to most history taking. Follows all commands speech and language fluent Cranial nerve II-XII: Pupils were equal round reactive to light. Extraocular movements were full, visual field were full on confrontational test. Facial sensation and strength were normal. Head turning and shoulder shrug  were normal and symmetric. Motor: The motor testing reveals 5 over 5 strength of all 4 extremities. Good symmetric motor tone is noted throughout.  Sensory: Sensory testing is intact to soft touch on all 4 extremities. No evidence of extinction is noted.  Coordination: Cerebellar testing reveals good finger-nose-finger and heel-to-shin bilaterally.  Gait and station: Gait is short and wide with external rotation noted of bilateral feet, reduced arm swing, tandem not attempted. Gait is stable without assistive device but she does hold on to April Chung arm for stability.  Reflexes: Deep tendon reflexes are symmetric and normal bilaterally.     DIAGNOSTIC DATA (LABS, IMAGING, TESTING) - I reviewed patient records, labs, notes, testing and imaging myself where available.  Lab Results  Component Value Date   WBC 9.2 03/03/2019   HGB 13.6 03/03/2019   HCT 39.4 03/03/2019   MCV 94.2 03/03/2019   PLT 271.0 03/03/2019      Component Value Date/Time   NA 139 05/08/2020 0918   NA 140 09/08/2017 1437   K 4.4 05/08/2020 0918   CL 105 05/08/2020 0918   CO2 28 05/08/2020 0918   GLUCOSE 87 05/08/2020 0918   BUN 24 (H) 05/08/2020 0918   BUN 19 09/08/2017 1437   CREATININE 0.62 05/08/2020 0918   CALCIUM 9.2 05/08/2020 0918   PROT 7.3 05/08/2020 0918   PROT 7.5 02/07/2016 1256   ALBUMIN 4.2 05/08/2020 0918   ALBUMIN 4.5 02/07/2016 1256   AST 32 05/08/2020 0918   ALT 26 05/08/2020 0918    ALKPHOS 63 05/08/2020 0918   BILITOT 0.6 05/08/2020 0918   BILITOT 0.5 02/07/2016 1256   GFRNONAA 91 09/08/2017 1437   GFRAA 104 09/08/2017 1437   Lab Results  Component Value Date   CHOL 153 11/06/2020   HDL 52.20 11/06/2020   LDLCALC 82 11/06/2020   LDLDIRECT 129.0 02/09/2017   TRIG 94.0 11/06/2020   CHOLHDL 3 11/06/2020   No results found for: "HGBA1C" Lab Results  Component Value Date   VITAMINB12 983 02/07/2016   Lab Results  Component Value Date   TSH 1.83 11/06/2020      12/31/2021    2:00 PM 08/29/2021    2:18 PM 11/23/2018    2:32 PM  MMSE - Mini Mental State Exam  Not completed: Unable to complete Unable to complete   Orientation to time   2  Orientation to Place   3  Registration   3  Attention/ Calculation   2  Recall   1  Language- name 2 objects   2  Language- repeat   1  Language- follow 3 step command   3  Language- read & follow direction   1  Write a sentence   1  Copy design   1  Copy design-comments   named 5 animals  Total score   20     ASSESSMENT AND PLAN  76 y.o. year old female  has a past medical history of Abnormal EEG (01/14/12), Dermatomyositis (HCC), GERD (gastroesophageal reflux disease), Glaucoma, Hypertension (2013), Memory loss, PMB (postmenopausal bleeding) (08/2003), Seizures (HCC), Status post dilation of esophageal narrowing, Urinary incontinence, mixed (07/01/11), and Vitamin D deficiency disease. here with   Moderate dementia with anxiety, unspecified dementia type (HCC)  Seizure disorder (HCC) - Plan: levETIRAcetam (KEPPRA) 250 MG tablet  Memory disturbance  Marivel is doing well. We will continue levetiracetam 250mg  BID and memantine 10mg  BID. MMSE deferred as it will not change treatment plan and makes her anxious. Healthy lifestyle habits encouraged. Memory compensation strategies reviewed. She will continue close follow up with PCP. Return to see neurology in 1 year.    Shawnie Dapper, MSN, FNP-C 12/31/2021, 2:49  PM  [facility] [address], [address] [place], [place] [address] [phone]

## 2021-12-31 ENCOUNTER — Ambulatory Visit: Payer: Medicare Other | Admitting: Family Medicine

## 2021-12-31 ENCOUNTER — Encounter: Payer: Self-pay | Admitting: Family Medicine

## 2021-12-31 VITALS — BP 147/74 | HR 70 | Ht 60.0 in | Wt 106.0 lb

## 2021-12-31 DIAGNOSIS — R413 Other amnesia: Secondary | ICD-10-CM | POA: Diagnosis not present

## 2021-12-31 DIAGNOSIS — G40909 Epilepsy, unspecified, not intractable, without status epilepticus: Secondary | ICD-10-CM

## 2021-12-31 DIAGNOSIS — F03B4 Unspecified dementia, moderate, with anxiety: Secondary | ICD-10-CM

## 2021-12-31 MED ORDER — MEMANTINE HCL 10 MG PO TABS: 10.0000 mg | ORAL_TABLET | Freq: Two times a day (BID) | ORAL | 3 refills | Status: DC

## 2021-12-31 MED ORDER — LEVETIRACETAM 250 MG PO TABS: 250.0000 mg | ORAL_TABLET | Freq: Two times a day (BID) | ORAL | 3 refills | Status: DC

## 2021-12-31 NOTE — Patient Instructions (Signed)
Below is our plan:  We will continue levetiracetam 250mg  twice daily and memantine 10mg  twice daily   Please make sure you are staying well hydrated. I recommend 50-60 ounces daily. Well balanced diet and regular exercise encouraged. Consistent sleep schedule with 6-8 hours recommended.   Please continue follow up with care team as directed.   Follow up with me in 1 year   You may receive a survey regarding today's visit. I encourage you to leave honest feed back as I do use this information to improve patient care. Thank you for seeing me today!   Management of Memory Problems   There are some general things you can do to help manage your memory problems.  Your memory may not in fact recover, but by using techniques and strategies you will be able to manage your memory difficulties better.   1)  Establish a routine. Try to establish and then stick to a regular routine.  By doing this, you will get used to what to expect and you will reduce the need to rely on your memory.  Also, try to do things at the same time of day, such as taking your medication or checking your calendar first thing in the morning. Think about think that you can do as a part of a regular routine and make a list.  Then enter them into a daily planner to remind you.  This will help you establish a routine.   2)  Organize your environment. Organize your environment so that it is uncluttered.  Decrease visual stimulation.  Place everyday items such as keys or cell phone in the same place every day (ie.  Basket next to front door) Use post it notes with a brief message to yourself (ie. Turn off light, lock the door) Use labels to indicate where things go (ie. Which cupboards are for food, dishes, etc.) Keep a notepad and pen by the telephone to take messages   3)  Memory Aids A diary or journal/notebook/daily planner Making a list (shopping list, chore list, to do list that needs to be done) Using an alarm as a reminder  (kitchen timer or cell phone alarm) Using cell phone to store information (Notes, Calendar, Reminders) Calendar/White board placed in a prominent position Post-it notes   In order for memory aids to be useful, you need to have good habits.  It's no good remembering to make a note in your journal if you don't remember to look in it.  Try setting aside a certain time of day to look in journal.   4)  Improving mood and managing fatigue. There may be other factors that contribute to memory difficulties.  Factors, such as anxiety, depression and tiredness can affect memory. Regular gentle exercise can help improve your mood and give you more energy. Exercise: there are short videos created by the on Health specially for older adults: https://bit.ly/2I30q97.  Mediterranean diet: which emphasizes fruits, vegetables, whole grains, legumes, fish, and other seafood; unsaturated fats such as olive oils; and low amounts of red meat, eggs, and sweets. A variation of this, called MIND (Mediterranean-DASH Intervention for Neurodegenerative Delay) incorporates the DASH (Dietary Approaches to Stop Hypertension) diet, which has been shown to lower high blood pressure, a risk factor for Alzheimer's disease. More information at: .  Aerobic exercise that improve heart health is also good for the mind.  [employer] on Aging have short videos for exercises that you can do at home: ExitMarketing.de Simple relaxation  techniques may help relieve symptoms of anxiety Try to get back to completing activities or hobbies you enjoyed doing in the past. Learn to pace yourself through activities to decrease fatigue. Find out about some local support groups where you can share experiences with others. Try and achieve 7-8 hours of sleep at night.   Resources for Family/Caregiver  Online caregiver support  groups can be found at LDLive.be or call Alzheimer's Association's 24/7 hotline: (579)839-1941. [facility] offers in-person, virtual support groups and individual counseling for both care partners and persons with memory loss. Call for more information at ([phone].   Advanced care plan: there are two types of Power of Attorney: healthcare and durable. Healthcare POA is a designated person to make healthcare decisions on your behalf if you were too sick to make them yourself. This person can be selected and documented by your physician. Durable POA has to be set up with a lawyer who takes charge of your finances and estate if you were too sick or cognitively impaired to manage your finances accurately. You can find a local Elder Engineer, mining here: ToyShower.it.  Check out www.planyourlifespan.org, which will help you plan before a crisis and decide who will take care of life considerations in a circumstance where you may not be able to speak for yourself.   Helpful books (available on [employer] or your local bookstore):  By Dr. Army Chaco: Keeping Love Alive as Memories Fade: The 5 Love Languages and the Alzheimer's Journey Nov 04, 2014 The Dementia Care Partner's Workbook: A Guide for Understanding, Education, and [employer] - July 04, 2017.  Both available for less than $15.   "Coping with behavior change in dementia: a family caregiver's guide" by [place] "A Caregiver's Guide to Dementia: Using Activities and Other Strategies to Prevent, Reduce and Manage Behavioral Symptoms" by Osie Bond Gitlin and [employer].  Arts development officer of Joy for the Person with Alzheimer's or Dementia" 4th edition by Vance Peper  Caregiver videos on common behaviors related to dementia: quierodirigir.com  [place] Caregiver Portal: free to sign up, links to local resources: https://[email or web address]-caregivers.com/login

## 2022-01-14 DIAGNOSIS — H401132 Primary open-angle glaucoma, bilateral, moderate stage: Secondary | ICD-10-CM | POA: Diagnosis not present

## 2022-05-08 ENCOUNTER — Other Ambulatory Visit: Payer: Self-pay | Admitting: Nurse Practitioner

## 2022-05-08 DIAGNOSIS — E781 Pure hyperglyceridemia: Secondary | ICD-10-CM

## 2022-05-08 DIAGNOSIS — I700 Atherosclerosis of aorta: Secondary | ICD-10-CM

## 2022-05-15 ENCOUNTER — Other Ambulatory Visit: Payer: Self-pay | Admitting: Nurse Practitioner

## 2022-05-15 DIAGNOSIS — I700 Atherosclerosis of aorta: Secondary | ICD-10-CM

## 2022-05-15 DIAGNOSIS — E781 Pure hyperglyceridemia: Secondary | ICD-10-CM

## 2022-05-15 MED ORDER — PRAVASTATIN SODIUM 20 MG PO TABS
20.0000 mg | ORAL_TABLET | Freq: Every day | ORAL | 0 refills | Status: DC
Start: 2022-05-15 — End: 2022-08-15

## 2022-05-15 MED ORDER — FENOFIBRATE 54 MG PO TABS
54.0000 mg | ORAL_TABLET | Freq: Every day | ORAL | 0 refills | Status: DC
Start: 2022-05-15 — End: 2022-08-15

## 2022-05-15 NOTE — Telephone Encounter (Signed)
Called Jim(Husband) back and he was trying to refill the pravastatin and fenofibrate and it was denied because she needed an office visit.  He will be having surgery 4/19 and will not be able to bring her in for about a month afterwards.  He asked if we can refill this until he is able to bring her in.

## 2022-05-15 NOTE — Telephone Encounter (Signed)
Pt needs appointment

## 2022-06-18 DIAGNOSIS — H401132 Primary open-angle glaucoma, bilateral, moderate stage: Secondary | ICD-10-CM | POA: Diagnosis not present

## 2022-07-03 DIAGNOSIS — K080 Exfoliation of teeth due to systemic causes: Secondary | ICD-10-CM | POA: Diagnosis not present

## 2022-07-28 ENCOUNTER — Other Ambulatory Visit: Payer: Self-pay | Admitting: Nurse Practitioner

## 2022-07-28 DIAGNOSIS — E781 Pure hyperglyceridemia: Secondary | ICD-10-CM

## 2022-07-28 DIAGNOSIS — I700 Atherosclerosis of aorta: Secondary | ICD-10-CM

## 2022-08-13 NOTE — Telephone Encounter (Signed)
Pt needs appointment

## 2022-08-14 ENCOUNTER — Other Ambulatory Visit: Payer: Self-pay | Admitting: Nurse Practitioner

## 2022-08-14 DIAGNOSIS — E781 Pure hyperglyceridemia: Secondary | ICD-10-CM

## 2022-08-14 DIAGNOSIS — I700 Atherosclerosis of aorta: Secondary | ICD-10-CM

## 2022-08-15 ENCOUNTER — Other Ambulatory Visit: Payer: Self-pay | Admitting: Nurse Practitioner

## 2022-08-15 DIAGNOSIS — I700 Atherosclerosis of aorta: Secondary | ICD-10-CM

## 2022-08-15 DIAGNOSIS — E781 Pure hyperglyceridemia: Secondary | ICD-10-CM

## 2022-08-29 ENCOUNTER — Ambulatory Visit (INDEPENDENT_AMBULATORY_CARE_PROVIDER_SITE_OTHER): Payer: Medicare Other | Admitting: Nurse Practitioner

## 2022-08-29 ENCOUNTER — Encounter: Payer: Self-pay | Admitting: Nurse Practitioner

## 2022-08-29 VITALS — BP 112/80 | HR 65 | Temp 98.5°F | Resp 16 | Ht 60.0 in | Wt 95.6 lb

## 2022-08-29 DIAGNOSIS — G40909 Epilepsy, unspecified, not intractable, without status epilepticus: Secondary | ICD-10-CM | POA: Diagnosis not present

## 2022-08-29 DIAGNOSIS — I951 Orthostatic hypotension: Secondary | ICD-10-CM | POA: Diagnosis not present

## 2022-08-29 DIAGNOSIS — I700 Atherosclerosis of aorta: Secondary | ICD-10-CM

## 2022-08-29 DIAGNOSIS — R634 Abnormal weight loss: Secondary | ICD-10-CM | POA: Diagnosis not present

## 2022-08-29 DIAGNOSIS — E781 Pure hyperglyceridemia: Secondary | ICD-10-CM

## 2022-08-29 DIAGNOSIS — F03B4 Unspecified dementia, moderate, with anxiety: Secondary | ICD-10-CM

## 2022-08-29 DIAGNOSIS — I10 Essential (primary) hypertension: Secondary | ICD-10-CM

## 2022-08-29 DIAGNOSIS — F3342 Major depressive disorder, recurrent, in full remission: Secondary | ICD-10-CM | POA: Diagnosis not present

## 2022-08-29 DIAGNOSIS — M85851 Other specified disorders of bone density and structure, right thigh: Secondary | ICD-10-CM

## 2022-08-29 LAB — COMPREHENSIVE METABOLIC PANEL
ALT: 14 U/L (ref 0–35)
AST: 18 U/L (ref 0–37)
Albumin: 4.2 g/dL (ref 3.5–5.2)
Alkaline Phosphatase: 45 U/L (ref 39–117)
BUN: 24 mg/dL — ABNORMAL HIGH (ref 6–23)
CO2: 29 mEq/L (ref 19–32)
Calcium: 9.6 mg/dL (ref 8.4–10.5)
Chloride: 106 mEq/L (ref 96–112)
Creatinine, Ser: 0.83 mg/dL (ref 0.40–1.20)
GFR: 67.02 mL/min (ref >60.00)
Glucose, Bld: 138 mg/dL — ABNORMAL HIGH (ref 70–99)
Potassium: 4.2 mEq/L (ref 3.5–5.1)
Sodium: 142 mEq/L (ref 135–145)
Total Bilirubin: 0.5 mg/dL (ref 0.2–1.2)
Total Protein: 7 g/dL (ref 6.0–8.3)

## 2022-08-29 LAB — TSH: TSH: 1.18 u[IU]/mL (ref 0.35–5.50)

## 2022-08-29 LAB — CBC WITH DIFFERENTIAL/PLATELET
Basophils Absolute: 0 10*3/uL (ref 0.0–0.1)
Basophils Relative: 0.5 % (ref 0.0–3.0)
Eosinophils Absolute: 0.1 10*3/uL (ref 0.0–0.7)
Eosinophils Relative: 1 % (ref 0.0–5.0)
HCT: 38.2 % (ref 36.0–46.0)
Hemoglobin: 12.8 g/dL (ref 12.0–15.0)
Lymphocytes Relative: 16.9 % (ref 12.0–46.0)
Lymphs Abs: 1.2 10*3/uL (ref 0.7–4.0)
MCHC: 33.5 g/dL (ref 30.0–36.0)
MCV: 95.1 fl (ref 78.0–100.0)
Monocytes Absolute: 0.5 10*3/uL (ref 0.1–1.0)
Monocytes Relative: 7.1 % (ref 3.0–12.0)
Neutro Abs: 5.4 10*3/uL (ref 1.4–7.7)
Neutrophils Relative %: 74.5 % (ref 43.0–77.0)
Platelets: 299 10*3/uL (ref 150.0–400.0)
RBC: 4.01 Mil/uL (ref 3.87–5.11)
RDW: 12.6 % (ref 11.5–15.5)
WBC: 7.3 10*3/uL (ref 4.0–10.5)

## 2022-08-29 LAB — LIPID PANEL
Cholesterol: 140 mg/dL (ref 0–200)
HDL: 52 mg/dL (ref >39.00)
LDL Cholesterol: 73 mg/dL (ref 0–99)
NonHDL: 88.05
Total CHOL/HDL Ratio: 3
Triglycerides: 76 mg/dL (ref 0.0–149.0)
VLDL: 15.2 mg/dL (ref 0.0–40.0)

## 2022-08-29 MED ORDER — AMLODIPINE BESYLATE 2.5 MG PO TABS
2.5000 mg | ORAL_TABLET | Freq: Every day | ORAL | 3 refills | Status: DC
Start: 2022-08-29 — End: 2022-11-13

## 2022-08-29 MED ORDER — PRAVASTATIN SODIUM 20 MG PO TABS
20.0000 mg | ORAL_TABLET | Freq: Every day | ORAL | 0 refills | Status: DC
Start: 2022-08-29 — End: 2022-11-13

## 2022-08-29 MED ORDER — LOSARTAN POTASSIUM 50 MG PO TABS
50.0000 mg | ORAL_TABLET | Freq: Every day | ORAL | 3 refills | Status: DC
Start: 2022-08-29 — End: 2022-11-13

## 2022-08-29 MED ORDER — FENOFIBRATE 54 MG PO TABS
54.0000 mg | ORAL_TABLET | Freq: Every day | ORAL | 0 refills | Status: DC
Start: 2022-08-29 — End: 2022-11-13

## 2022-08-29 MED ORDER — CITALOPRAM HYDROBROMIDE 20 MG PO TABS
20.0000 mg | ORAL_TABLET | Freq: Every day | ORAL | 3 refills | Status: DC
Start: 2022-08-29 — End: 2022-11-13

## 2022-08-29 NOTE — Assessment & Plan Note (Signed)
Current use of calcium and vit D OVER THE COUNTER dose. Last dexa scan 2021:  The BMD measured at Femur Neck Right is 0.702 g/cm2 with a T-score of -2.4 Encourage weight bearing exercise and/or resistant training with band or chair exercise

## 2022-08-29 NOTE — Assessment & Plan Note (Signed)
Stable mood with celexa ?No adverve effects ?

## 2022-08-29 NOTE — Assessment & Plan Note (Signed)
Denies any adverse effects with fenofibrate and pravastatin. Repeat lipid panel today

## 2022-08-29 NOTE — Assessment & Plan Note (Signed)
She reported dizziness with change in position from laying to sitting and sitting to standing. Check BP from sitting to standing: 80/60 (symptomatic) Husband reports at least 4 16oz bottles of water daily. He denies any syncopal episode at home. She currently take losartan 50mg  and amlodipine 2.5mg  in Am. No BP check at home.  Check CBC, BMP, THYROID today Advised to Monitor BP every morning Take losartan in AM and amlodipine in PM Hold amlodipine and losartan if BP <120/70 Hold amlodipine only if BP <110/70

## 2022-08-29 NOTE — Assessment & Plan Note (Signed)
No seizure activity in last 1year ?Under the care of neurology: April Dapper, NP ?Current use of keppra. ?

## 2022-08-29 NOTE — Progress Notes (Signed)
Established Patient Visit  Patient: April Chung   DOB: [birth year]   77 y.o. Female  MRN: [id number] Visit Date: 08/29/2022  Subjective:    Chief Complaint  Patient presents with   Medication Refill   Accompanied by husband-Mr Katrinka Blazing  Seizure disorder [facility]) No seizure activity in last 1year Under the care of neurology: Shawnie Dapper, NP Current use of keppra.  Recurrent major depressive disorder (HCC) Stable mood with celexa No adverve effects  Osteopenia Current use of calcium and vit D OVER THE COUNTER dose. Last dexa scan 2021:  The BMD measured at Femur Neck Right is 0.702 g/cm2 with a T-score of -2.4 Encourage weight bearing exercise and/or resistant training with band or chair exercise  Moderate dementia with anxiety, unspecified dementia type (HCC) Under the care of neurology Current use of namenda Lives in an Assisted Living facility with husband. He denies any change in mental status. She is able to complete ADLs independently but needs supervision. Has shuffling gait, no change per husband. He also denies any falls in last 1year  Hypercholesterolemia Denies any adverse effects with fenofibrate and pravastatin. Repeat lipid panel today  Aortic atherosclerosis (HCC) BP at goal No DM or tobacco use Current use of pravastatin and fenofibrate Repeat lipid panel  Orthostatic hypotension She reported dizziness with change in position from laying to sitting and sitting to standing. Check BP from sitting to standing: 80/60 (symptomatic) Husband reports at least 4 16oz bottles of water daily. He denies any syncopal episode at home. She currently take losartan 50mg  and amlodipine 2.5mg  in Am. No BP check at home.  Check CBC, BMP, THYROID today Advised to Monitor BP every morning Take losartan in AM and amlodipine in PM Hold amlodipine and losartan if BP <120/70 Hold amlodipine only if BP <110/70  11lbs weight loss noted today. Husband reports she eats  aday with 1snack, no change in appetite, she denies any ABDOMEN pain or constipation or diarrhea or dysphagia or dysuria. Wt Readings from Last 3 Encounters:  08/29/22 95 lb 9.6 oz (43.4 kg)  12/31/21 106 lb (48.1 kg)  05/07/21 101 lb 9.6 oz (46.1 kg)    BP Readings from Last 3 Encounters:  08/29/22 112/80  12/31/21 (!) 147/74  05/07/21 114/60     Reviewed medical, surgical, and social history today  Medications: Outpatient Medications Prior to Visit  Medication Sig   aspirin 81 MG tablet Take 81 mg by mouth daily.   brimonidine (ALPHAGAN) 0.15 % ophthalmic solution 1 drop 2 (two) times daily.   levETIRAcetam (KEPPRA) 250 MG tablet Take 1 tablet (250 mg total) by mouth 2 (two) times daily.   memantine (NAMENDA) 10 MG tablet Take 1 tablet (10 mg total) by mouth 2 (two) times daily.   Multiple Vitamins-Calcium (ONE-A-DAY WOMENS PO) Take by mouth daily.   XELPROS 0.005 % EMUL Apply 1 drop to eye daily.   [DISCONTINUED] amLODipine (NORVASC) 2.5 MG tablet TAKE 1 TABLET BY MOUTH DAILY   [DISCONTINUED] citalopram (CELEXA) 20 MG tablet TAKE 1 TABLET BY MOUTH EVERY DAY   [DISCONTINUED] fenofibrate 54 MG tablet TAKE 1 TABLET BY MOUTH EVERY DAY   [DISCONTINUED] losartan (COZAAR) 50 MG tablet TAKE 1 TABLET BY MOUTH DAILY   [DISCONTINUED] pravastatin (PRAVACHOL) 20 MG tablet TAKE 1 TABLET BY MOUTH AT BEDTIME   No facility-administered medications prior to visit.   Reviewed past medical and social history.   ROS  per HPI above  Last CBC Lab Results  Component Value Date   WBC 9.2 03/03/2019   HGB 13.6 03/03/2019   HCT 39.4 03/03/2019   MCV 94.2 03/03/2019   MCH 31.8 07/12/2016   RDW 12.7 03/03/2019   PLT 271.0 03/03/2019   Last metabolic panel Lab Results  Component Value Date   GLUCOSE 87 05/08/2020   NA 139 05/08/2020   K 4.4 05/08/2020   CL 105 05/08/2020   CO2 28 05/08/2020   BUN 24 (H) 05/08/2020   CREATININE 0.62 05/08/2020   GFR 86.05 05/08/2020   CALCIUM 9.2  05/08/2020   PROT 7.3 05/08/2020   ALBUMIN 4.2 05/08/2020   LABGLOB 3.0 02/07/2016   AGRATIO 1.5 02/07/2016   BILITOT 0.6 05/08/2020   ALKPHOS 63 05/08/2020   AST 32 05/08/2020   ALT 26 05/08/2020   ANIONGAP 8 07/12/2016   Last lipids Lab Results  Component Value Date   CHOL 153 11/06/2020   HDL 52.20 11/06/2020   LDLCALC 82 11/06/2020   LDLDIRECT 129.0 02/09/2017   TRIG 94.0 11/06/2020   CHOLHDL 3 11/06/2020   Last hemoglobin A1c No results found for: "HGBA1C" Last thyroid functions Lab Results  Component Value Date   TSH 1.83 11/06/2020        Objective:  BP 112/80 (BP Location: Left Arm, Patient Position: Sitting, Cuff Size: Normal)   Pulse 65   Temp 98.5 F (36.9 C) (Temporal)   Resp 16   Ht 5' (1.524 m)   Wt 95 lb 9.6 oz (43.4 kg)   LMP 08/03/1993 (Approximate)   SpO2 96%   BMI 18.67 kg/m      Physical Exam Cardiovascular:     Rate and Rhythm: Normal rate and regular rhythm.     Pulses: Normal pulses.     Heart sounds: Normal heart sounds.  Pulmonary:     Effort: Pulmonary effort is normal.     Breath sounds: Normal breath sounds.  Musculoskeletal:     Right lower leg: No edema.     Left lower leg: No edema.  Neurological:     Mental Status: She is alert.     Cranial Nerves: Cranial nerves 2-12 are intact.     Motor: Motor function is intact.     Coordination: Coordination is intact.     Gait: Gait abnormal.     Deep Tendon Reflexes:     Reflex Scores:      Bicep reflexes are 2+ on the right side.      Patellar reflexes are 2+ on the right side.    Comments: Oriented to person, place and able to recognize her husband     No results found for any visits on 08/29/22.    Assessment & Plan:    Problem List Items Addressed This Visit       Cardiovascular and Mediastinum   Aortic atherosclerosis (HCC)    BP at goal No DM or tobacco use Current use of pravastatin and fenofibrate Repeat lipid panel      Relevant Medications    amLODipine (NORVASC) 2.5 MG tablet   fenofibrate 54 MG tablet   losartan (COZAAR) 50 MG tablet   pravastatin (PRAVACHOL) 20 MG tablet   Other Relevant Orders   Lipid panel   Hypertension   Relevant Medications   amLODipine (NORVASC) 2.5 MG tablet   fenofibrate 54 MG tablet   losartan (COZAAR) 50 MG tablet   pravastatin (PRAVACHOL) 20 MG tablet   Other Relevant Orders  Comprehensive metabolic panel   CBC with Differential/Platelet   Orthostatic hypotension    She reported dizziness with change in position from laying to sitting and sitting to standing. Check BP from sitting to standing: 80/60 (symptomatic) Husband reports at least 4 16oz bottles of water daily. He denies any syncopal episode at home. She currently take losartan 50mg  and amlodipine 2.5mg  in Am. No BP check at home.  Check CBC, BMP, THYROID today Advised to Monitor BP every morning Take losartan in AM and amlodipine in PM Hold amlodipine and losartan if BP <120/70 Hold amlodipine only if BP <110/70      Relevant Medications   amLODipine (NORVASC) 2.5 MG tablet   fenofibrate 54 MG tablet   losartan (COZAAR) 50 MG tablet   pravastatin (PRAVACHOL) 20 MG tablet   Other Relevant Orders   Comprehensive metabolic panel   CBC with Differential/Platelet   TSH     Nervous and Auditory   Moderate dementia with anxiety, unspecified dementia type (HCC) - Primary    Under the care of neurology Current use of namenda Lives in an Assisted Living facility with husband. He denies any change in mental status. She is able to complete ADLs independently but needs supervision. Has shuffling gait, no change per husband. He also denies any falls in last 1year      Relevant Medications   citalopram (CELEXA) 20 MG tablet   Seizure disorder (HCC)    No seizure activity in last 1year Under the care of neurology: Shawnie Dapper, NP Current use of keppra.        Musculoskeletal and Integument   Osteopenia    Current use of  calcium and vit D OVER THE COUNTER dose. Last dexa scan 2021:  The BMD measured at Femur Neck Right is 0.702 g/cm2 with a T-score of -2.4 Encourage weight bearing exercise and/or resistant training with band or chair exercise        Other   Hypercholesterolemia    Denies any adverse effects with fenofibrate and pravastatin. Repeat lipid panel today      Relevant Medications   amLODipine (NORVASC) 2.5 MG tablet   fenofibrate 54 MG tablet   losartan (COZAAR) 50 MG tablet   pravastatin (PRAVACHOL) 20 MG tablet   Recurrent major depressive disorder (HCC)    Stable mood with celexa No adverve effects      Relevant Medications   citalopram (CELEXA) 20 MG tablet   Other Visit Diagnoses     Weight loss, unintentional       Relevant Orders   TSH      Return in about 3 months (around 11/29/2022) for weight loss re eval.     Alysia Penna, NP

## 2022-08-29 NOTE — Assessment & Plan Note (Signed)
BP at goal No DM or tobacco use Current use of pravastatin and fenofibrate Repeat lipid panel

## 2022-08-29 NOTE — Assessment & Plan Note (Addendum)
Under the care of neurology Current use of namenda Lives in an Assisted Living facility with husband. He denies any change in mental status. She is able to complete ADLs independently but needs supervision. Has shuffling gait, no change per husband. He also denies any falls in last 1year

## 2022-08-29 NOTE — Patient Instructions (Signed)
Go to lab Continue Heart healthy diet and daily exercise. Monitor BP every morning Take losartan in AM and amlodipine in PM Hold amlodipine and losartan if BP <120/70 Hold amlodipine only if BP <110/70 Maintain other med doses

## 2022-09-01 ENCOUNTER — Encounter: Payer: Self-pay | Admitting: Nurse Practitioner

## 2022-09-01 ENCOUNTER — Other Ambulatory Visit: Payer: Self-pay

## 2022-09-01 DIAGNOSIS — R739 Hyperglycemia, unspecified: Secondary | ICD-10-CM

## 2022-09-01 NOTE — Addendum Note (Signed)
Addended by: Lindley Magnus L on: 09/01/2022 03:52 PM   Modules accepted: Orders

## 2022-09-01 NOTE — Addendum Note (Signed)
Addended by: Lindley Magnus L on: 09/01/2022 04:26 PM   Modules accepted: Orders

## 2022-09-08 ENCOUNTER — Encounter: Payer: Self-pay | Admitting: Nurse Practitioner

## 2022-10-16 ENCOUNTER — Other Ambulatory Visit: Payer: Self-pay | Admitting: Nurse Practitioner

## 2022-10-16 DIAGNOSIS — H401132 Primary open-angle glaucoma, bilateral, moderate stage: Secondary | ICD-10-CM | POA: Diagnosis not present

## 2022-10-16 DIAGNOSIS — E559 Vitamin D deficiency, unspecified: Secondary | ICD-10-CM

## 2022-10-25 ENCOUNTER — Other Ambulatory Visit: Payer: Self-pay | Admitting: Nurse Practitioner

## 2022-10-25 DIAGNOSIS — E559 Vitamin D deficiency, unspecified: Secondary | ICD-10-CM

## 2022-10-27 ENCOUNTER — Ambulatory Visit (INDEPENDENT_AMBULATORY_CARE_PROVIDER_SITE_OTHER): Payer: Medicare Other

## 2022-10-27 DIAGNOSIS — Z0000 Encounter for general adult medical examination without abnormal findings: Secondary | ICD-10-CM | POA: Diagnosis not present

## 2022-10-27 NOTE — Progress Notes (Signed)
Subjective:   April Chung is a 77 y.o. female who presents for Medicare Annual (Subsequent) preventive examination.  Visit Complete: Virtual  I connected with  April Chung on 10/27/22 by a audio enabled telemedicine application and verified that I am speaking with the correct person using two identifiers. Spouse was also on the call.  Patient Location: Home  Provider Location: Office/Clinic  I discussed the limitations of evaluation and management by telemedicine. The patient expressed understanding and agreed to proceed.  Vital Signs: Unable to obtain new vitals due to this being a telehealth visit.  Cardiac Risk Factors include: advanced age (>92men, >23 women)     Objective:    Today's Vitals   There is no height or weight on file to calculate BMI.     10/27/2022    3:03 PM 08/25/2019    2:28 PM 03/25/2017    9:12 AM 07/12/2016    6:47 PM 08/02/2014   11:38 AM  Advanced Directives  Does Patient Have a Medical Advance Directive? Yes No;Yes Yes No Yes  Type of Estate agent of [place];Living will Healthcare Power of [place];Living will Healthcare Power of [place];Living will  Healthcare Power of [place];Living will  Copy of Healthcare Power of Attorney in Chart? No - copy requested No - copy requested No - copy requested  No - copy requested  Would patient like information on creating a medical advance directive?    No - Patient declined     Current Medications (verified) Outpatient Encounter Medications as of 10/27/2022  Medication Sig   amLODipine (NORVASC) 2.5 MG tablet Take 1 tablet (2.5 mg total) by mouth daily.   aspirin 81 MG tablet Take 81 mg by mouth daily.   brimonidine (ALPHAGAN) 0.15 % ophthalmic solution 1 drop 2 (two) times daily.   citalopram (CELEXA) 20 MG tablet Take 1 tablet (20 mg total) by mouth daily.   fenofibrate 54 MG tablet Take 1 tablet (54 mg total) by mouth daily.   levETIRAcetam (KEPPRA) 250 MG tablet Take 1 tablet  (250 mg total) by mouth 2 (two) times daily.   losartan (COZAAR) 50 MG tablet Take 1 tablet (50 mg total) by mouth daily.   memantine (NAMENDA) 10 MG tablet Take 1 tablet (10 mg total) by mouth 2 (two) times daily.   Multiple Vitamins-Calcium (ONE-A-DAY WOMENS PO) Take by mouth daily.   pravastatin (PRAVACHOL) 20 MG tablet Take 1 tablet (20 mg total) by mouth at bedtime.   XELPROS 0.005 % EMUL Apply 1 drop to eye daily.   No facility-administered encounter medications on file as of 10/27/2022.    Allergies (verified) Aricept [donepezil hcl], Plaquenil [hydroxychloroquine sulfate], and Hydroxychloroquine   History: Past Medical History:  Diagnosis Date   Abnormal EEG 01/14/12   started on Keppra   Dermatomyositis (HCC)    remission on pred until 2008, rheum at [facility]   GERD (gastroesophageal reflux disease)    agravated with Fosamax   Glaucoma    Hypertension 2013   Memory loss    PMB (postmenopausal bleeding) 08/2003   Seizures (HCC)    Status post dilation of esophageal narrowing    Urinary incontinence, mixed 07/01/11   Fitted for 2.75 " Ring Pessary with support   Vitamin D deficiency disease    Past Surgical History:  Procedure Laterality Date   COLONOSCOPY  09/25/04   Dr. Loreta Ave   COLONOSCOPY  02/2009   ENDOMETRIAL BIOPSY  08/24/03   weak proliferative endo, [facility]  neg.   ESOPHAGOGASTRODUODENOSCOPY ENDOSCOPY  04/29/04   anemia without cause   NO PAST SURGERIES     Family History  Problem Relation Age of Onset   Dementia Father        died age 50, otherwise healthy   Hypertension Mother    Heart disease Maternal Grandfather    Heart disease Maternal Grandmother    Seizures Neg Hx    Social History   Socioeconomic History   Marital status: Married    Spouse name: April Chung   Number of children: 1   Years of education: hs   Highest education level: Associate degree: occupational, Scientist, product/process development, or vocational program  Occupational History   Occupation: housewife   Tobacco Use   Smoking status: Former    Current packs/day: 0.00    Average packs/day: 1 pack/day for 15.0 years (15.0 ttl pk-yrs)    Types: Cigarettes    Start date: 02/03/1961    Quit date: 02/04/1976    Years since quitting: 46.7   Smokeless tobacco: Never  Vaping Use   Vaping status: Never Used  Substance and Sexual Activity   Alcohol use: Not Currently    Alcohol/week: 7.0 standard drinks of alcohol    Types: 7 Glasses of wine per week    Comment: not actively   Drug use: No   Sexual activity: Never    Partners: Male    Birth control/protection: Post-menopausal  Other Topics Concern   Not on file  Social History Narrative   Married, lives with spouse April Chung).   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   Patient has one child.   Patient is right-handed.   Patient drinks 2-3 cups of coffee daily and maybe half cup at night.               Social Determinants of Health   Financial Resource Strain: Low Risk  (10/27/2022)   Overall Financial Resource Strain (CARDIA)    Difficulty of Paying Living Expenses: Not hard at all  Food Insecurity: No Food Insecurity (10/27/2022)   Hunger Vital Sign    Worried About Running Out of Food in the Last Year: Never true    Ran Out of Food in the Last Year: Never true  Transportation Needs: No Transportation Needs (10/27/2022)   PRAPARE - Administrator, Civil Service (Medical): No    Lack of Transportation (Non-Medical): No  Physical Activity: Inactive (10/27/2022)   Exercise Vital Sign    Days of Exercise per Week: 0 days    Minutes of Exercise per Session: 0 min  Stress: No Stress Concern Present (10/27/2022)   [employer] of Occupational Health - Occupational Stress Questionnaire    Feeling of Stress : Not at all  Social Connections: Moderately Isolated (10/27/2022)   Social Connection and Isolation Panel [NHANES]    Frequency of Communication with Friends  and Family: Three times a week    Frequency of Social Gatherings with Friends and Family: Once a week    Attends Religious Services: Never    Database administrator or Organizations: No    Attends Engineer, structural: Never    Marital Status: Married    Tobacco Counseling Counseling given: Not Answered   Clinical Intake:  Pre-visit preparation completed: Yes  Pain : No/denies pain     Nutritional Risks: None Diabetes: No  How often do you need to have someone help you when you  read instructions, pamphlets, or other written materials from your doctor or pharmacy?: 3 - Sometimes  Interpreter Needed?: No  Information entered by :: NAllen LPN   Activities of Daily Living    10/27/2022    2:59 PM  In your present state of health, do you have any difficulty performing the following activities:  Hearing? 0  Vision? 0  Difficulty concentrating or making decisions? 1  Comment dementia  Walking or climbing stairs? 1  Comment slow  Dressing or bathing? 0  Doing errands, shopping? 1  Comment husband attends  Quarry manager and eating ? Y  Comment husband prepares  Using the Toilet? N  In the past six months, have you accidently leaked urine? N  Do you have problems with loss of bowel control? N  Managing your Medications? Y  Comment husband manages  Managing your Finances? Y  Comment husband IT trainer? Y  Comment husband manages    Patient Care Team: Nche, Bonna Gains, NP as PCP - General (Internal Medicine) Romine, Edwena Felty, MD (Obstetrics and Gynecology) [place], Heather, [place] (Optometry) Hilarie Fredrickson, MD (Gastroenterology) York Spaniel, MD (Inactive) (Neurology)  Indicate any recent Medical Services you may have received from other than [facility] providers in the past year (date may be approximate).     Assessment:   This is a routine wellness examination for [place].  Hearing/Vision screen Hearing Screening  - Comments:: Denies hearing issues Vision Screening - Comments:: Regular eye exams, [facility]   Goals Addressed             This Visit's Progress    Patient Stated       10/27/2022, maintain current health       Depression Screen    10/27/2022    3:04 PM 08/29/2022    1:44 PM 08/29/2021    2:04 PM 05/07/2021    2:46 PM 11/06/2020    9:58 AM 11/06/2020    9:57 AM 08/25/2019    2:30 PM  PHQ 2/9 Scores  PHQ - 2 Score 0 0 0 0 0 0 0  PHQ- 9 Score  0  0 0      Fall Risk    10/27/2022    3:03 PM 08/29/2022    1:04 PM 08/29/2021    2:03 PM 05/07/2021    1:39 PM 05/02/2020    2:07 PM  Fall Risk   Falls in the past year? 0 0 0 0 0  Number falls in past yr: 0 0 0 0 0  Injury with Fall? 0 0 0 0 0  Risk for fall due to : Medication side effect No Fall Risks No Fall Risks No Fall Risks   Follow up Falls prevention discussed;Falls evaluation completed Falls evaluation completed Falls evaluation completed Falls evaluation completed     MEDICARE RISK AT HOME: Medicare Risk at Home Any stairs in or around the home?: No If so, are there any without handrails?: No Home free of loose throw rugs in walkways, pet beds, electrical cords, etc?: Yes Adequate lighting in your home to reduce risk of falls?: Yes Life alert?: No Use of a cane, walker or w/c?: No Grab bars in the bathroom?: Yes Shower chair or bench in shower?: Yes Elevated toilet seat or a handicapped toilet?: Yes  TIMED UP AND GO:  Was the test performed?  No    Cognitive Function:  6 CIT not administered. Patient has a diagnosis of dementia.  12/31/2021    2:00 PM 08/29/2021    2:18 PM 11/23/2018    2:32 PM 02/11/2018    1:28 PM 03/25/2017    9:33 AM  MMSE - Mini Mental State Exam  Not completed: Unable to complete Unable to complete     Orientation to time   2 2 5   Orientation to Place   3 3 5   Registration   3 3 3   Attention/ Calculation   2 4 5   Recall   1 2 0  Language- name 2 objects   2 2 2   Language-  repeat   1 0 1  Language- follow 3 step command   3 3 3   Language- read & follow direction   1 1 1   Write a sentence   1 1 1   Copy design   1 0 1  Copy design-comments   named 5 animals    Total score   20 21 27         Immunizations Immunization History  Administered Date(s) Administered   Fluad Quad(high Dose 65+) 11/03/2018, 11/06/2020   Influenza Split 11/04/2010, 12/17/2015, 11/11/2016   Influenza, High Dose Seasonal PF 12/16/2014, 11/08/2016   Influenza-Unspecified 11/17/2013, 12/04/2017   Moderna Covid-19 Vaccine Bivalent Booster 72yrs & up 11/04/2021   Moderna Sars-Covid-2 Vaccination 03/07/2019, 03/28/2019   PPD Test 06/25/2021   Pneumococcal Conjugate-13 12/13/2013   Pneumococcal Polysaccharide-23 11/12/2011   Tdap 08/24/2012   Zoster Recombinant(Shingrix) 11/21/2020    TDAP status: Up to date  Flu Vaccine status: Up to date  Pneumococcal vaccine status: Up to date  Covid-19 vaccine status: Completed vaccines  Qualifies for Shingles Vaccine? Yes   Zostavax completed No   Shingrix Completed?: needs second dose  Screening Tests Health Maintenance  Topic Date Due   INFLUENZA VACCINE  09/04/2022   COVID-19 Vaccine (4 - 2023-24 season) 10/05/2022   Zoster Vaccines- Shingrix (2 of 2) 11/29/2022 (Originally 01/16/2021)   Medicare Annual Wellness (AWV)  10/27/2023   Pneumonia Vaccine 26+ Years old  Completed   DEXA SCAN  Completed   Hepatitis C Screening  Completed   HPV VACCINES  Aged Out   DTaP/Tdap/Td  Discontinued   Colonoscopy  Discontinued    Health Maintenance  Health Maintenance Due  Topic Date Due   INFLUENZA VACCINE  09/04/2022   COVID-19 Vaccine (4 - 2023-24 season) 10/05/2022    Colorectal cancer screening: No longer required.   Mammogram status: No longer required due to age.  Bone Density status: Completed 04/26/2019.   Lung Cancer Screening: (Low Dose CT Chest recommended if Age 24-80 years, 20 pack-year currently smoking OR have quit  w/in 15years.) does not qualify.   Lung Cancer Screening Referral: no  Additional Screening:  Hepatitis C Screening: does qualify; Completed 01/01/2015  Vision Screening: Recommended annual ophthalmology exams for early detection of glaucoma and other disorders of the eye. Is the patient up to date with their annual eye exam?  Yes  Who is the provider or what is the name of the office in which the patient attends annual eye exams? [facility] If pt is not established with a provider, would they like to be referred to a provider to establish care? No .   Dental Screening: Recommended annual dental exams for proper oral hygiene  Diabetic Foot Exam: n/a  Community Resource Referral / Chronic Care Management: CRR required this visit?  No   CCM required this visit?  No     Plan:  I have personally reviewed and noted the following in the patient's chart:   Medical and social history Use of alcohol, tobacco or illicit drugs  Current medications and supplements including opioid prescriptions. Patient is not currently taking opioid prescriptions. Functional ability and status Nutritional status Physical activity Advanced directives List of other physicians Hospitalizations, surgeries, and ER visits in previous 12 months Vitals Screenings to include cognitive, depression, and falls Referrals and appointments  In addition, I have reviewed and discussed with patient certain preventive protocols, quality metrics, and best practice recommendations. A written personalized care plan for preventive services as well as general preventive health recommendations were provided to patient.     Barb Merino, LPN   8/84/[id number]   After Visit Summary: (MyChart) Due to this being a telephonic visit, the after visit summary with patients personalized plan was offered to patient via MyChart   Nurse Notes: none

## 2022-10-27 NOTE — Patient Instructions (Signed)
Ms. Alcide , Thank you for taking time to come for your Medicare Wellness Visit. I appreciate your ongoing commitment to your health goals. Please review the following plan we discussed and let me know if I can assist you in the future.   Referrals/Orders/Follow-Ups/Clinician Recommendations: none  This is a list of the screening recommended for you and due dates:  Health Maintenance  Topic Date Due   Flu Shot  09/04/2022   COVID-19 Vaccine (4 - 2023-24 season) 10/05/2022   Zoster (Shingles) Vaccine (2 of 2) 11/29/2022*   Medicare Annual Wellness Visit  10/27/2023   Pneumonia Vaccine  Completed   DEXA scan (bone density measurement)  Completed   Hepatitis C Screening  Completed   HPV Vaccine  Aged Out   DTaP/Tdap/Td vaccine  Discontinued   Colon Cancer Screening  Discontinued  *Topic was postponed. The date shown is not the original due date.    Advanced directives: (Copy Requested) Please bring a copy of your health care power of attorney and living will to the office to be added to your chart at your convenience.  Next Medicare Annual Wellness Visit scheduled for next year: Yes  Insert Preventive Care attachment Insert FALL PREVENTION attachment if needed

## 2022-10-28 ENCOUNTER — Telehealth: Payer: Self-pay | Admitting: *Deleted

## 2022-10-28 NOTE — Telephone Encounter (Signed)
Received fax from [facility] stating pt was given Comirnaty 2024-2025 in left arm IM and fluad Trivalent 2024-2025 in L arm IM. Info sheet sent to medical records for scanning.

## 2022-11-13 ENCOUNTER — Other Ambulatory Visit: Payer: Self-pay | Admitting: Nurse Practitioner

## 2022-11-13 DIAGNOSIS — I700 Atherosclerosis of aorta: Secondary | ICD-10-CM

## 2022-11-13 DIAGNOSIS — E781 Pure hyperglyceridemia: Secondary | ICD-10-CM

## 2022-11-13 DIAGNOSIS — E559 Vitamin D deficiency, unspecified: Secondary | ICD-10-CM

## 2022-11-13 DIAGNOSIS — I10 Essential (primary) hypertension: Secondary | ICD-10-CM

## 2022-11-13 DIAGNOSIS — F3342 Major depressive disorder, recurrent, in full remission: Secondary | ICD-10-CM

## 2022-11-18 ENCOUNTER — Telehealth: Payer: Self-pay

## 2022-11-18 NOTE — Patient Outreach (Signed)
  Care Coordination   Initial Visit Note   11/18/2022 Name: ETHELINE GEPPERT MRN: [id number] DOB: 03/01/1945  Torrence MAKYLAH BOSSARD is a 77 y.o. year old female who sees Nche, Bonna Gains, NP for primary care. I  spoke with spouse [name] by phone today.  What matters to the patients health and wellness today?  none    Goals Addressed             This Visit's Progress    COMPLETED: Care Coordination Activities- no follow up required       Care Coordination Interventions: Advised patient to Annual Wellness exam. Discussed [facility] services and support. Assessed SDOH. Advised to discuss with primary care physician if services needed in the future.         SDOH assessments and interventions completed:  Yes     Care Coordination Interventions:  Yes, provided   Follow up plan: No further intervention required.   Encounter Outcome:  Patient Visit Completed   Bary Leriche, RN, MSN [facility]  [facility], [facility] Management Community Coordinator Direct Dial: [phone]  Fax: [phone] Website: [email or web address]

## 2022-11-18 NOTE — Patient Instructions (Signed)
Visit Information  Thank you for taking time to visit with me today. Please don't hesitate to contact me if I can be of assistance to you.   Following are the goals we discussed today:   Goals Addressed             This Visit's Progress    COMPLETED: Care Coordination Activities-no follow up required       Care Coordination Interventions: Advised patient to Annual Wellness exam. Discussed [facility] services and support. Assessed SDOH. Advised to discuss with primary care physician if services needed in the future.          If you are experiencing a Mental Health or Behavioral Health Crisis or need someone to talk to, please call the Suicide and Crisis Lifeline: 988   Patient verbalizes understanding of instructions and care plan provided today and agrees to view in MyChart. Active MyChart status and patient understanding of how to access instructions and care plan via MyChart confirmed with patient.     The patient has been provided with contact information for the care management team and has been advised to call with any health related questions or concerns.   Bary Leriche, RN, MSN [facility], [facility] Management Community Coordinator Direct Dial: [phone]  Fax: [phone] Website: [email or web address]

## 2022-11-25 ENCOUNTER — Other Ambulatory Visit: Payer: Self-pay

## 2022-11-26 ENCOUNTER — Other Ambulatory Visit: Payer: Self-pay | Admitting: *Deleted

## 2022-11-26 DIAGNOSIS — G40909 Epilepsy, unspecified, not intractable, without status epilepticus: Secondary | ICD-10-CM

## 2022-11-26 NOTE — Telephone Encounter (Signed)
Per 12/26/21 note " We will continue levetiracetam 250mg  BID and memantine 10mg  BID. " Follow up scheduled on 12/23/22  Last filled on 11/13/22 #180 tablets (90 day supply)

## 2022-12-01 ENCOUNTER — Ambulatory Visit (INDEPENDENT_AMBULATORY_CARE_PROVIDER_SITE_OTHER): Payer: Medicare Other | Admitting: Nurse Practitioner

## 2022-12-01 VITALS — BP 99/53 | HR 60 | Temp 98.5°F | Resp 18 | Ht 60.0 in | Wt 94.8 lb

## 2022-12-01 DIAGNOSIS — R634 Abnormal weight loss: Secondary | ICD-10-CM | POA: Diagnosis not present

## 2022-12-01 DIAGNOSIS — I10 Essential (primary) hypertension: Secondary | ICD-10-CM

## 2022-12-01 MED ORDER — LOSARTAN POTASSIUM 25 MG PO TABS: 25.0000 mg | ORAL_TABLET | Freq: Every day | ORAL | 1 refills | Status: DC

## 2022-12-01 NOTE — Progress Notes (Signed)
Established Patient Visit  Patient: April Chung   DOB: [birth year]   77 y.o. Female  MRN: [id number] Visit Date: 12/01/2022  Subjective:    Chief Complaint  Patient presents with   office visit     PT is here for 3 month follow up weight loss manangment    HPI Weight loss, unintentional Note from 08/2022 OV: "11lbs weight loss noted today. Husband reports she eats aday with 1snack, no change in appetite, she denies any ABDOMEN pain or constipation or diarrhea or dysphagia or dysuria."  1lbs weight loss noted today. Husband reports Mrs. Gildner refuses to consume red meat or chicken often. She will eat shrimp occasionally. She primarily eats vegetables, cereal w/milk , banana sandwich, 1bottle of ensure. Mrs. Gemberling and her husband are not interested I any additional testing/diagnostic to determine any secondary cause of weight loss. Wt Readings from Last 3 Encounters:  12/01/22 94 lb 12.8 oz (43 kg)  08/29/22 95 lb 9.6 oz (43.4 kg)  12/31/21 106 lb (48.1 kg)    Normal CBC, THYROID and CMP. Elevated glucose due to non fasting state. Advised to incorporate nuts, greek yogurt/cottage cheese, peanut butter etc. Provided printed information on high protein diet. Advised to add 2bottles of high protein ensure daily. F/up in 3months  Hypertension Persistent hypotension noted with amlodipine 2.5mg  and losartan 50mg . Husband noted increased daytime fatigue. She denies any dizziness. BP Readings from Last 3 Encounters:  12/01/22 (!) 99/53  08/29/22 112/80  12/31/21 (!) 147/74    Hold amlodipine and decrease losartan to 25mg  daily Advised to monitor BP daily. Hold losartan in BP<100/60. Call office if BP >130/80 for more than 2days. F/up in 3months    Reviewed medical, surgical, and social history today  Medications: Outpatient Medications Prior to Visit  Medication Sig   aspirin 81 MG tablet Take 81 mg by mouth daily.   brimonidine (ALPHAGAN) 0.15 % ophthalmic  solution 1 drop 2 (two) times daily.   citalopram (CELEXA) 20 MG tablet TAKE 1 TABLET BY MOUTH EVERY DAY   levETIRAcetam (KEPPRA) 250 MG tablet Take 1 tablet (250 mg total) by mouth 2 (two) times daily.   memantine (NAMENDA) 10 MG tablet Take 1 tablet (10 mg total) by mouth 2 (two) times daily.   Multiple Vitamins-Calcium (ONE-A-DAY WOMENS PO) Take by mouth daily.   pravastatin (PRAVACHOL) 20 MG tablet TAKE 1 TABLET BY MOUTH AT BEDTIME   XELPROS 0.005 % EMUL Apply 1 drop to eye daily.   [DISCONTINUED] amLODipine (NORVASC) 2.5 MG tablet TAKE 1 TABLET BY MOUTH EVERY DAY   [DISCONTINUED] fenofibrate 54 MG tablet TAKE 1 TABLET BY MOUTH EVERY DAY   [DISCONTINUED] losartan (COZAAR) 50 MG tablet TAKE 1 TABLET BY MOUTH EVERY DAY   No facility-administered medications prior to visit.   Reviewed past medical and social history.   ROS per HPI above      Objective:  BP (!) 99/53 (BP Location: Right Arm, Patient Position: Sitting, Cuff Size: Small) Comment: recheck BP reading  Pulse 60   Temp 98.5 F (36.9 C) (Temporal)   Resp 18   Ht 5' (1.524 m)   Wt 94 lb 12.8 oz (43 kg)   LMP 08/03/1993 (Approximate)   SpO2 95%   BMI 18.51 kg/m      Physical Exam Vitals and nursing note reviewed. Exam conducted with a chaperone present.  Constitutional:      General:  She is not in acute distress.    Appearance: She is underweight.  Cardiovascular:     Rate and Rhythm: Normal rate and regular rhythm.     Pulses: Normal pulses.     Heart sounds: Normal heart sounds.  Pulmonary:     Effort: Pulmonary effort is normal.     Breath sounds: Normal breath sounds.  Chest:  Breasts:    Breasts are symmetrical.     Right: Normal.     Left: Normal.  Abdominal:     General: Bowel sounds are normal.     Palpations: Abdomen is soft.  Musculoskeletal:     Right lower leg: No edema.     Left lower leg: No edema.  Lymphadenopathy:     Upper Body:     Right upper body: No supraclavicular, axillary or  pectoral adenopathy.     Left upper body: No supraclavicular, axillary or pectoral adenopathy.  Skin:    General: Skin is warm and dry.     Findings: No rash.  Neurological:     Mental Status: She is alert and oriented to person, place, and time.     No results found for any visits on 12/01/22.    Assessment & Plan:    Problem List Items Addressed This Visit     Hypertension - Primary    Persistent hypotension noted with amlodipine 2.5mg  and losartan 50mg . Husband noted increased daytime fatigue. She denies any dizziness. BP Readings from Last 3 Encounters:  12/01/22 (!) 99/53  08/29/22 112/80  12/31/21 (!) 147/74    Hold amlodipine and decrease losartan to 25mg  daily Advised to monitor BP daily. Hold losartan in BP<100/60. Call office if BP >130/80 for more than 2days. F/up in 3months      Relevant Medications   losartan (COZAAR) 25 MG tablet   Weight loss, unintentional    Note from 08/2022 OV: "11lbs weight loss noted today. Husband reports she eats aday with 1snack, no change in appetite, she denies any ABDOMEN pain or constipation or diarrhea or dysphagia or dysuria."  1lbs weight loss noted today. Husband reports Mrs. Zaccardi refuses to consume red meat or chicken often. She will eat shrimp occasionally. She primarily eats vegetables, cereal w/milk , banana sandwich, 1bottle of ensure. Mrs. Carp and her husband are not interested I any additional testing/diagnostic to determine any secondary cause of weight loss. Wt Readings from Last 3 Encounters:  12/01/22 94 lb 12.8 oz (43 kg)  08/29/22 95 lb 9.6 oz (43.4 kg)  12/31/21 106 lb (48.1 kg)    Normal CBC, THYROID and CMP. Elevated glucose due to non fasting state. Advised to incorporate nuts, greek yogurt/cottage cheese, peanut butter etc. Provided printed information on high protein diet. Advised to add 2bottles of high protein ensure daily. F/up in 3months      Return in about 3 months (around 03/03/2023)  for HTN.     Alysia Penna, NP

## 2022-12-01 NOTE — Patient Instructions (Addendum)
Stop amlodipine and fenofibrate Take 1/2tab of losartan daily Hold losartan dose if BP <100/60 Monitor BP every other day in AM Call office if BP >130/80 for more than 2days. Send BP reading via mychart once a week  Increase daily protein intake Drink 2bottles of ensure daily  Protein Content in Foods Protein is a necessary nutrient in any diet. It helps build and repair muscles, bones, and skin. Depending on your overall health, you may need more or less protein in your diet. You are encouraged to eat a variety of protein foods to ensure that you get all the essential nutrients that are found in different protein foods. Talk with your health care provider or dietitian about how much protein you need each day and which sources of protein are best for you. Protein is especially important for: Repairing and making cells and tissues. Fighting infection. Providing energy. Growth and development. See the following list for the protein content of some common foods. What are tips for getting more protein in your diet? Try to replace processed carbohydrates with high-quality protein. Snack on nuts and seeds instead of chips. Replace baked desserts with [place] yogurt. Eat protein foods from both plant and animal sources. Replace red meat with seafood. Add beans and peas to salads, soups, and side dishes. Include a protein food with each meal and snack. Reading food labels You can find the amount of protein in a food item by looking at the nutrition facts label. Use the total grams listed to help you reach your daily goal. What foods are high in protein?  High-protein foods contain 4 grams (g) or more of protein per serving. They include: Grains Quinoa (cooked) -- 1 cup (185 g) has 8 g of protein. Whole wheat pasta (cooked) -- 1 cup (140 g) has 6 g of protein. Meat Beef, ground sirloin (cooked) -- 3 oz (85 g) has 24 g of protein. Chicken breast, boneless and skinless (cooked) -- 3 oz (85 g)  has 25 g of protein. Egg -- 1 egg has 6 g of protein. Fish, filet (cooked) -- 1 oz (28 g) has 6-7 g of protein. Lamb (cooked) -- 3 oz (85 g) has 24 g of protein. Pork tenderloin (cooked) -- 3 oz (85 g) has 23 g of protein. Tuna (canned in water) -- 3 oz (85 g) has 20 g of protein. Dairy Cottage cheese --  cup (114 g) has 13.4 g of protein. Milk -- 1 cup (237 mL) has 8 g of protein. Cheese (hard) -- 1 oz (28 g) has 7 g of protein. Yogurt, regular -- 6 oz (170 g) has 8 g of protein. Greek yogurt -- 6 oz (200 g) has 18 g protein. Plant protein Garbanzo beans (canned or cooked) --  cup (130 g) has 6-7 g of protein. Kidney beans (canned or cooked) --  cup (130 g) has 6-7 g of protein. Nuts (peanuts, pistachios, almonds) -- 1 oz (28 g) has 6 g of protein. Peanut butter -- 1 oz (32 g) has 7-8 g of protein. Pumpkin seeds -- 1 oz (28 g) has 8.5 g of protein. Soybeans (roasted) -- 1 oz (28 g) has 8 g of protein. Soybeans (cooked) --  cup (90 g) has 11 g of protein. Soy milk -- 1 cup (250 mL) has 5-10 g of protein. Soy or vegetable patty -- 1 patty has 11 g of protein. Sunflower seeds -- 1 oz (28 g) has 5.5 g of protein. Buckwheat -- 1 oz (33  g) has 4.3 g of protein. Tofu (firm) --  cup (124 g) has 20 g of protein. Tempeh --  cup (83 g) has 16 g of protein. The items listed above may not be a complete list of foods high in protein. Actual amounts of protein may differ depending on processing. Contact a dietitian for more information. What foods are low in protein?  Low-protein foods contain 3 grams (g) or less of protein per serving. They include: Fruits Fruit or vegetable juice --  cup (125 mL) has 1 g of protein. Vegetables Beets (raw or cooked) --  cup (68 g) has 1.5 g of protein. Broccoli (raw or cooked) --  cup (44 g) has 2 g of protein. Collard greens (raw or cooked) --  cup (42 g) has 2 g of protein. Green beans (raw or cooked) --  cup (83 g) has 1 g of protein. Green peas  (canned) --  cup (80 g) has 3.5 g of protein. Potato (baked with skin) -- 1 medium potato (173 g) has 3 g of protein. Spinach (cooked) --  cup (90 g) has 3 g of protein. Squash (cooked) --  cup (90 g) has 1.5 g of protein. Avocado -- 1 cup (146 g) has 2.7 g of protein. Grains Bran cereal --  cup (30 g) has 2-3 g of protein. Bread -- 1 slice has 2.5 g of protein. Corn (fresh or cooked) --  cup (77 g) has 2 g of protein. Flour tortilla -- One 6-inch (15 cm) tortilla has 2.5 g of protein. Muffins -- 1 small muffin (2 oz or 57 g) has 3 g of protein. Oatmeal (cooked) --  cup (40 g) has 3 g of protein. Rice (cooked) --  cup (79 g) has 2.5-3.5 g of protein. Dairy Cream cheese -- 1 oz (29 g) has 2 g of protein. Creamer (half-and-half) -- 1 oz (29 mL) has 1 g of protein. Frozen yogurt --  cup (72 g) has 3 g of protein. Sour cream --  cup (75 g) has 2.5 g of protein. The items listed above may not be a complete list of foods low in protein. Actual amounts of protein may differ depending on processing. Contact a dietitian for more information. Summary Protein is a nutrient that your body needs for growth and development, repairing and making cells and tissues, fighting infection, and providing energy. Protein is in both plant and animal foods. Some of these foods have more protein than others. Depending on your overall health, you may need more or less protein in your diet. Talk to your health care provider about how much protein you need. This information is not intended to replace advice given to you by your health care provider. Make sure you discuss any questions you have with your health care provider. Document Revised: 12/26/2019 Document Reviewed: 12/26/2019 Elsevier Patient Education  2024 [employer].

## 2022-12-01 NOTE — Assessment & Plan Note (Addendum)
Note from 08/2022 OV: "11lbs weight loss noted today. Husband reports she eats aday with 1snack, no change in appetite, she denies any ABDOMEN pain or constipation or diarrhea or dysphagia or dysuria."  1lbs weight loss noted today. Husband reports April Chung refuses to consume red meat or chicken often. She will eat shrimp occasionally. She primarily eats vegetables, cereal w/milk , banana sandwich, 1bottle of ensure. [name] and her husband are not interested I any additional testing/diagnostic to determine any secondary cause of weight loss. Wt Readings from Last 3 Encounters:  12/01/22 94 lb 12.8 oz (43 kg)  08/29/22 95 lb 9.6 oz (43.4 kg)  12/31/21 106 lb (48.1 kg)    Normal CBC, THYROID and CMP. Elevated glucose due to non fasting state. Advised to incorporate nuts, greek yogurt/cottage cheese, peanut butter etc. Provided printed information on high protein diet. Advised to add 2bottles of high protein ensure daily. F/up in 3months

## 2022-12-01 NOTE — Assessment & Plan Note (Signed)
Persistent hypotension noted with amlodipine 2.5mg  and losartan 50mg . Husband noted increased daytime fatigue. She denies any dizziness. BP Readings from Last 3 Encounters:  12/01/22 (!) 99/53  08/29/22 112/80  12/31/21 (!) 147/74    Hold amlodipine and decrease losartan to 25mg  daily Advised to monitor BP daily. Hold losartan in BP<100/60. Call office if BP >130/80 for more than 2days. F/up in 3months

## 2022-12-05 ENCOUNTER — Encounter: Payer: Self-pay | Admitting: Nurse Practitioner

## 2022-12-12 ENCOUNTER — Encounter: Payer: Self-pay | Admitting: Nurse Practitioner

## 2022-12-15 ENCOUNTER — Other Ambulatory Visit: Payer: Self-pay | Admitting: Nurse Practitioner

## 2022-12-15 DIAGNOSIS — I10 Essential (primary) hypertension: Secondary | ICD-10-CM

## 2022-12-22 NOTE — Patient Instructions (Signed)
Below is our plan:  We will continue donepezil 10mg and memantine 10mg daily  Please make sure you are staying well hydrated. I recommend 50-60 ounces daily. Well balanced diet and regular exercise encouraged. Consistent sleep schedule with 6-8 hours recommended.   Please continue follow up with care team as directed.   Follow up with me in 1 year   You may receive a survey regarding today's visit. I encourage you to leave honest feed back as I do use this information to improve patient care. Thank you for seeing me today!   Management of Memory Problems   There are some general things you can do to help manage your memory problems.  Your memory may not in fact recover, but by using techniques and strategies you will be able to manage your memory difficulties better.   1)  Establish a routine. Try to establish and then stick to a regular routine.  By doing this, you will get used to what to expect and you will reduce the need to rely on your memory.  Also, try to do things at the same time of day, such as taking your medication or checking your calendar first thing in the morning. Think about think that you can do as a part of a regular routine and make a list.  Then enter them into a daily planner to remind you.  This will help you establish a routine.   2)  Organize your environment. Organize your environment so that it is uncluttered.  Decrease visual stimulation.  Place everyday items such as keys or cell phone in the same place every day (ie.  Basket next to front door) Use post it notes with a brief message to yourself (ie. Turn off light, lock the door) Use labels to indicate where things go (ie. Which cupboards are for food, dishes, etc.) Keep a notepad and pen by the telephone to take messages   3)  Memory Aids A diary or journal/notebook/daily planner Making a list (shopping list, chore list, to do list that needs to be done) Using an alarm as a reminder (kitchen timer or cell  phone alarm) Using cell phone to store information (Notes, Calendar, Reminders) Calendar/White board placed in a prominent position Post-it notes   In order for memory aids to be useful, you need to have good habits.  It's no good remembering to make a note in your journal if you don't remember to look in it.  Try setting aside a certain time of day to look in journal.   4)  Improving mood and managing fatigue. There may be other factors that contribute to memory difficulties.  Factors, such as anxiety, depression and tiredness can affect memory. Regular gentle exercise can help improve your mood and give you more energy. Exercise: there are short videos created by the National Institute on Health specially for older adults: https://bit.ly/2I30q97.  Mediterranean diet: which emphasizes fruits, vegetables, whole grains, legumes, fish, and other seafood; unsaturated fats such as olive oils; and low amounts of red meat, eggs, and sweets. A variation of this, called MIND (Mediterranean-DASH Intervention for Neurodegenerative Delay) incorporates the DASH (Dietary Approaches to Stop Hypertension) diet, which has been shown to lower high blood pressure, a risk factor for Alzheimer's disease. More information at: https://www.nia.nih.gov/health/what-do-we-know-about-diet-and-prevention-alzheimers-disease.  Aerobic exercise that improve heart health is also good for the mind.  National Institute on Aging have short videos for exercises that you can do at home: www.nia.nih.gov/Go4Life Simple relaxation techniques may help relieve   symptoms of anxiety Try to get back to completing activities or hobbies you enjoyed doing in the past. Learn to pace yourself through activities to decrease fatigue. Find out about some local support groups where you can share experiences with others. Try and achieve 7-8 hours of sleep at night.   Resources for Family/Caregiver  Online caregiver support groups can be found at  alz.org or call Alzheimer's Association's 24/7 hotline: 800.272.3900. [facility] offers in-person, virtual support groups and individual counseling for both care partners and persons with memory loss. Call for more information at [phone].   Advanced care plan: there are two types of Power of Attorney: healthcare and durable. Healthcare POA is a designated person to make healthcare decisions on your behalf if you were too sick to make them yourself. This person can be selected and documented by your physician. Durable POA has to be set up with a lawyer who takes charge of your finances and estate if you were too sick or cognitively impaired to manage your finances accurately. You can find a local Elder Law lawyer here: https://www.naela.org/.  Check out www.planyourlifespan.org, which will help you plan before a crisis and decide who will take care of life considerations in a circumstance where you may not be able to speak for yourself.   Helpful books (available on Amazon or your local bookstore):  By Dr. Ed Shaw: Keeping Love Alive as Memories Fade: The 5 Love Languages and the Alzheimer's Journey Nov 04, 2014 The Dementia Care Partner's Workbook: A Guide for Understanding, Education, and Hope Paperback - July 04, 2017.  Both available for less than $15.   "Coping with behavior change in dementia: a family caregiver's guide" by Beth Spencer & Laurie White "A Caregiver's Guide to Dementia: Using Activities and Other Strategies to Prevent, Reduce and Manage Behavioral Symptoms" by Laura N. Gitlin and Catherine Piersol.  "Creating Moments of Joy for the Person with Alzheimer's or Dementia" 4th edition by Jolene Brackrey  Caregiver videos on common behaviors related to dementia: https://www.[facility].org/dementia/caregiver-education-videos  [facility] Caregiver Portal: free to sign up, links to local resources: [email or web address]/login  

## 2022-12-22 NOTE — Progress Notes (Unsigned)
No chief complaint on file.   HISTORY OF PRESENT ILLNESS:  12/22/22 ALL: April Chung returns for follow up for seizures and memory loss. She was seen last 12/2021. We continued levetiracetam 250mg  and memantine 10mg  BID.   11:28/2023 ALL: April Chung returns for follow up for seizures and memory loss. She continues levetiracetam 250mg  BID and memantine 10mg  BID. She is doing well. No significant changes since last year. She and her husband now live in [facility] independent living. She is a little less active outside but continues to stay active around her home. She is able to perform ADLs. She does not drive. Memory is stable. No seizures.   12/25/2020 ALL: April Chung returns for follow up for seizures and memory loss. She continues levetiracetam 250mg  BID and memantine 10mg  BID. She continues to do well. She is tolerating meds. Memory is stable. No significant changes. She denies falls. Appetite is good. She has lost about 3 pounds over the year but contributes this to eating healthier foods. She continues to perform ADLs independently. She is able to manage her own medications. She walks her dogs every day and tries to walk around in the yard when she can. She enjoys crossword puzzles. She drinks about 1/2 glass of merlot every night.   12/21/2019 ALL:  April Chung is a 77 y.o. female here today for follow up for seizures and memory loss. She continues levetiracetam 250mg  BID and Namenda 10mg  BID. She presents with her husband who aids in history. She feels she is doing well and husband agrees. She continues to be fairly independent. Mr Tao helps with meal preparation and medications. She is able to perform ADL's. She is less active than she used to be but continues to walk daily. No seizure activity.    HISTORY (copied from previous note)  Amit Judie Petit Stdenis is a 77 y.o. female here today for follow up of seizure ans memory loss. She continues Keppra 250mg  twice daily. She is also taking Namenda 10mg  twice  daily. She was unable to tolerate Aricept due to low heart rate.  Overall, she feels that she is doing very well.  Her husband is with her today and agrees.  They feel that memory is fairly stable.  She is able to perform ADLs independently.  She does not drive.  She is not as active as she used to be.  Her husband mentions that walking has become much slower and pace.  He states that she seems stable in balance but moves her feet much slower than she used to.  He denies shuffling or stooped posture.  He has considered having her to use a walking stick but is concerned that she will not use it.  There have been no falls.  No strokelike symptoms.  No seizure activity.   She and her husband deny concerns of elevated blood pressures at home.  They admit that they do not routinely check.  Blood pressure was normal in the office with Megan in January/2020.  Initial blood pressure reading in office today was 182/100.  At completion of visit we recheck blood pressure and it is 151/77.  Her husband feels that normal blood pressure readings are 130s over 70s to 80s.  She is followed by primary care for hypertension.  She is currently taking Cozaar 50 mg daily as well as Norvasc 2.5 mg daily.  She denies headaches, dizziness, chest pain or trouble breathing.   HISTORY: (copied from [facility] note on 02/11/2018)  April Chung is a 77 year old right handed female who presents today for follow up regarding seizures and memory loss. She is taking Namenda 10mg  twice daily. She had to stop Aricept due to decreased heart rate. Mr Dvorkin states that he has not been able to tell much of a difference since stopping Aricept with the exception of slower movements. She is tolerating Namenda well. MMSE today is 21/30. In 03/2017 she scored 27/30. She continues to perform all ADL's independently. Mr Harralson works during the day and she is doing well staying home alone. He does assist with dosing medications. She has become reclusive  and typically does not like to go out much per her husband.   She is taking Keppra 250mg  twice daily. She denies seizure like activity. She is tolerating Keppra well without excessive drowsiness.    HISTORY: (copied from Dr Anne Hahn' note from 02/10/2017)   April Chung is a 77 year old right-handed white female with a history of a mild memory disturbance and history of seizures.  The patient is on Aricept taking 10 mg daily, she is on Namenda taking 10 mg twice daily.  She tolerates these medications well.  She is on low-dose Keppra, she tolerates this drug also without drowsiness.  She has not had any recurring seizures since last seen, she does operate a motor vehicle without difficulty.  The patient has not noted any progression of balance issues, she denies any progression of memory.  She returns to this office for an evaluation    REVIEW OF SYSTEMS: Out of a complete 14 system review of symptoms, the patient complains only of the following symptoms, memory loss and all other reviewed systems are negative.   ALLERGIES: Allergies  Allergen Reactions   Aricept [Donepezil Hcl] Other (See Comments)    Low heart rate, do not take per cardiologist   Plaquenil [Hydroxychloroquine Sulfate]    Hydroxychloroquine Rash     HOME MEDICATIONS: Outpatient Medications Prior to Visit  Medication Sig Dispense Refill   aspirin 81 MG tablet Take 81 mg by mouth daily.     brimonidine (ALPHAGAN) 0.15 % ophthalmic solution 1 drop 2 (two) times daily.     citalopram (CELEXA) 20 MG tablet TAKE 1 TABLET BY MOUTH EVERY DAY 90 tablet 3   levETIRAcetam (KEPPRA) 250 MG tablet Take 1 tablet (250 mg total) by mouth 2 (two) times daily. 180 tablet 3   losartan (COZAAR) 25 MG tablet Take 1 tablet (25 mg total) by mouth daily. 90 tablet 1   memantine (NAMENDA) 10 MG tablet Take 1 tablet (10 mg total) by mouth 2 (two) times daily. 180 tablet 3   Multiple Vitamins-Calcium (ONE-A-DAY WOMENS PO) Take by mouth daily.      pravastatin (PRAVACHOL) 20 MG tablet TAKE 1 TABLET BY MOUTH AT BEDTIME 90 tablet 3   XELPROS 0.005 % EMUL Apply 1 drop to eye daily.     No facility-administered medications prior to visit.     PAST MEDICAL HISTORY: Past Medical History:  Diagnosis Date   Abnormal EEG 01/14/12   started on Keppra   Dermatomyositis (HCC)    remission on pred until 2008, rheum at [facility]   GERD (gastroesophageal reflux disease)    agravated with Fosamax   Glaucoma    Hypertension 2013   Memory loss    PMB (postmenopausal bleeding) 08/2003   Seizures (HCC)    Status post dilation of esophageal narrowing    Urinary incontinence, mixed 07/01/11   Fitted for  2.75 " Ring Pessary with support   Vitamin D deficiency disease      PAST SURGICAL HISTORY: Past Surgical History:  Procedure Laterality Date   COLONOSCOPY  09/25/04   Dr. Loreta Ave   COLONOSCOPY  02/2009   ENDOMETRIAL BIOPSY  08/24/03   weak proliferative endo, SH[facility] neg.   ESOPHAGOGASTRODUODENOSCOPY ENDOSCOPY  04/29/04   anemia without cause   NO PAST SURGERIES       FAMILY HISTORY: Family History  Problem Relation Age of Onset   Dementia Father        died age 61, otherwise healthy   Hypertension Mother    Heart disease Maternal Grandfather    Heart disease Maternal Grandmother    Seizures Neg Hx      SOCIAL HISTORY: Social History   Socioeconomic History   Marital status: Married    Spouse name: [name]   Number of children: 1   Years of education: hs   Highest education level: Associate degree: occupational, Scientist, product/process development, or vocational program  Occupational History   Occupation: housewife  Tobacco Use   Smoking status: Former    Current packs/day: 0.00    Average packs/day: 1 pack/day for 15.0 years (15.0 ttl pk-yrs)    Types: Cigarettes    Start date: 02/03/1961    Quit date: 02/04/1976    Years since quitting: 46.9   Smokeless tobacco: Never  Vaping Use   Vaping status: Never Used  Substance and Sexual Activity    Alcohol use: Not Currently    Alcohol/week: 7.0 standard drinks of alcohol    Types: 7 Glasses of wine per week    Comment: not actively   Drug use: No   Sexual activity: Never    Partners: Male    Birth control/protection: Post-menopausal  Other Topics Concern   Not on file  Social History Narrative   Married, lives with spouse [name]).   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   Patient has one child.   Patient is right-handed.   Patient drinks 2-3 cups of coffee daily and maybe half cup at night.               Social Determinants of Health   Financial Resource Strain: Low Risk  (12/01/2022)   Overall Financial Resource Strain (CARDIA)    Difficulty of Paying Living Expenses: Not hard at all  Food Insecurity: No Food Insecurity (12/01/2022)   Hunger Vital Sign    Worried About Running Out of Food in the Last Year: Never true    Ran Out of Food in the Last Year: Never true  Transportation Needs: No Transportation Needs (12/01/2022)   PRAPARE - Administrator, Civil Service (Medical): No    Lack of Transportation (Non-Medical): No  Physical Activity: Inactive (12/01/2022)   Exercise Vital Sign    Days of Exercise per Week: 0 days    Minutes of Exercise per Session: 0 min  Stress: No Stress Concern Present (12/01/2022)   [employer] of Occupational Health - Occupational Stress Questionnaire    Feeling of Stress : Not at all  Social Connections: Moderately Integrated (12/01/2022)   Social Connection and Isolation Panel [NHANES]    Frequency of Communication with Friends and Family: Three times a week    Frequency of Social Gatherings with Friends and Family: Three times a week    Attends Religious Services: 1 to 4 times per year    Active  Member of Clubs or Organizations: No    Attends Banker Meetings: Never    Marital Status: Married  Recent Concern: Social Connections -  Moderately Isolated (10/27/2022)   Social Connection and Isolation Panel [NHANES]    Frequency of Communication with Friends and Family: Three times a week    Frequency of Social Gatherings with Friends and Family: Once a week    Attends Religious Services: Never    Database administrator or Organizations: No    Attends Banker Meetings: Never    Marital Status: Married  Catering manager Violence: Not At Risk (10/27/2022)   Humiliation, Afraid, Rape, and Kick questionnaire    Fear of Current or Ex-Partner: No    Emotionally Abused: No    Physically Abused: No    Sexually Abused: No      PHYSICAL EXAM  There were no vitals filed for this visit.    There is no height or weight on file to calculate BMI.   Generalized: Well developed, in no acute distress  Cardiology: normal rate and rhythm, no murmur auscultated  Respiratory: clear to auscultation bilaterally    Neurological examination  Mentation: Alert , not oriented to time, oriented to city and state, oriented to most history taking. Follows all commands speech and language fluent Cranial nerve II-XII: Pupils were equal round reactive to light. Extraocular movements were full, visual field were full on confrontational test. Facial sensation and strength were normal. Head turning and shoulder shrug  were normal and symmetric. Motor: The motor testing reveals 5 over 5 strength of all 4 extremities. Good symmetric motor tone is noted throughout.  Sensory: Sensory testing is intact to soft touch on all 4 extremities. No evidence of extinction is noted.  Coordination: Cerebellar testing reveals good finger-nose-finger and heel-to-shin bilaterally.  Gait and station: Gait is short and wide with external rotation noted of bilateral feet, reduced arm swing, tandem not attempted. Gait is stable without assistive device but she does hold on to Mr Smiths arm for stability.  Reflexes: Deep tendon reflexes are symmetric and  normal bilaterally.     DIAGNOSTIC DATA (LABS, IMAGING, TESTING) - I reviewed patient records, labs, notes, testing and imaging myself where available.  Lab Results  Component Value Date   WBC 7.3 08/29/2022   HGB 12.8 08/29/2022   HCT 38.2 08/29/2022   MCV 95.1 08/29/2022   PLT 299.0 08/29/2022      Component Value Date/Time   NA 142 08/29/2022 1343   NA 140 09/08/2017 1437   K 4.2 08/29/2022 1343   CL 106 08/29/2022 1343   CO2 29 08/29/2022 1343   GLUCOSE 138 (H) 08/29/2022 1343   BUN 24 (H) 08/29/2022 1343   BUN 19 09/08/2017 1437   CREATININE 0.83 08/29/2022 1343   CALCIUM 9.6 08/29/2022 1343   PROT 7.0 08/29/2022 1343   PROT 7.5 02/07/2016 1256   ALBUMIN 4.2 08/29/2022 1343   ALBUMIN 4.5 02/07/2016 1256   AST 18 08/29/2022 1343   ALT 14 08/29/2022 1343   ALKPHOS 45 08/29/2022 1343   BILITOT 0.5 08/29/2022 1343   BILITOT 0.5 02/07/2016 1256   GFRNONAA 91 09/08/2017 1437   GFRAA 104 09/08/2017 1437   Lab Results  Component Value Date   CHOL 140 08/29/2022   HDL 52.00 08/29/2022   LDLCALC 73 08/29/2022   LDLDIRECT 129.0 02/09/2017   TRIG 76.0 08/29/2022   CHOLHDL 3 08/29/2022   No results found for: "HGBA1C" Lab Results  Component Value Date   VITAMINB12 983 02/07/2016   Lab Results  Component Value Date   TSH 1.18 08/29/2022      12/31/2021    2:00 PM 08/29/2021    2:18 PM 11/23/2018    2:32 PM  MMSE - Mini Mental State Exam  Not completed: Unable to complete Unable to complete   Orientation to time   2  Orientation to Place   3  Registration   3  Attention/ Calculation   2  Recall   1  Language- name 2 objects   2  Language- repeat   1  Language- follow 3 step command   3  Language- read & follow direction   1  Write a sentence   1  Copy design   1  Copy design-comments   named 5 animals  Total score   20     ASSESSMENT AND PLAN  77 y.o. year old female  has a past medical history of Abnormal EEG (01/14/12), Dermatomyositis (HCC),  GERD (gastroesophageal reflux disease), Glaucoma, Hypertension (2013), Memory loss, PMB (postmenopausal bleeding) (08/2003), Seizures (HCC), Status post dilation of esophageal narrowing, Urinary incontinence, mixed (07/01/11), and Vitamin D deficiency disease. here with   Seizure disorder (HCC)  Moderate dementia with anxiety, unspecified dementia type (HCC)  April Chung is doing well. We will continue levetiracetam 250mg  BID and memantine 10mg  BID. MMSE deferred as it will not change treatment plan and makes her anxious. Healthy lifestyle habits encouraged. Memory compensation strategies reviewed. She will continue close follow up with PCP. Return to see neurology in 1 year.    Shawnie Dapper, MSN, FNP-C 12/22/2022, 12:49 PM  [facility] [address], [address] [place], [place] [address] [phone]

## 2022-12-23 ENCOUNTER — Encounter: Payer: Self-pay | Admitting: Family Medicine

## 2022-12-23 ENCOUNTER — Ambulatory Visit: Payer: Medicare Other | Admitting: Family Medicine

## 2022-12-23 VITALS — BP 131/82 | HR 90 | Ht 59.0 in | Wt 95.0 lb

## 2022-12-23 DIAGNOSIS — F03B4 Unspecified dementia, moderate, with anxiety: Secondary | ICD-10-CM | POA: Diagnosis not present

## 2022-12-23 DIAGNOSIS — G40909 Epilepsy, unspecified, not intractable, without status epilepticus: Secondary | ICD-10-CM | POA: Diagnosis not present

## 2022-12-23 MED ORDER — MEMANTINE HCL 10 MG PO TABS: 10.0000 mg | ORAL_TABLET | Freq: Two times a day (BID) | ORAL | 3 refills | Status: DC

## 2023-01-06 ENCOUNTER — Other Ambulatory Visit: Payer: Self-pay

## 2023-01-14 DIAGNOSIS — H401132 Primary open-angle glaucoma, bilateral, moderate stage: Secondary | ICD-10-CM | POA: Diagnosis not present

## 2023-01-21 DIAGNOSIS — K080 Exfoliation of teeth due to systemic causes: Secondary | ICD-10-CM | POA: Diagnosis not present

## 2023-01-23 ENCOUNTER — Other Ambulatory Visit: Payer: Self-pay | Admitting: Nurse Practitioner

## 2023-01-23 DIAGNOSIS — I10 Essential (primary) hypertension: Secondary | ICD-10-CM

## 2023-01-26 ENCOUNTER — Telehealth: Payer: Self-pay | Admitting: Family Medicine

## 2023-01-26 NOTE — Telephone Encounter (Signed)
Please address tremor with primary Neurologist when back after the holiday. That is not an emergency.

## 2023-01-26 NOTE — Telephone Encounter (Signed)
Pt's husband called stating that recently the pt has developed sever trembling in her L arm and hand. Husband would like to speak to the RN to discuss if this has to do with her Dementia or Seizers. Please advise.

## 2023-01-26 NOTE — Telephone Encounter (Signed)
Call to patients husband who reports wife has increased tremor in her left hand and arm. He is not sure if this is related to seizures which it is unknown if she ever had one. Was started on Keppra years ago for abnormal EEG. She is currently tapering off and it is 125 mg Keppra twice daily. She is also taking namenda 10 mg twice daily. These tremors have been happening for the last several months even before tapering the Keppra. Husband is concerned if it could be due to decline overal or seizures.  He states it only happens about  2 times a week and patient lays down and then wakes up and has no more tremors until next time it happens. He denies any convulsing or other seizures activity/symptoms. Advised I would send to provider for advice and recommendations.

## 2023-01-27 NOTE — Telephone Encounter (Signed)
Discussed patient with Dr. Pearlean Brownie and he advised to appointment with neurologist to evaluate tremors.  Patient has seen NP's and will ask phone room to schedule with first available MD.   Call to husband and he is an agreement to schedule. He does not think they are seizure related but does want the tremor evaluated. Patient is currently weaning of Keppra but husband does feel they are related.  husband aware to call office if symptoms change once weaning from Keppra is complete.

## 2023-02-02 ENCOUNTER — Telehealth: Payer: Self-pay | Admitting: Family Medicine

## 2023-02-02 NOTE — Telephone Encounter (Signed)
error 

## 2023-03-02 ENCOUNTER — Telehealth: Payer: Self-pay | Admitting: Family Medicine

## 2023-03-02 NOTE — Telephone Encounter (Signed)
Spouse says appointment no longer needed

## 2023-03-03 ENCOUNTER — Encounter: Payer: Self-pay | Admitting: Nurse Practitioner

## 2023-03-03 ENCOUNTER — Other Ambulatory Visit: Payer: Self-pay | Admitting: Nurse Practitioner

## 2023-03-03 ENCOUNTER — Ambulatory Visit (INDEPENDENT_AMBULATORY_CARE_PROVIDER_SITE_OTHER): Payer: Medicare Other | Admitting: Nurse Practitioner

## 2023-03-03 VITALS — BP 140/76 | HR 63 | Temp 98.9°F | Resp 18 | Wt 93.8 lb

## 2023-03-03 DIAGNOSIS — G40909 Epilepsy, unspecified, not intractable, without status epilepticus: Secondary | ICD-10-CM

## 2023-03-03 DIAGNOSIS — I10 Essential (primary) hypertension: Secondary | ICD-10-CM

## 2023-03-03 DIAGNOSIS — F03B4 Unspecified dementia, moderate, with anxiety: Secondary | ICD-10-CM

## 2023-03-03 DIAGNOSIS — R739 Hyperglycemia, unspecified: Secondary | ICD-10-CM | POA: Diagnosis not present

## 2023-03-03 LAB — HEMOGLOBIN A1C: Hgb A1c MFr Bld: 5.3 % (ref 4.6–6.5)

## 2023-03-03 LAB — BASIC METABOLIC PANEL
BUN: 23 mg/dL (ref 6–23)
CO2: 28 meq/L (ref 19–32)
Calcium: 9.5 mg/dL (ref 8.4–10.5)
Chloride: 104 meq/L (ref 96–112)
Creatinine, Ser: 0.61 mg/dL (ref 0.40–1.20)
GFR: 84.69 mL/min (ref >60.00)
Glucose, Bld: 105 mg/dL — ABNORMAL HIGH (ref 70–99)
Potassium: 4 meq/L (ref 3.5–5.1)
Sodium: 139 meq/L (ref 135–145)

## 2023-03-03 NOTE — Progress Notes (Signed)
Established Patient Visit  Patient: April Chung   DOB: 05/31/1945   78 y.o. Female  MRN: [id number] Visit Date: 03/03/2023  Subjective:    Chief Complaint  Patient presents with   Medical Managment for Chronic Issues     3 month follow up hypertension; shingles vaccine is due    HPI Seizure disorder (HCC) Keppra weaned off per neurology. No medication in the last 2weeks No seizure activity in last 8yrs.  Hypertension Home BP 120s/60s Current use of losartan 50mg  daily Reports daytime fatigue BP Readings from Last 3 Encounters:  03/03/23 (!) 140/76  12/23/22 131/82  12/15/22 122/64    Advised to Continue BP check at least 3x/week. Increase losartan dose to 50mg  if BP>140/80 Hold Losartan if BP<120/70 Call office if BP remains >140/80 even with 50mg  losartan dose F/up in 6months  Moderate dementia with anxiety, unspecified dementia type (HCC) Under the care of neurology Current use of namenda Lives in an Assisted Living facility with husband. He denies any change in mental status-short term memory deficit but stable. She is able to complete ADLs independently but needs supervision. Has shuffling gait, no change per husband. He also denies any falls in last 1year  Wt Readings from Last 3 Encounters:  03/03/23 93 lb 12.8 oz (42.5 kg)  12/23/22 95 lb (43.1 kg)  12/01/22 94 lb 12.8 oz (43 kg)     Reviewed medical, surgical, and social history today  Medications: Outpatient Medications Prior to Visit  Medication Sig   aspirin 81 MG tablet Take 81 mg by mouth daily.   brimonidine (ALPHAGAN) 0.15 % ophthalmic solution 1 drop 2 (two) times daily.   citalopram (CELEXA) 20 MG tablet TAKE 1 TABLET BY MOUTH EVERY DAY   COMIRNATY syringe    FLUAD 0.5 ML injection    losartan (COZAAR) 25 MG tablet Take 1 tablet (25 mg total) by mouth daily.   memantine (NAMENDA) 10 MG tablet Take 1 tablet (10 mg total) by mouth 2 (two) times daily.   Multiple Vitamins-Calcium  (ONE-A-DAY WOMENS PO) Take by mouth daily.   pravastatin (PRAVACHOL) 20 MG tablet TAKE 1 TABLET BY MOUTH AT BEDTIME   XELPROS 0.005 % EMUL Apply 1 drop to eye daily.   [DISCONTINUED] levETIRAcetam (KEPPRA) 250 MG tablet Take 125 mg by mouth 2 (two) times daily.   No facility-administered medications prior to visit.   Reviewed past medical and social history.   ROS per HPI above      Objective:  BP (!) 140/76 (BP Location: Left Arm, Patient Position: Sitting, Cuff Size: Small)   Pulse 63   Temp 98.9 F (37.2 C) (Temporal)   Resp 18   Wt 93 lb 12.8 oz (42.5 kg)   LMP 08/03/1993 (Approximate)   SpO2 97%   BMI 18.95 kg/m      Physical Exam Cardiovascular:     Rate and Rhythm: Normal rate and regular rhythm.     Pulses: Normal pulses.     Heart sounds: Normal heart sounds.  Pulmonary:     Effort: Pulmonary effort is normal.     Breath sounds: Normal breath sounds.  Neurological:     Mental Status: She is alert and oriented to person, place, and time.     No results found for any visits on 03/03/23.    Assessment & Plan:    Problem List Items Addressed This Visit     Hypertension -  Primary   Home BP 120s/60s Current use of losartan 50mg  daily Reports daytime fatigue BP Readings from Last 3 Encounters:  03/03/23 (!) 140/76  12/23/22 131/82  12/15/22 122/64    Advised to Continue BP check at least 3x/week. Increase losartan dose to 50mg  if BP>140/80 Hold Losartan if BP<120/70 Call office if BP remains >140/80 even with 50mg  losartan dose F/up in 6months      Relevant Orders   Basic metabolic panel   Moderate dementia with anxiety, unspecified dementia type (HCC)   Under the care of neurology Current use of namenda Lives in an Assisted Living facility with husband. He denies any change in mental status-short term memory deficit but stable. She is able to complete ADLs independently but needs supervision. Has shuffling gait, no change per husband. He also  denies any falls in last 1year      Seizure disorder (HCC)   Keppra weaned off per neurology. No medication in the last 2weeks No seizure activity in last 98yrs.      Other Visit Diagnoses       Hyperglycemia       Relevant Orders   Hemoglobin A1c      Return in about 6 months (around 08/31/2023) for HTN, hyperlipidemia (fasting).     Alysia Penna, NP

## 2023-03-03 NOTE — Assessment & Plan Note (Signed)
Home BP 120s/60s Current use of losartan 50mg  daily Reports daytime fatigue BP Readings from Last 3 Encounters:  03/03/23 (!) 140/76  12/23/22 131/82  12/15/22 122/64    Advised to Continue BP check at least 3x/week. Increase losartan dose to 50mg  if BP>140/80 Hold Losartan if BP<120/70 Call office if BP remains >140/80 even with 50mg  losartan dose F/up in 6months

## 2023-03-03 NOTE — Assessment & Plan Note (Signed)
Under the care of neurology Current use of namenda Lives in an Assisted Living facility with husband. He denies any change in mental status-short term memory deficit but stable. She is able to complete ADLs independently but needs supervision. Has shuffling gait, no change per husband. He also denies any falls in last 1year

## 2023-03-03 NOTE — Assessment & Plan Note (Signed)
Keppra weaned off per neurology. No medication in the last 2weeks No seizure activity in last 57yrs.

## 2023-03-03 NOTE — Patient Instructions (Signed)
Go to lab Maintain Heart healthy diet and daily exercise. Maintain current medications. Continue BP check at least 3x/week. Increase losartan dose to 50mg  if BP>140/80 Hold Losartan if BP<120/70 Call office if BP remains >140/80 even with 50mg  losartan dose

## 2023-03-04 ENCOUNTER — Other Ambulatory Visit: Payer: Self-pay | Admitting: Nurse Practitioner

## 2023-03-04 DIAGNOSIS — I10 Essential (primary) hypertension: Secondary | ICD-10-CM

## 2023-03-04 MED ORDER — LOSARTAN POTASSIUM 50 MG PO TABS: 50.0000 mg | ORAL_TABLET | Freq: Every day | ORAL | 3 refills | Status: DC

## 2023-03-04 MED ORDER — LOSARTAN POTASSIUM 25 MG PO TABS: 25.0000 mg | ORAL_TABLET | Freq: Every day | ORAL | 1 refills | Status: DC

## 2023-03-04 NOTE — Telephone Encounter (Signed)
Copied from CRM [id number]. Topic: Clinical - Medication Refill >> Mar 04, 2023  1:50 PM Eunice Blase wrote: Most Recent Primary Care Visit:  Provider: Anne Ng  Department: [facility]  Visit Type: OFFICE VISIT  Date: 12/01/2022  Medication: losartan (COZAAR) 50 MG tablet  Has the patient contacted their pharmacy? Yes (Agent: If no, request that the patient contact the pharmacy for the refill. If patient does not wish to contact the pharmacy document the reason why and proceed with request.) (Agent: If yes, when and what did the pharmacy advise?)  Is this the correct pharmacy for this prescription? Yes If no, delete pharmacy and type the correct one.  This is the patient's preferred pharmacy:  [facility] - [place], [place] - [address] [place] [address] Phone: [phone] Fax: [phone]   Has the prescription been filled recently? Yes  Is the patient out of the medication? Yes  Has the patient been seen for an appointment in the last year OR does the patient have an upcoming appointment? Yes  Can we respond through MyChart? Yes  Agent: Please be advised that Rx refills may take up to 3 business days. We ask that you follow-up with your pharmacy.

## 2023-03-06 ENCOUNTER — Encounter: Payer: Self-pay | Admitting: Nurse Practitioner

## 2023-03-11 ENCOUNTER — Ambulatory Visit: Payer: Medicare Other | Admitting: Neurology

## 2023-03-14 ENCOUNTER — Encounter: Payer: Self-pay | Admitting: Nurse Practitioner

## 2023-03-19 DIAGNOSIS — K080 Exfoliation of teeth due to systemic causes: Secondary | ICD-10-CM | POA: Diagnosis not present

## 2023-03-30 DIAGNOSIS — K080 Exfoliation of teeth due to systemic causes: Secondary | ICD-10-CM | POA: Diagnosis not present

## 2023-05-18 DIAGNOSIS — H401132 Primary open-angle glaucoma, bilateral, moderate stage: Secondary | ICD-10-CM | POA: Diagnosis not present

## 2023-08-05 DIAGNOSIS — K080 Exfoliation of teeth due to systemic causes: Secondary | ICD-10-CM | POA: Diagnosis not present

## 2023-08-31 ENCOUNTER — Ambulatory Visit: Payer: Medicare Other | Admitting: Nurse Practitioner

## 2023-09-10 ENCOUNTER — Ambulatory Visit: Admitting: Nurse Practitioner

## 2023-09-11 ENCOUNTER — Encounter: Payer: Self-pay | Admitting: Nurse Practitioner

## 2023-09-11 ENCOUNTER — Telehealth (INDEPENDENT_AMBULATORY_CARE_PROVIDER_SITE_OTHER): Admitting: Nurse Practitioner

## 2023-09-11 VITALS — BP 119/56 | HR 68 | Wt 103.3 lb

## 2023-09-11 DIAGNOSIS — F3341 Major depressive disorder, recurrent, in partial remission: Secondary | ICD-10-CM | POA: Diagnosis not present

## 2023-09-11 DIAGNOSIS — F03B4 Unspecified dementia, moderate, with anxiety: Secondary | ICD-10-CM

## 2023-09-11 DIAGNOSIS — R410 Disorientation, unspecified: Secondary | ICD-10-CM

## 2023-09-11 MED ORDER — CITALOPRAM HYDROBROMIDE 40 MG PO TABS: 40.0000 mg | ORAL_TABLET | Freq: Every day | ORAL | 3 refills | Status: DC

## 2023-09-11 NOTE — Assessment & Plan Note (Addendum)
 Mr. Schussler reports episodes of restlessness, confusion, hand tremor, and unsteady gait. Onset 80month ago, each episode last about . Occurs once a day. Unknown trigger Not related to meal time. No new medication of change in dose, no observed SOB or dyspnea or signs of pain or change in appetite or GU/GI function or change in sleep pattern per Mr. Klinke. Mrs. Kaczynski denies and headache or dizziness, or acute pain.  Increase celexa  to 40mg  Check CBC, CMP, THYROID , and UA. F/up in 2weeks

## 2023-09-11 NOTE — Progress Notes (Signed)
 Virtual Visit via Video Note  I connected withNAME@ on 09/11/23 at  1:00 PM EDT by a video enabled telemedicine application and verified that I am speaking with the correct person using two identifiers.  Location: Patient:Home Provider: Office Participants: patient, Husband and provider  I discussed the limitations of evaluation and management by telemedicine and the availability of in person appointments. I also discussed with the patient that there may be a patient responsible charge related to this service. The patient expressed understanding and agreed to proceed.  RR:ejwpr attacks per husband-caregiver  History of Present Illness:  Information provided by husband. Moderate dementia with anxiety, unspecified dementia type [facility]) Mr. Monroy reports episodes of restlessness, confusion, hand tremor, and unsteady gait. Onset 57month ago, each episode last about . Occurs once a day. Unknown trigger Not related to meal time. No new medication of change in dose, no observed SOB or dyspnea or signs of pain or change in appetite or GU/GI function or change in sleep pattern per Mr. Quizon. Mrs. Kump denies and headache or dizziness, or acute pain.  Increase celexa  to 40mg  Check CBC, CMP, THYROID , and UA. F/up in 2weeks  Wt Readings from Last 3 Encounters:  09/11/23 103 lb 4.8 oz (46.9 kg)  03/03/23 93 lb 12.8 oz (42.5 kg)  12/23/22 95 lb (43.1 kg)    BP Readings from Last 3 Encounters:  09/11/23 (!) 119/56  03/03/23 (!) 140/76  12/23/22 131/82    Observations/Objective: Physical Exam Vitals and nursing note reviewed.  Cardiovascular:     Rate and Rhythm: Normal rate.     Pulses: Normal pulses.  Pulmonary:     Effort: Pulmonary effort is normal.  Neurological:     Mental Status: She is alert.     Comments: Oriented to person and husband  Psychiatric:        Mood and Affect: Mood normal.        Behavior: Behavior normal.     Assessment and Plan: Capucine was seen today for  panic attack.  Diagnoses and all orders for this visit:  Moderate dementia with anxiety, unspecified dementia type (HCC) -     Cancel: Urinalysis w microscopic + reflex cultur -     Cancel: TSH -     Cancel: CBC with Differential/Platelet -     Cancel: Comprehensive metabolic panel with GFR -     CBC with Differential/Platelet; Future -     Comprehensive metabolic panel with GFR; Future -     TSH; Future -     Urinalysis w microscopic + reflex cultur; Future  Recurrent major depressive disorder, in partial remission (HCC) -     citalopram  (CELEXA ) 40 MG tablet; Take 1 tablet (40 mg total) by mouth daily.  Disorientation   Follow Up Instructions: See instructions above   I discussed the assessment and treatment plan with the patient. The patient was provided an opportunity to ask questions and all were answered. The patient agreed with the plan and demonstrated an understanding of the instructions.   The patient was advised to call back or seek an in-person evaluation if the symptoms worsen or if the condition fails to improve as anticipated.  Roselie Mood, NP

## 2023-09-11 NOTE — Patient Instructions (Addendum)
 Go to lab on 09/14/2023 at 1:20pm for blood draw and urine collection Increase celexa  to 40mg  daily Maintain upcoming appointment on 09/24/2023

## 2023-09-13 ENCOUNTER — Encounter: Payer: Self-pay | Admitting: Nurse Practitioner

## 2023-09-13 DIAGNOSIS — F03B4 Unspecified dementia, moderate, with anxiety: Secondary | ICD-10-CM

## 2023-09-13 DIAGNOSIS — F3341 Major depressive disorder, recurrent, in partial remission: Secondary | ICD-10-CM

## 2023-09-14 ENCOUNTER — Other Ambulatory Visit (INDEPENDENT_AMBULATORY_CARE_PROVIDER_SITE_OTHER)

## 2023-09-14 ENCOUNTER — Telehealth: Payer: Self-pay | Admitting: Nurse Practitioner

## 2023-09-14 ENCOUNTER — Encounter: Payer: Self-pay | Admitting: Family Medicine

## 2023-09-14 DIAGNOSIS — F03B4 Unspecified dementia, moderate, with anxiety: Secondary | ICD-10-CM

## 2023-09-14 DIAGNOSIS — G40909 Epilepsy, unspecified, not intractable, without status epilepticus: Secondary | ICD-10-CM

## 2023-09-14 DIAGNOSIS — R8290 Unspecified abnormal findings in urine: Secondary | ICD-10-CM | POA: Diagnosis not present

## 2023-09-14 LAB — CBC WITH DIFFERENTIAL/PLATELET
Basophils Absolute: 0 K/uL (ref 0.0–0.1)
Basophils Relative: 0.5 % (ref 0.0–3.0)
Eosinophils Absolute: 0.1 K/uL (ref 0.0–0.7)
Eosinophils Relative: 0.6 % (ref 0.0–5.0)
HCT: 36.5 % (ref 36.0–46.0)
Hemoglobin: 12 g/dL (ref 12.0–15.0)
Lymphocytes Relative: 18.4 % (ref 12.0–46.0)
Lymphs Abs: 1.4 K/uL (ref 0.7–4.0)
MCHC: 32.9 g/dL (ref 30.0–36.0)
MCV: 86.9 fl (ref 78.0–100.0)
Monocytes Absolute: 0.6 K/uL (ref 0.1–1.0)
Monocytes Relative: 7.2 % (ref 3.0–12.0)
Neutro Abs: 5.7 K/uL (ref 1.4–7.7)
Neutrophils Relative %: 73.3 % (ref 43.0–77.0)
Platelets: 303 K/uL (ref 150.0–400.0)
RBC: 4.2 Mil/uL (ref 3.87–5.11)
RDW: 13.5 % (ref 11.5–15.5)
WBC: 7.8 K/uL (ref 4.0–10.5)

## 2023-09-14 LAB — COMPREHENSIVE METABOLIC PANEL WITH GFR
ALT: 24 U/L (ref 0–35)
AST: 25 U/L (ref 0–37)
Albumin: 4.2 g/dL (ref 3.5–5.2)
Alkaline Phosphatase: 66 U/L (ref 39–117)
BUN: 23 mg/dL (ref 6–23)
CO2: 27 meq/L (ref 19–32)
Calcium: 9.2 mg/dL (ref 8.4–10.5)
Chloride: 105 meq/L (ref 96–112)
Creatinine, Ser: 0.56 mg/dL (ref 0.40–1.20)
GFR: 86.13 mL/min (ref >60.00)
Glucose, Bld: 99 mg/dL (ref 70–99)
Potassium: 4 meq/L (ref 3.5–5.1)
Sodium: 138 meq/L (ref 135–145)
Total Bilirubin: 0.5 mg/dL (ref 0.2–1.2)
Total Protein: 7.5 g/dL (ref 6.0–8.3)

## 2023-09-14 LAB — TSH: TSH: 1.73 u[IU]/mL (ref 0.35–5.50)

## 2023-09-14 MED ORDER — CITALOPRAM HYDROBROMIDE 20 MG PO TABS: 20.0000 mg | ORAL_TABLET | Freq: Every day | ORAL | 1 refills | Status: DC

## 2023-09-14 NOTE — Telephone Encounter (Signed)
 Copied from CRM [id number]. Topic: Clinical - Medical Advice >> Sep 14, 2023 12:03 PM Gennette ORN wrote: Reason for CRM: Patient's husband Signe or [place] says the dosage of medication having effect on her. The name of the medcation citalopram  (CELEXA ) 40 MG tablet .

## 2023-09-14 NOTE — Assessment & Plan Note (Signed)
 Increased restlessness with 3 doses of citalopram  40mg . Decrease dose to 20mg .  Home video indicates isolated hand tremor. No sign of agitation. Advised to f/up with neurology

## 2023-09-14 NOTE — Telephone Encounter (Signed)
 Called husband at [phone]. Reports tremors seem to be new in hand. Not similar to when she was first dx 2012/2013. Confirmed she is off of levetiracetam . Instructed to taper off at last visit due to being seizure free for so long.  She had VV w/ PCP 09/11/23. Does not feel it is panic attacks. Instructed her to increase Celexa  from 20mg  to 40mg . She experienced insomnia /confusion. PCP instructed her to go back to 20mg  today. Ordered labs/UA today.   I spoke w/ Amy. She recommended ordering EEG in office. Depending on results, may restart levetiracetam  if needed. Also wants to schedule appt with Dr. Gregg to establish care since she was previously followed by Dr. Jenel and has not seen another MD since. I realyed to husband. He was agreeable with plan. I scheduled appt with Dr. Gregg 10/06/23 at 8:45a, check in 8:15a. Ordered EEG.

## 2023-09-14 NOTE — Telephone Encounter (Signed)
 Patient and her husband were in the office when I spoke to him. He wanted to let you know that Mrs. April Chung did not sleep well last night that she kept him up. He wants to know if he should still double the medication in the morning like previously told to do. I also helped patient to shorten the video of Mrs. April Chung's hand trembling.

## 2023-09-15 ENCOUNTER — Ambulatory Visit: Payer: Self-pay | Admitting: Nurse Practitioner

## 2023-09-15 DIAGNOSIS — N3000 Acute cystitis without hematuria: Secondary | ICD-10-CM

## 2023-09-15 NOTE — Telephone Encounter (Signed)
 Spoke with patient's husband and scheduled EEG for 09/28/23 at 12:45pm and appt was added to wait list

## 2023-09-18 ENCOUNTER — Encounter: Payer: Self-pay | Admitting: Nurse Practitioner

## 2023-09-18 DIAGNOSIS — F03B4 Unspecified dementia, moderate, with anxiety: Secondary | ICD-10-CM

## 2023-09-18 LAB — URINALYSIS W MICROSCOPIC + REFLEX CULTURE
Bilirubin Urine: NEGATIVE
Glucose, UA: NEGATIVE
Hgb urine dipstick: NEGATIVE
Hyaline Cast: NONE SEEN /LPF
Nitrites, Initial: POSITIVE — AB
Protein, ur: NEGATIVE
RBC / HPF: NONE SEEN /HPF (ref 0–2)
Specific Gravity, Urine: 1.023 (ref 1.001–1.035)
pH: 5.5 (ref 5.0–8.0)

## 2023-09-18 LAB — URINE CULTURE
MICRO NUMBER:: 16815143
SPECIMEN QUALITY:: ADEQUATE

## 2023-09-18 LAB — CULTURE INDICATED

## 2023-09-18 MED ORDER — AMOXICILLIN-POT CLAVULANATE 875-125 MG PO TABS: 1.0000 | ORAL_TABLET | Freq: Two times a day (BID) | ORAL | 0 refills | Status: DC

## 2023-09-18 NOTE — Addendum Note (Signed)
 Addended by: KATHEEN ROSELIE CROME on: 09/18/2023 08:33 AM   Modules accepted: Orders

## 2023-09-18 NOTE — Telephone Encounter (Signed)
 Spoke with patient's husband Jada Fass who is [place]. He wants to know if maybe the Neurology appointment is not needed since she has a UTI and wants to know if the EEG appointment with Neurology is needed that is scheduled for next week

## 2023-09-21 ENCOUNTER — Ambulatory Visit: Payer: Self-pay

## 2023-09-21 NOTE — Telephone Encounter (Signed)
 FYI Only or Action Required?: Action required by provider: medication refill request and clinical question for provider.  Patient was last seen in primary care on 09/11/2023 by Nche, Roselie Rockford, NP.  Called Nurse Triage reporting Advice Only.  Symptoms began several days ago.  Interventions attempted: Prescription medications: ABx.  Symptoms are: gradually worsening.  Triage Disposition: No disposition on file.  Patient/caregiver understands and will follow disposition?:    Copied from CRM [id number]. Topic: Clinical - Red Word Triage >> Sep 21, 2023 12:31 PM Robinson H wrote: [facility] that prompted transfer to Nurse Triage: Patient is having increased agitation and confusing. Reason for Disposition  [1] Caller requesting NON-URGENT health information AND [2] PCP's office is the best resource    Routing encounter to PCP for reveiw  Answer Assessment - Initial Assessment Questions 1. REASON FOR CALL: What is the main reason for your call? or How can I best help you?    Pt's husband reaching out - wanted to make the PCP aware that pt received ABX and s/s began improving however now the increased agitation and confusing is about like it was in the beginning,  husband would like to know if pt can be switched to a different medication or what does PCP think should be the next steps for patient.  Please call husband back at [phone].   Husband reported patient stays up and night roaming. Pt nor husband are getting any sleep at night.  Protocols used: Information Only Call - No Triage-A-AH

## 2023-09-22 MED ORDER — HYDROXYZINE HCL 25 MG PO TABS: 12.5000 mg | ORAL_TABLET | Freq: Three times a day (TID) | ORAL | 0 refills | Status: DC | PRN

## 2023-09-22 NOTE — Addendum Note (Signed)
 Addended by: KATHEEN ROSELIE CROME on: 09/22/2023 08:14 AM   Modules accepted: Orders

## 2023-09-24 ENCOUNTER — Ambulatory Visit (INDEPENDENT_AMBULATORY_CARE_PROVIDER_SITE_OTHER): Admitting: Nurse Practitioner

## 2023-09-24 ENCOUNTER — Encounter: Payer: Self-pay | Admitting: Nurse Practitioner

## 2023-09-24 VITALS — BP 126/70 | HR 71 | Temp 98.4°F | Ht 60.0 in | Wt 104.4 lb

## 2023-09-24 DIAGNOSIS — F03B4 Unspecified dementia, moderate, with anxiety: Secondary | ICD-10-CM | POA: Diagnosis not present

## 2023-09-24 DIAGNOSIS — N3000 Acute cystitis without hematuria: Secondary | ICD-10-CM | POA: Diagnosis not present

## 2023-09-24 NOTE — Patient Instructions (Signed)
 Collect urine sample on Tuesday and return to lab on Tuesday. Maintain current med doses, but use vistaril  only at bedtime. Daytime dose should be as needed. Maintain adequate oral hydration

## 2023-09-24 NOTE — Assessment & Plan Note (Addendum)
 Improved agitation and insomnia with vistaril  12.5mg  BID Reports hallucination with periods of agitation Persistent unilateral hand tremor. She is schedule for an EEG on Monday.  Advised to use vistaril  at hs only Hold med on day before EEG.

## 2023-09-24 NOTE — Progress Notes (Signed)
 Established Patient Visit  Patient: April Chung   DOB: 03/01/1945   78 y.o. Female  MRN: [id number] Visit Date: 09/24/2023  Subjective:    Chief Complaint  Patient presents with   Follow-up    NOT FASTING 6 month follow up for HTN, Hyperlipidemia  Ask if retest of urine will be performed today  DUE for 2nd Zoster vaccine, and AWV   HPI Accompanied by Husband who provides most of the history.  Moderate dementia with anxiety, unspecified dementia type (HCC) Improved agitation and insomnia with vistaril  12.5mg  BID Reports hallucination with periods of agitation Persistent unilateral hand tremor. She is schedule for an EEG on Monday.  Advised to use vistaril  at hs only Hold med on day before EEG.  Wt Readings from Last 3 Encounters:  09/24/23 104 lb 6.4 oz (47.4 kg)  09/11/23 103 lb 4.8 oz (46.9 kg)  03/03/23 93 lb 12.8 oz (42.5 kg)    Reviewed medical, surgical, and social history today  Medications: Outpatient Medications Prior to Visit  Medication Sig   amoxicillin -clavulanate (AUGMENTIN ) 875-125 MG tablet Take 1 tablet by mouth 2 (two) times daily.   aspirin 81 MG tablet Take 81 mg by mouth daily.   brimonidine (ALPHAGAN) 0.15 % ophthalmic solution 1 drop 2 (two) times daily.   citalopram  (CELEXA ) 20 MG tablet Take 1 tablet (20 mg total) by mouth daily.   hydrOXYzine  (ATARAX ) 25 MG tablet Take 0.5 tablets (12.5 mg total) by mouth 3 (three) times daily as needed for anxiety. (Patient taking differently: Take 12.5 mg by mouth 2 (two) times daily as needed for anxiety.)   losartan  (COZAAR ) 50 MG tablet Take 1 tablet (50 mg total) by mouth daily.   memantine  (NAMENDA ) 10 MG tablet Take 1 tablet (10 mg total) by mouth 2 (two) times daily.   Multiple Vitamins-Calcium  (ONE-A-DAY WOMENS PO) Take by mouth daily.   pravastatin  (PRAVACHOL ) 20 MG tablet TAKE 1 TABLET BY MOUTH AT BEDTIME   XELPROS 0.005 % EMUL Apply 1 drop to eye daily.   COMIRNATY syringe    FLUAD 0.5  ML injection    No facility-administered medications prior to visit.   Reviewed past medical and social history.   ROS per HPI above Review of Systems  Constitutional:  Negative for chills, fever, malaise/fatigue and weight loss.  Gastrointestinal:  Negative for abdominal pain, blood in stool, constipation, diarrhea, melena and vomiting.  Musculoskeletal:  Negative for falls.  Neurological:  Negative for dizziness and weakness.  Psychiatric/Behavioral:  Positive for hallucinations.     Last CBC Lab Results  Component Value Date   WBC 7.8 09/14/2023   HGB 12.0 09/14/2023   HCT 36.5 09/14/2023   MCV 86.9 09/14/2023   MCH 31.8 07/12/2016   RDW 13.5 09/14/2023   PLT 303.0 09/14/2023   Last metabolic panel Lab Results  Component Value Date   GLUCOSE 99 09/14/2023   NA 138 09/14/2023   K 4.0 09/14/2023   CL 105 09/14/2023   CO2 27 09/14/2023   BUN 23 09/14/2023   CREATININE 0.56 09/14/2023   GFR 86.13 09/14/2023   CALCIUM  9.2 09/14/2023   PROT 7.5 09/14/2023   ALBUMIN 4.2 09/14/2023   LABGLOB 3.0 02/07/2016   AGRATIO 1.5 02/07/2016   BILITOT 0.5 09/14/2023   ALKPHOS 66 09/14/2023   AST 25 09/14/2023   ALT 24 09/14/2023   ANIONGAP 8 07/12/2016   Last thyroid  functions Lab  Results  Component Value Date   TSH 1.73 09/14/2023        Objective:  BP 126/70 (BP Location: Left Arm, Patient Position: Sitting, Cuff Size: Normal)   Pulse 71   Temp 98.4 F (36.9 C) (Oral)   Ht 5' (1.524 m)   Wt 104 lb 6.4 oz (47.4 kg)   LMP 08/03/1993 (Approximate)   SpO2 96%   BMI 20.39 kg/m      Physical Exam  No results found for any visits on 09/24/23.    Assessment & Plan:    Problem List Items Addressed This Visit     Moderate dementia with anxiety, unspecified dementia type (HCC)   Improved agitation and insomnia with vistaril  12.5mg  BID Reports hallucination with periods of agitation Persistent unilateral hand tremor. She is schedule for an EEG on Monday.  Advised  to use vistaril  at hs only Hold med on day before EEG.      Other Visit Diagnoses       Acute cystitis without hematuria    -  Primary   Relevant Orders   Urinalysis w microscopic + reflex cultur     Collect urine sample on Tuesday and return to lab on Tuesday. Maintain current med doses, but use vistaril  only at bedtime. Daytime dose should be as needed. Maintain adequate oral hydration  Return in about 6 months (around 03/26/2024) for anxiety, HTN, hyperlipidemia (fasting).     Roselie Mood, NP

## 2023-09-28 ENCOUNTER — Ambulatory Visit (INDEPENDENT_AMBULATORY_CARE_PROVIDER_SITE_OTHER): Admitting: Neurology

## 2023-09-28 DIAGNOSIS — G40909 Epilepsy, unspecified, not intractable, without status epilepticus: Secondary | ICD-10-CM | POA: Diagnosis not present

## 2023-09-28 NOTE — Procedures (Signed)
    History:  78 year old woman with seizure disorder   EEG classification: Awake and drowsy  Duration: 26 minutes   Technical aspects: This EEG study was done with scalp electrodes positioned according to the 10-20 International system of electrode placement. Electrical activity was reviewed with band pass filter of 1-70Hz , sensitivity of 7 uV/mm, display speed of 75mm/sec with a 60Hz  notched filter applied as appropriate. EEG data were recorded continuously and digitally stored.   Description of the recording: The background rhythms of this recording consists of a fairly well modulated medium amplitude alpha rhythm of 11 Hz that is reactive to eye opening and closure. Present in the anterior head region is a 15-20 Hz beta activity. Photic stimulation was performed, did not show any abnormalities. Hyperventilation was not performed. Drowsiness was manifested by background fragmentation. There was presence of left parasagittal sharps. There was left parasagittal focal slowing. There were no electrographic seizure identified.   Abnormality:  Left parasagittal sharps Left parasagittal focal slowing    Impression: This EEG is indicative of increase epileptogenic potential in the left parasagittal region and also suggestive of neuronal dysfunction in the left parasagittal region.    Ezequias Lard, MD [facility]

## 2023-09-29 ENCOUNTER — Ambulatory Visit: Payer: Self-pay | Admitting: Neurology

## 2023-09-29 DIAGNOSIS — N3000 Acute cystitis without hematuria: Secondary | ICD-10-CM | POA: Diagnosis not present

## 2023-09-29 DIAGNOSIS — G40909 Epilepsy, unspecified, not intractable, without status epilepticus: Secondary | ICD-10-CM

## 2023-09-29 NOTE — Progress Notes (Signed)
 Yes, please restart LEV 500 mg BID. Thanks

## 2023-09-30 ENCOUNTER — Ambulatory Visit: Payer: Self-pay | Admitting: Nurse Practitioner

## 2023-09-30 MED ORDER — LEVETIRACETAM 250 MG PO TABS: 250.0000 mg | ORAL_TABLET | Freq: Two times a day (BID) | ORAL | 0 refills | Status: DC

## 2023-09-30 NOTE — Telephone Encounter (Signed)
-----   Message from Greig Forbes sent at 09/30/2023  7:37 AM EDT ----- April Chung. Can you call Mr Amundson and let him know that I talked to Dr Gregg and based on her EEG results, we want to restart levetiracetam . She was previously taking 250mg  twice daily but Dr Gregg  suggested 500mg  BID. Have him restart 250mg  twice daily for 2 weeks then call me to let me know how she is tolerating. We may increase to 500mg  BID if she is doing well.  Have him let me know if she  has any trouble tolerating the medication.   ----- Message ----- From: Gregg Lek, MD Sent: 09/29/2023   4:53 PM EDT To: Greig Forbes, NP  Yes, please restart LEV 500 mg BID. Thanks  ----- Message ----- From: Forbes Greig, NP Sent: 09/29/2023   2:57 PM EDT To: Lek Gregg, MD  I have her coming to see you next month following Dr Jenel' retirement. She was started on lev following concerns of memory loss and abnormal EEG. No known seizures. I stopped lev and a few months  ago and husband now concerned of hand tremor and confusion are seizure related. EEG shows possibility. Is it ok to restart lev with her or do you wish to see her first?   ----- Message ----- From: Gregg Lek, MD Sent: 09/28/2023   3:37 PM EDT To: Amy Lomax, NP

## 2023-09-30 NOTE — Progress Notes (Signed)
 Sounds good. Thanks

## 2023-09-30 NOTE — Telephone Encounter (Signed)
 Called husband at [phone]. Relayed results per note. He verbalized understanding and agreeable to plan. Requested rx be sent to [facility] pharmacy. He will call back after her being on 250mg  po [id number] to let us  know how she is tolerating. I e-scribed rx to pharmacy. He will discuss further at appt with Dr. Gregg as well. Reminded him about appt w/ Dr. Gregg 10/06/23.   He also wanted to bring back up that wife has MCI and he has noticed repetitive action on Sunday afternoons where she will tell him she wants to go home. He reminds her they have been together 51 yr and that she is home. She will stop after that. She is continuing to have hand trembling but he feels she exaggerates sx some.   He wanted this update sent to MD for upcoming visit. He did not want to bring up in front of wife at visit. Aware I will forward to Dr. Gregg.

## 2023-10-01 LAB — URINALYSIS W MICROSCOPIC + REFLEX CULTURE
Bilirubin Urine: NEGATIVE
Glucose, UA: NEGATIVE
Hyaline Cast: NONE SEEN /LPF
Ketones, ur: NEGATIVE
Nitrites, Initial: NEGATIVE
Specific Gravity, Urine: 1.022 (ref 1.001–1.035)
pH: 6 (ref 5.0–8.0)

## 2023-10-01 LAB — URINE CULTURE
MICRO NUMBER:: 16885633
SPECIMEN QUALITY:: ADEQUATE

## 2023-10-01 LAB — CULTURE INDICATED

## 2023-10-06 ENCOUNTER — Ambulatory Visit (INDEPENDENT_AMBULATORY_CARE_PROVIDER_SITE_OTHER): Admitting: Neurology

## 2023-10-06 ENCOUNTER — Encounter: Payer: Self-pay | Admitting: Neurology

## 2023-10-06 VITALS — BP 124/80 | HR 84 | Ht 60.0 in | Wt 104.0 lb

## 2023-10-06 DIAGNOSIS — G40909 Epilepsy, unspecified, not intractable, without status epilepticus: Secondary | ICD-10-CM | POA: Diagnosis not present

## 2023-10-06 DIAGNOSIS — F03B4 Unspecified dementia, moderate, with anxiety: Secondary | ICD-10-CM

## 2023-10-06 MED ORDER — LEVETIRACETAM 250 MG PO TABS: 250.0000 mg | ORAL_TABLET | Freq: Two times a day (BID) | ORAL | 4 refills | Status: AC

## 2023-10-06 NOTE — Progress Notes (Signed)
 Chief Complaint  Patient presents with   New Patient (Initial Visit)    RM 12    HISTORY OF PRESENT ILLNESS:  10/06/23 April Chung Patient presents today for follow-up, she is accompanied by her husband.  Last visit was in November 2024 with April Chung.  Since then she has been doing well, she has stopped her levetiracetam , and no events concerning for seizures.  Her EEG was repeated showing left hemispheric slowing and left sided epileptiform discharges.  Levetiracetam  250 mg twice daily has been restarted.  While she was off levetiracetam , her husband has not seen any changes. Memory is stable per husband. They still live in the independent living facility.    INTERVAL HISTORY 12/23/2022 ALL: April Chung returns for follow up for seizures and memory loss. She was seen last 12/2021. We continued levetiracetam  250mg  and memantine  10mg  BID. Since, she continues to do well. Memory is stable. No seizures. Husband denies any know seizure events but was started on lev following concerns of memory loss with abnormal EEG. He has never known her to have an actual seizure event. She has adjusted to living at [facility]. Appetite is good. She has lost 11lbs but unclear why. She is followed closely by PCP. She drinks Ensure daily. She sleeps well. April Chung. She does not drive.   11:28/2023 ALL: April Chung returns for follow up for seizures and memory loss. She continues levetiracetam  250mg  BID and memantine  10mg  BID. She is doing well. No significant changes since last year. She and her husband now live in [facility] independent living. She is a little less active outside but continues to stay active around her home. She is able to perform ADLs. She does not drive. Memory is stable. No seizures.   12/25/2020 ALL: April Chung returns for follow up for seizures and memory loss. She continues levetiracetam  250mg  BID and memantine  10mg  BID. She continues to do well. She is tolerating meds. Memory is stable. No  significant changes. She denies falls. Appetite is good. She has lost about 3 pounds over the year but contributes this to eating healthier foods. She continues to perform ADLs independently. She is able to manage her own medications. She walks her dogs every day and tries to walk around in the yard when she can. She enjoys crossword puzzles. She drinks about 1/2 glass of merlot every night.   12/21/2019 ALL:  April Chung is a 78 y.o. female here today for follow up for seizures and memory loss. She continues levetiracetam  250mg  BID and Namenda  10mg  BID. She presents with her husband who aids in history. She feels she is doing well and husband agrees. She continues to be fairly independent. April Chung helps with meal preparation and medications. She is able to perform ADL's. She is less active than she used to be but continues to walk daily. No seizure activity.    HISTORY (copied from previous note)  April Chung is a 78 y.o. female here today for follow up of seizure ans memory loss. She continues Keppra  250mg  twice daily. She is also taking Namenda  10mg  twice daily. She was unable to tolerate Aricept due to low heart rate.  Overall, she feels that she is doing very well.  Her husband is with her today and agrees.  They feel that memory is fairly stable.  She is able to perform ADLs independently.  She does not drive.  She is not as active as she used to be.  Her husband mentions  that walking has become much slower and pace.  He states that she seems stable in balance but moves her feet much slower than she used to.  He denies shuffling or stooped posture.  He has considered having her to use a walking stick but is concerned that she will not use it.  There have been no falls.  No strokelike symptoms.  No seizure activity.   She and her husband deny concerns of elevated blood pressures at home.  They admit that they do not routinely check.  Blood pressure was normal in the office with Megan in  January/2020.  Initial blood pressure reading in office today was 182/100.  At completion of visit we recheck blood pressure and it is 151/77.  Her husband feels that normal blood pressure readings are 130s over 70s to 80s.  She is followed by primary care for hypertension.  She is currently taking Cozaar  50 mg daily as well as Norvasc  2.5 mg daily.  She denies headaches, dizziness, chest pain or trouble breathing.   HISTORY: (copied from [place] note on 02/11/2018)   April Chung is a 78 year old right handed female who presents today for follow up regarding seizures and memory loss. She is taking Namenda  10mg  twice daily. She had to stop Aricept due to decreased heart rate. April Chung states that he has not been able to tell much of a difference since stopping Aricept with the exception of slower movements. She is tolerating Namenda  well. MMSE today is 21/30. In 03/2017 she scored 27/30. She continues to perform all ADL's independently. April Chung works during the day and she is doing well staying home alone. He does assist with dosing medications. She has become reclusive and typically does not like to go out much per her husband.   She is taking Keppra  250mg  twice daily. She denies seizure like activity. She is tolerating Keppra  well without excessive drowsiness.    HISTORY: (copied from April Chung' note from 02/10/2017)   April Chung is a 78 year old right-handed white female with a history of a mild memory disturbance and history of seizures.  The patient is on Aricept taking 10 mg daily, she is on Namenda  taking 10 mg twice daily.  She tolerates these medications well.  She is on low-dose Keppra , she tolerates this drug also without drowsiness.  She has not had any recurring seizures since last seen, she does operate a motor vehicle without difficulty.  The patient has not noted any progression of balance issues, she denies any progression of memory.  She returns to this office for an evaluation  REVIEW  OF SYSTEMS: Out of a complete 14 system review of symptoms, the patient complains only of the following symptoms, memory loss and all other reviewed systems are negative.   ALLERGIES: Allergies  Allergen Reactions   Aricept [Donepezil Hcl] Other (See Comments)    Low heart rate, do not take per cardiologist   Plaquenil [Hydroxychloroquine Sulfate]    Hydroxychloroquine Rash     HOME MEDICATIONS: Outpatient Medications Prior to Visit  Medication Sig Dispense Refill   aspirin 81 MG tablet Take 81 mg by mouth daily.     brimonidine (ALPHAGAN) 0.15 % ophthalmic solution 1 drop 2 (two) times daily.     citalopram  (CELEXA ) 20 MG tablet Take 1 tablet (20 mg total) by mouth daily. 90 tablet 1   COMIRNATY syringe      FLUAD 0.5 ML injection      hydrOXYzine  (ATARAX ) 25 MG  tablet Take 0.5 tablets (12.5 mg total) by mouth 3 (three) times daily as needed for anxiety. (Patient taking differently: Take 12.5 mg by mouth 2 (two) times daily as needed for anxiety.) 30 tablet 0   losartan  (COZAAR ) 50 MG tablet Take 1 tablet (50 mg total) by mouth daily. 90 tablet 3   memantine  (NAMENDA ) 10 MG tablet Take 1 tablet (10 mg total) by mouth 2 (two) times daily. 180 tablet 3   Multiple Vitamins-Calcium  (ONE-A-DAY WOMENS PO) Take by mouth daily.     pravastatin  (PRAVACHOL ) 20 MG tablet TAKE 1 TABLET BY MOUTH AT BEDTIME 90 tablet 3   XELPROS 0.005 % EMUL Apply 1 drop to eye daily.     levETIRAcetam  (KEPPRA ) 250 MG tablet Take 1 tablet (250 mg total) by mouth 2 (two) times daily. 60 tablet 0   amoxicillin -clavulanate (AUGMENTIN ) 875-125 MG tablet Take 1 tablet by mouth 2 (two) times daily. 14 tablet 0   No facility-administered medications prior to visit.     PAST MEDICAL HISTORY: Past Medical History:  Diagnosis Date   Abnormal EEG 01/14/2012   started on Keppra    Cataract    Depression 6 yrs ago   Dermatomyositis (HCC)    remission on pred until 2008, rheum at [facility]   GERD  (gastroesophageal reflux disease)    agravated with Fosamax   Glaucoma    Hypertension 02/04/2011   Memory loss    PMB (postmenopausal bleeding) 08/04/2003   Seizures (HCC)    Status post dilation of esophageal narrowing    Urinary incontinence, mixed 07/01/2011   Fitted for 2.75  Ring Pessary with support   Vitamin D  deficiency disease      PAST SURGICAL HISTORY: Past Surgical History:  Procedure Laterality Date   COLONOSCOPY  09/25/2004   April. Kristie   COLONOSCOPY  02/03/2009   ENDOMETRIAL BIOPSY  08/24/2003   weak proliferative endo, [facility] neg.   ESOPHAGOGASTRODUODENOSCOPY ENDOSCOPY  04/29/2004   anemia without cause   EYE SURGERY     NO PAST SURGERIES       FAMILY HISTORY: Family History  Problem Relation Age of Onset   Dementia Father        died age 20, otherwise healthy   Hypertension Mother    Heart disease Maternal Grandfather    Heart disease Maternal Grandmother    Seizures Neg Hx      SOCIAL HISTORY: Social History   Socioeconomic History   Marital status: Married    Spouse name: Lynwood   Number of children: 1   Years of education: hs   Highest education level: Associate degree: occupational, Scientist, product/process development, or vocational program  Occupational History   Occupation: housewife  Tobacco Use   Smoking status: Former    Current packs/day: 0.00    Average packs/day: 1 pack/day for 15.0 years (15.0 ttl pk-yrs)    Types: Cigarettes    Start date: 02/03/1961    Quit date: 02/04/1976    Years since quitting: 47.7   Smokeless tobacco: Never  Vaping Use   Vaping status: Never Used  Substance and Sexual Activity   Alcohol use: Not Currently    Alcohol/week: 7.0 standard drinks of alcohol    Types: 7 Glasses of wine per week    Comment: not actively   Drug use: No   Sexual activity: Never    Partners: Male    Birth control/protection: Post-menopausal  Other Topics Concern   Not on file  Social History Narrative  Married, lives with spouse Sydnee).    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   Patient has one child.   Patient is right-handed.   Patient drinks 2-3 cups of coffee daily and maybe half cup at night.               Social Drivers of Corporate investment banker Strain: Low Risk  (09/21/2023)   Overall Financial Resource Strain (CARDIA)    Difficulty of Paying Living Expenses: Not hard at all  Food Insecurity: No Food Insecurity (09/21/2023)   Hunger Vital Sign    Worried About Running Out of Food in the Last Year: Never true    Ran Out of Food in the Last Year: Never true  Transportation Needs: No Transportation Needs (09/21/2023)   PRAPARE - Administrator, Civil Service (Medical): No    Lack of Transportation (Non-Medical): No  Physical Activity: Inactive (09/21/2023)   Exercise Vital Sign    Days of Exercise per Week: 0 days    Minutes of Exercise per Session: Not on file  Stress: No Stress Concern Present (09/21/2023)   [employer] of Occupational Health - Occupational Stress Questionnaire    Feeling of Stress: Not at all  Social Connections: Moderately Isolated (09/21/2023)   Social Connection and Isolation Panel    Frequency of Communication with Friends and Family: Three times a week    Frequency of Social Gatherings with Friends and Family: Twice a week    Attends Religious Services: Never    Database administrator or Organizations: No    Attends Engineer, structural: Not on file    Marital Status: Married  Catering manager Violence: Not At Risk (10/27/2022)   Humiliation, Afraid, Rape, and Kick questionnaire    Fear of Current or Ex-Partner: No    Emotionally Abused: No    Physically Abused: No    Sexually Abused: No      PHYSICAL EXAM  Vitals:   10/06/23 0843  BP: 124/80  Pulse: 84  SpO2: 96%  Weight: 104 lb (47.2 kg)  Height: 5' (1.524 m)      Body mass index is 20.31 kg/m.   Generalized: Well  developed, in no acute distress  Cardiology: normal rate and rhythm, no murmur auscultated  Respiratory: clear to auscultation bilaterally    Neurological examination  Mentation: Alert , not oriented to time, oriented to city and state, oriented to some history taking. Follows all commands speech and language fluent Cranial nerve II-XII: Pupils were equal round reactive to light. Extraocular movements were full, visual field were full on confrontational test. Facial sensation and strength were normal. Head turning and shoulder shrug  were normal and symmetric. Motor: The motor testing reveals 5 over 5 strength of all 4 extremities. Good symmetric motor tone is noted throughout.  Sensory: Sensory testing is intact to soft touch on all 4 extremities. No evidence of extinction is noted.  Coordination: Cerebellar testing reveals good finger-nose-finger and heel-to-shin bilaterally.  Gait and station: Gait is short and wide with external rotation noted of bilateral feet, reduced arm swing, tandem not attempted. Mild shuffling, using a walker today.      DIAGNOSTIC DATA (LABS, IMAGING, TESTING) - I reviewed patient records, labs, notes, testing and imaging myself where available.  Lab Results  Component Value Date   WBC 7.8 09/14/2023   HGB 12.0 09/14/2023   HCT 36.5 09/14/2023  MCV 86.9 09/14/2023   PLT 303.0 09/14/2023      Component Value Date/Time   NA 138 09/14/2023 1328   NA 140 09/08/2017 1437   K 4.0 09/14/2023 1328   CL 105 09/14/2023 1328   CO2 27 09/14/2023 1328   GLUCOSE 99 09/14/2023 1328   BUN 23 09/14/2023 1328   BUN 19 09/08/2017 1437   CREATININE 0.56 09/14/2023 1328   CALCIUM  9.2 09/14/2023 1328   PROT 7.5 09/14/2023 1328   PROT 7.5 02/07/2016 1256   ALBUMIN 4.2 09/14/2023 1328   ALBUMIN 4.5 02/07/2016 1256   AST 25 09/14/2023 1328   ALT 24 09/14/2023 1328   ALKPHOS 66 09/14/2023 1328   BILITOT 0.5 09/14/2023 1328   BILITOT 0.5 02/07/2016 1256   GFRNONAA 91  09/08/2017 1437   GFRAA 104 09/08/2017 1437   Lab Results  Component Value Date   CHOL 140 08/29/2022   HDL 52.00 08/29/2022   LDLCALC 73 08/29/2022   LDLDIRECT 129.0 02/09/2017   TRIG 76.0 08/29/2022   CHOLHDL 3 08/29/2022   Lab Results  Component Value Date   HGBA1C 5.3 03/03/2023   Lab Results  Component Value Date   VITAMINB12 983 02/07/2016   Lab Results  Component Value Date   TSH 1.73 09/14/2023      12/31/2021    2:00 PM 08/29/2021    2:18 PM 11/23/2018    2:32 PM  MMSE - Mini Mental State Exam  Not completed: Unable to complete Unable to complete   Orientation to time   2  Orientation to Place   3  Registration   3  Attention/ Calculation   2  Recall   1  Language- name 2 objects   2  Language- repeat   1  Language- follow 3 step command   3  Language- read & follow direction   1  Write a sentence   1  Copy design   1  Copy design-comments   named 5 animals  Total score   20   EEG 09/28/2023 Left parasagittal sharps Left parasagittal focal slowing   ASSESSMENT AND PLAN  78 y.o. year old female  has a past medical history of Abnormal EEG (01/14/2012), Cataract, Depression (6 yrs ago), Dermatomyositis (HCC), GERD (gastroesophageal reflux disease), Glaucoma, Hypertension (02/04/2011), Memory loss, PMB (postmenopausal bleeding) (08/04/2003), Seizures (HCC), Status post dilation of esophageal narrowing, Urinary incontinence, mixed (07/01/2011), and Vitamin D  deficiency disease. here with   Seizure disorder (HCC) - Plan: levETIRAcetam  (KEPPRA ) 250 MG tablet  Most recent EEG was abnormal, showing left hemispheric epileptiform discharges and focal slowing.  Keppra  250 mg restarted.  No seizures or seizure-like events.  Plan will be for patient to continue with Keppra .  They will continue follow-up with April Chung as scheduled in November.  Return sooner if worse.    I have spent a total of 45 minutes dedicated to this patient today, preparing to see patient,  performing a medically appropriate examination and evaluation, ordering tests and/or medications and procedures, and counseling and educating the patient/family/caregiver; independently interpreting result and communicating results to the family/patient/caregiver; and documenting clinical information in the electronic medical record.   Pastor Falling, MD 10/06/2023, 10:17 AM  [facility] [address], [address] [place], [place] [address] [phone]

## 2023-10-18 ENCOUNTER — Encounter: Payer: Self-pay | Admitting: Nurse Practitioner

## 2023-10-18 ENCOUNTER — Encounter: Payer: Self-pay | Admitting: Family Medicine

## 2023-10-19 ENCOUNTER — Encounter: Payer: Self-pay | Admitting: Emergency Medicine

## 2023-10-19 ENCOUNTER — Ambulatory Visit (INDEPENDENT_AMBULATORY_CARE_PROVIDER_SITE_OTHER): Admitting: Emergency Medicine

## 2023-10-19 VITALS — BP 110/62 | HR 74 | Temp 98.3°F | Resp 16 | Ht 60.0 in | Wt 104.0 lb

## 2023-10-19 DIAGNOSIS — N390 Urinary tract infection, site not specified: Secondary | ICD-10-CM

## 2023-10-19 DIAGNOSIS — R413 Other amnesia: Secondary | ICD-10-CM | POA: Diagnosis not present

## 2023-10-19 LAB — POCT URINALYSIS DIPSTICK
Bilirubin, UA: NEGATIVE
Ketones, UA: NEGATIVE
Nitrite, UA: POSITIVE
Protein, UA: NEGATIVE
Spec Grav, UA: 1.015 (ref 1.010–1.025)
Urobilinogen, UA: 0.2 U/dL
pH, UA: 5 (ref 5.0–8.0)

## 2023-10-19 MED ORDER — CEFDINIR 300 MG PO CAPS: 300.0000 mg | ORAL_CAPSULE | Freq: Two times a day (BID) | ORAL | 0 refills | Status: DC

## 2023-10-19 NOTE — Progress Notes (Signed)
 Assessment & Plan:   1. Acute UTI (Primary) - cefdinir  (OMNICEF ) 300 MG capsule; Take 1 capsule (300 mg total) by mouth 2 (two) times daily for 10 days.  Dispense: 20 capsule; Refill: 0  2. Memory loss - POCT urinalysis dipstick - Urine Culture     Results for orders placed or performed in visit on 10/19/23  POCT urinalysis dipstick  Result Value Ref Range   Color, UA     Clarity, UA     Glucose, UA     Bilirubin, UA neg    Ketones, UA neg    Spec Grav, UA 1.015 1.010 - 1.025   Blood, UA trace    pH, UA 5.0 5.0 - 8.0   Protein, UA Negative Negative   Urobilinogen, UA 0.2 0.2 or 1.0 E.U./dL   Nitrite, UA positive    Leukocytes, UA Large (3+) (A) Negative   Appearance     Odor      Urine dipstick as above shows positive for nitrates and positive for leukocytes.  C/w UTI No sign of pyelo or urosepsis Last had augmentin  6mo ago. Appears insurance coverage of cefpodoxime is poor, will rx cefdinir  with prior culture suggesting 3rd gen cephalosporin will provide coverage Urgent recheck if new or worsening sx.    Corean Geralds, MSPAS, PA-C     Subjective:  HPI:   April Chung is a 78 y.o. female with hx memory loss BIB spouse with concern for acute UTI 2 nights ago woke around 1230 and got dressed with desire to leave the house, atypical for her, and has happened before when she had a UTI. Yesterday more confused than her normal, though last night and today are back to her baseline. Appetite fine, no fevers, no c/o pain or any emesis.  Last UTI 6mo ago, Klebsiella, completed 1 week augmentin  with subsequent culture showing mixed flora.     ROS: Pertinent components reviewed in HPI and are otherwise negative unless specifically indicated above.   Relevant past medical history reviewed and updated as indicated.   Allergies and medications reviewed and updated.   Current Outpatient Medications:    aspirin 81 MG tablet, Take 81 mg by mouth daily., Disp: , Rfl:     brimonidine (ALPHAGAN) 0.15 % ophthalmic solution, 1 drop 2 (two) times daily., Disp: , Rfl:    cefdinir  (OMNICEF ) 300 MG capsule, Take 1 capsule (300 mg total) by mouth 2 (two) times daily for 10 days., Disp: 20 capsule, Rfl: 0   citalopram  (CELEXA ) 20 MG tablet, Take 1 tablet (20 mg total) by mouth daily., Disp: 90 tablet, Rfl: 1   COMIRNATY syringe, , Disp: , Rfl:    hydrOXYzine  (ATARAX ) 25 MG tablet, Take 0.5 tablets (12.5 mg total) by mouth 3 (three) times daily as needed for anxiety., Disp: 30 tablet, Rfl: 0   levETIRAcetam  (KEPPRA ) 250 MG tablet, Take 1 tablet (250 mg total) by mouth 2 (two) times daily., Disp: 180 tablet, Rfl: 4   losartan  (COZAAR ) 50 MG tablet, Take 1 tablet (50 mg total) by mouth daily., Disp: 90 tablet, Rfl: 3   memantine  (NAMENDA ) 10 MG tablet, Take 1 tablet (10 mg total) by mouth 2 (two) times daily., Disp: 180 tablet, Rfl: 3   Multiple Vitamins-Calcium  (ONE-A-DAY WOMENS PO), Take by mouth daily., Disp: , Rfl:    pravastatin  (PRAVACHOL ) 20 MG tablet, TAKE 1 TABLET BY MOUTH AT BEDTIME, Disp: 90 tablet, Rfl: 3   XELPROS 0.005 % EMUL, Apply 1 drop to eye  daily., Disp: , Rfl:    FLUAD 0.5 ML injection, , Disp: , Rfl:   Allergies  Allergen Reactions   Aricept [Donepezil Hcl] Other (See Comments)    Low heart rate, do not take per cardiologist   Plaquenil [Hydroxychloroquine Sulfate]    Hydroxychloroquine Rash and Other (See Comments)             Objective:   Today's Vitals   10/19/23 1058  BP: 110/62  Pulse: 74  Resp: 16  Temp: 98.3 F (36.8 C)  SpO2: 97%  Weight: 104 lb (47.2 kg)  Height: 5' (1.524 m)    General: Appears well, in no acute distress. VS as above.  Head: Oral mucosa moist  CV: RRR  Pulm: Normal respiratory effort and excursion  Abd: soft, non-tender, no guarding, normal bowel sounds. No CVAT.  Mentation at baseline

## 2023-10-21 LAB — URINE CULTURE
MICRO NUMBER:: 16969149
SPECIMEN QUALITY:: ADEQUATE

## 2023-10-22 ENCOUNTER — Ambulatory Visit: Payer: Self-pay

## 2023-10-22 ENCOUNTER — Ambulatory Visit: Payer: Self-pay | Admitting: Emergency Medicine

## 2023-10-22 NOTE — Telephone Encounter (Signed)
 FYI Only or Action Required?: Action required by provider: update on patient condition.  Patient was last seen in primary care on 10/19/2023 by Waddell Krabbe, PA-C.  Called Nurse Triage reporting Medication Reaction.  Symptoms began yesterday.  Interventions attempted: Nothing.  Symptoms are: unchanged.  Triage Disposition: Home Care  Patient/caregiver understands and will follow disposition?: Yes, will follow disposition  Message from Ahlexyia S sent at 10/22/2023  9:16 AM EDT  Pt husband called in stating that pt is reacting bad to the med cefdinir  (OMNICEF ) 300 MG capsule. Pt is having bad diarrhea and the only thing she had for dinner was a spinach salad.   Reason for Disposition  [1] MILD diarrhea AND [2] taking antibiotics  Answer Assessment - Initial Assessment Questions 1. ANTIBIOTIC: What antibiotic are you taking? How many times per day?     omnicef  2. ANTIBIOTIC ONSET: When was the antibiotic started?     About 4 days ago was started 3. DIARRHEA SEVERITY: How bad is the diarrhea? How many more stools have you had in the past 24 hours than normal?      One episode but caregiver/husband states it was bad 4. ONSET: When did the diarrhea begin?      yesterday 5. BM CONSISTENCY: How loose or watery is the diarrhea?      loose 6. VOMITING: Are you also vomiting? If Yes, ask: How many times in the past 24 hours?      denies 7. ABDOMEN PAIN: Are you having any abdomen pain? If Yes, ask: What does it feel like? (e.g., crampy, dull, intermittent, constant)      denies 8. ABDOMEN PAIN SEVERITY: If present, ask: How bad is the pain?  (e.g., Scale 1-10; mild, moderate, or severe)     denies 9. ORAL INTAKE: If vomiting, Have you been able to drink liquids? How much liquids have you had in the past 24 hours?     Drinking fluids 10. HYDRATION: Any signs of dehydration? (e.g., dry mouth [not just dry lips], too weak to stand, dizziness, new weight  loss) When did you last urinate?       Some water 11. EXPOSURE: Have you traveled to a foreign country recently? Have you been exposed to anyone with diarrhea? Could you have eaten any food that was spoiled?       Ate spinach salad 12. OTHER SYMPTOMS: Do you have any other symptoms? (e.g., fever, blood in stool)       denies   Pt was rx'd omnicef  for UTI, per husband pt has had diarrhea.  Protocols used: Diarrhea on Antibiotics-A-AH

## 2023-10-23 ENCOUNTER — Ambulatory Visit: Payer: Self-pay

## 2023-10-23 NOTE — Telephone Encounter (Signed)
 MyChart message read by patient April Chung at 9:13AM on 10/23/2023.

## 2023-10-23 NOTE — Telephone Encounter (Signed)
 FYI Only or Action Required?: Action required by provider: Husband would like a call back due to the antibiotic causing diarrhea.  He is begging to have a call back today.  Patient was last seen in primary care on 10/19/2023 by Waddell Krabbe, PA-C.  Called Nurse Triage reporting Diarrhea.  Symptoms began several days ago.  Interventions attempted: Nothing.  Symptoms are: unchanged.  Triage Disposition: See HCP Within 4 Hours (Or PCP Triage)  Patient/caregiver understands and will follow disposition?: No, wishes to speak with PCP      Copied from CRM #[id number]. Topic: Clinical - Red Word Triage >> Oct 23, 2023  8:14 AM Berneda FALCON wrote: Red Word that prompted transfer to Nurse Triage: Husband Signe has called in stating this patient has severe diarrhea since Tuesday, so much so that he had to put her in adult diapers as she is unable to control her bowels. States this is a direct result of the new medication she was put on, on Tuesday (med cefdinir  (OMNICEF ) 300 MG capsule).  Pt called in and talked with a nurse yesterday, but did not get a definitive response, only that the PCP said the medication was appropriate-although she is suffering with this side effect and husband states it is only getting worse.  Is concerned as they are both elderly and he is unable to continue caring for her in this capacity and does not wish her to suffer all weekend with this without an answer as to what they should do. Reason for Disposition  [1] SEVERE diarrhea (e.g., 7 or more times / day more than normal) AND [2] age > 60 years  Answer Assessment - Initial Assessment Questions 1. DIARRHEA SEVERITY: How bad is the diarrhea? How many more stools have you had in the past 24 hours than normal?      constant 2. ONSET: When did the diarrhea begin?      On Monday, started taking Omnicef  300 mg, diarrhea started Tuesday 3. STOOL DESCRIPTION:  How loose or watery is the diarrhea? What is the stool  color? Is there any blood or mucous in the stool?     watery 4. VOMITING: Are you also vomiting? If Yes, ask: How many times in the past 24 hours?      denies 5. ABDOMEN PAIN: Are you having any abdomen pain? If Yes, ask: What does it feel like? (e.g., crampy, dull, intermittent, constant)      denies 6. ABDOMEN PAIN SEVERITY: If present, ask: How bad is the pain?  (e.g., Scale 1-10; mild, moderate, or severe)     denies 7. ORAL INTAKE: If vomiting, Have you been able to drink liquids? How much liquids have you had in the past 24 hours?     Yes, and protein drinks 8. HYDRATION: Any signs of dehydration? (e.g., dry mouth [not just dry lips], too weak to stand, dizziness, new weight loss) When did you last urinate?     denies 9. EXPOSURE: Have you traveled to a foreign country recently? Have you been exposed to anyone with diarrhea? Could you have eaten any food that was spoiled?     no 10. ANTIBIOTIC USE: Are you taking antibiotics now or have you taken antibiotics in the past 2 months?       Yes, see above 11. OTHER SYMPTOMS: Do you have any other symptoms? (e.g., fever, blood in stool)       denies 12. PREGNANCY: Is there any chance you are pregnant? When was your last  menstrual period?       na  Protocols used: [facility]

## 2023-10-26 ENCOUNTER — Other Ambulatory Visit: Payer: Self-pay | Admitting: Nurse Practitioner

## 2023-10-26 ENCOUNTER — Encounter: Payer: Self-pay | Admitting: Nurse Practitioner

## 2023-10-26 DIAGNOSIS — R197 Diarrhea, unspecified: Secondary | ICD-10-CM

## 2023-10-26 DIAGNOSIS — F03B4 Unspecified dementia, moderate, with anxiety: Secondary | ICD-10-CM

## 2023-10-27 ENCOUNTER — Ambulatory Visit (INDEPENDENT_AMBULATORY_CARE_PROVIDER_SITE_OTHER): Admitting: Internal Medicine

## 2023-10-27 ENCOUNTER — Encounter: Payer: Self-pay | Admitting: Internal Medicine

## 2023-10-27 ENCOUNTER — Ambulatory Visit: Payer: Self-pay

## 2023-10-27 VITALS — BP 110/62 | HR 63 | Temp 97.8°F | Ht 60.0 in | Wt 105.6 lb

## 2023-10-27 DIAGNOSIS — R197 Diarrhea, unspecified: Secondary | ICD-10-CM

## 2023-10-27 DIAGNOSIS — N390 Urinary tract infection, site not specified: Secondary | ICD-10-CM | POA: Diagnosis not present

## 2023-10-27 NOTE — Telephone Encounter (Signed)
  FYI Only or Action Required?: FYI only for provider.  Patient was last seen in primary care on 10/19/2023 by Waddell Krabbe, PA-C.  Called Nurse Triage reporting Diarrhea.  Symptoms began several weeks ago.  Interventions attempted: Nothing.  Symptoms are: gradually worsening.  Triage Disposition: See Physician Within 24 Hours  Patient/caregiver understands and will follow disposition?: Yes    Copied from CRM [id number]. Topic: Clinical - Red Word Triage >> Oct 27, 2023  9:12 AM April Chung wrote: Red Word that prompted transfer to Nurse Triage: patient husband called stating the patient can longer control her bowels or get to the bathroom in time and she is sleeping all the time Reason for Disposition  [1] MODERATE diarrhea (e.g., 4-6 times / day more than normal) AND [2] age > 70 years  Answer Assessment - Initial Assessment Questions 1. DIARRHEA SEVERITY: How bad is the diarrhea? How many more stools have you had in the past 24 hours than normal?      3 times 2. ONSET: When did the diarrhea begin?      A week ago after starting an antibiotic for yeast UTI 3. STOOL DESCRIPTION:  How loose or watery is the diarrhea? What is the stool color? Is there any blood or mucous in the stool?     Unknown, states just such a mess. 4. VOMITING: Are you also vomiting? If Yes, ask: How many times in the past 24 hours?      denies 5. ABDOMEN PAIN: Are you having any abdomen pain? If Yes, ask: What does it feel like? (e.g., crampy, dull, intermittent, constant)      denies 6. ABDOMEN PAIN SEVERITY: If present, ask: How bad is the pain?  (e.g., Scale 1-10; mild, moderate, or severe)     denies 7. ORAL INTAKE: If vomiting, Have you been able to drink liquids? How much liquids have you had in the past 24 hours?     na 8. HYDRATION: Any signs of dehydration? (e.g., dry mouth [not just dry lips], too weak to stand, dizziness, new weight loss) When did you last  urinate?     Denies, states could drink more water 9. EXPOSURE: Have you traveled to a foreign country recently? Have you been exposed to anyone with diarrhea? Could you have eaten any food that was spoiled?     no 10. ANTIBIOTIC USE: Are you taking antibiotics now or have you taken antibiotics in the past 2 months?       Yes, for UTI 11. OTHER SYMPTOMS: Do you have any other symptoms? (e.g., fever, blood in stool)       no 12. PREGNANCY: Is there any chance you are pregnant? When was your last menstrual period?       na  Protocols used: [facility]

## 2023-10-27 NOTE — Patient Instructions (Signed)
 Bring urine sample next Monday to office for testing.

## 2023-10-27 NOTE — Progress Notes (Signed)
 [facility] [address] [place] [address] Dept: [phone] Dept Fax: [phone]  Acute Care Office Visit  Subjective:   April Chung 01/20/1946 10/27/2023  Chief Complaint  Patient presents with   Diarrhea    Since antibotic for a week.[place] sleeping a lot  bad UTI     HPI:  April Chung is an 78 year old female who presents with diarrhea. She is accompanied by her spouse, who provides information due to her cognitive impairment.  She has been experiencing diarrhea since starting cefdinir  300 mg BID for a UTI diagnosed on October 19, 2023. The diarrhea began shortly after starting the antibiotic and has been described as 'explosive,' occurring approximately twice per day. Last night, the diarrhea was severe enough that she could not reach the restroom in time. However, she has not experienced any diarrhea today, as she has not taken the antibiotic since last night. Attempts to manage the diarrhea with Imodium were unsuccessful. No blood in her stool.  She was previously on amoxicillin  for a suspected UTI in August, but the urine culture showed no significant bacterial growth, leading to discontinuation of the antibiotic. On October 19, 2023, a urine culture confirmed a UTI positive for Klebsiella, and she was prescribed cefdinir .  Her spouse reports that she has cognitive impairment, which worsens with UTI symptoms, causing her to wake up at night and become disoriented. She has been taking hydroxyzine , which has helped her sleep through the night, although she occasionally wakes up disoriented.  No fever or changes in appetite. She resides in an independent living facility and maintains a regular diet.       The following portions of the patient's history were reviewed and updated as appropriate: past medical history, past surgical history, family history, social history, allergies, medications, and problem  list.   Patient Active Problem List   Diagnosis Date Noted   Weight loss, unintentional 12/01/2022   Moderate dementia with anxiety, unspecified dementia type (HCC) 08/29/2022   Orthostatic hypotension 08/29/2022   Aortic atherosclerosis 05/07/2021   Iron deficiency anemia 05/03/2020   Seizure disorder (HCC) 02/24/2019   Carpal tunnel syndrome, bilateral 02/24/2019   Hypercholesterolemia 02/24/2019   Recurrent major depressive disorder 06/08/2018   Amnestic MCI (mild cognitive impairment with memory loss) 06/14/2014   Urinary incontinence in female 11/09/2012   Osteopenia 11/09/2012   Vitamin D  deficiency 11/09/2012   ADD (attention deficit disorder) 02/24/2012   Hypertension    Memory disturbance 12/08/2011   Gastroesophageal reflux disease 05/28/2011   Glaucoma 05/28/2011   Past Medical History:  Diagnosis Date   Abnormal EEG 01/14/2012   started on Keppra    Cataract    Depression 6 yrs ago   Dermatomyositis (HCC)    remission on pred until 2008, rheum at [facility]   GERD (gastroesophageal reflux disease)    agravated with Fosamax   Glaucoma    Hypertension 2013   Memory loss    PMB (postmenopausal bleeding) 08/04/2003   Seizures (HCC)    Status post dilation of esophageal narrowing    Urinary incontinence, mixed 07/01/2011   Fitted for 2.75  Ring Pessary with support   Vitamin D  deficiency disease    Past Surgical History:  Procedure Laterality Date   COLONOSCOPY  09/25/2004   Dr. Kristie   COLONOSCOPY  02/03/2009   ENDOMETRIAL BIOPSY  08/24/2003   weak proliferative endo, [facility] neg.   ESOPHAGOGASTRODUODENOSCOPY ENDOSCOPY  04/29/2004  anemia without cause   EYE SURGERY     NO PAST SURGERIES     Family History  Problem Relation Age of Onset   Dementia Father        died age 34, otherwise healthy   Hypertension Mother    Heart disease Maternal Grandfather    Heart disease Maternal Grandmother    Seizures Neg Hx     Current Outpatient Medications:     aspirin 81 MG tablet, Take 81 mg by mouth daily., Disp: , Rfl:    brimonidine (ALPHAGAN) 0.15 % ophthalmic solution, 1 drop 2 (two) times daily., Disp: , Rfl:    citalopram  (CELEXA ) 20 MG tablet, Take 1 tablet (20 mg total) by mouth daily., Disp: 90 tablet, Rfl: 1   COMIRNATY syringe, , Disp: , Rfl:    levETIRAcetam  (KEPPRA ) 250 MG tablet, Take 1 tablet (250 mg total) by mouth 2 (two) times daily., Disp: 180 tablet, Rfl: 4   losartan  (COZAAR ) 50 MG tablet, Take 1 tablet (50 mg total) by mouth daily., Disp: 90 tablet, Rfl: 3   memantine  (NAMENDA ) 10 MG tablet, Take 1 tablet (10 mg total) by mouth 2 (two) times daily., Disp: 180 tablet, Rfl: 3   Multiple Vitamins-Calcium  (ONE-A-DAY WOMENS PO), Take by mouth daily., Disp: , Rfl:    pravastatin  (PRAVACHOL ) 20 MG tablet, TAKE 1 TABLET BY MOUTH AT BEDTIME, Disp: 90 tablet, Rfl: 3   XELPROS 0.005 % EMUL, Apply 1 drop to eye daily., Disp: , Rfl:    FLUAD 0.5 ML injection, , Disp: , Rfl:    hydrOXYzine  (ATARAX ) 25 MG tablet, TAKE 1/2 TABLET BY MOUTH 3 TIMES DAILY AS NEEDED FOR ANXIETY, Disp: 30 tablet, Rfl: 0 Allergies  Allergen Reactions   Aricept [Donepezil Hcl] Other (See Comments)    Low heart rate, do not take per cardiologist   Plaquenil [Hydroxychloroquine Sulfate]    Hydroxychloroquine Rash and Other (See Comments)     ROS: A complete ROS was performed with pertinent positives/negatives noted in the HPI. The remainder of the ROS are negative.    Objective:   Today's Vitals   10/27/23 1311  BP: 110/62  Pulse: 63  Temp: 97.8 F (36.6 C)  TempSrc: Temporal  SpO2: 97%  Weight: 105 lb 9.6 oz (47.9 kg)  Height: 5' (1.524 m)    GENERAL: Well-appearing, in NAD. Well nourished.  SKIN: Pink, warm and dry. No rash, lesion, ulceration, or ecchymoses.  NECK: Trachea midline. Full ROM w/o pain or tenderness. No lymphadenopathy.  RESPIRATORY: Chest wall symmetrical. Respirations even and non-labored. Breath sounds clear to auscultation  bilaterally.  CARDIAC: S1, S2 present, regular rate and rhythm. Peripheral pulses 2+ bilaterally.  GI: Abdomen soft, non-tender. Normoactive bowel sounds.  EXTREMITIES: Without clubbing, cyanosis, or edema.  NEUROLOGIC: No motor or sensory deficits. Steady, even gait.  PSYCH/MENTAL STATUS: Alert, oriented to person (patient baseline). Cooperative, appropriate mood and affect.    No results found for any visits on 10/27/23.    Assessment & Plan:  Assessment and Plan    Diarrhea Diarrhea likely due to cefdinir  use. Symptoms not consistent with severe C. diff infection. - Discontinue cefdinir . - Provide stool sample kit for future testing if diarrhea persists.  Urinary tract infection UTI previously treated with cefdinir . Patient did complete 7 days on ABX.  - Discontinue cefdinir . - Provide urine sample kit for collection next week to assess for persistent UTI. - Monitor for signs of UTI recurrence, such as fever, abdominal pain, or changes in  behavior.     Orders Placed This Encounter  Procedures   Urinalysis w microscopic + reflex cultur    Standing Status:   Future    Expected Date:   11/03/2023    Expiration Date:   10/26/2024   Lab Orders         Urinalysis w microscopic + reflex cultur     No images are attached to the encounter or orders placed in the encounter.  Return if symptoms worsen or fail to improve.   Rosina Senters, FNP

## 2023-10-27 NOTE — Telephone Encounter (Signed)
Please see the MyChart message reply(ies) for my assessment and plan.  The patient gave consent for this Medical Advice Message and is aware that it may result in a bill to their insurance company as well as the possibility that this may result in a co-payment or deductible. They are an established patient, but are not seeking medical advice exclusively about a problem treated during an in person or video visit in the last 7 days. I did not recommend an in person or video visit within 7 days of my reply.  I spent a total of 15 minutes cumulative time within 7 days through [place], NP

## 2023-10-30 ENCOUNTER — Ambulatory Visit: Payer: Medicare Other

## 2023-10-30 DIAGNOSIS — Z0000 Encounter for general adult medical examination without abnormal findings: Secondary | ICD-10-CM | POA: Diagnosis not present

## 2023-10-30 NOTE — Patient Instructions (Signed)
 [name],  Thank you for taking the time for your Medicare Wellness Visit. I appreciate your continued commitment to your health goals. Please review the care plan we discussed, and feel free to reach out if I can assist you further.  Medicare recommends these wellness visits once per year to help you and your care team stay ahead of potential health issues. These visits are designed to focus on prevention, allowing your provider to concentrate on managing your acute and chronic conditions during your regular appointments.  Please note that Annual Wellness Visits do not include a physical exam. Some assessments may be limited, especially if the visit was conducted virtually. If needed, we may recommend a separate in-person follow-up with your provider.  Ongoing Care Seeing your primary care provider every 3 to 6 months helps us  monitor your health and provide consistent, personalized care.   Referrals If a referral was made during today's visit and you haven't received any updates within two weeks, please contact the referred provider directly to check on the status.  Recommended Screenings:  Health Maintenance  Topic Date Due   Zoster (Shingles) Vaccine (2 of 2) 01/16/2021   Flu Shot  09/04/2023   COVID-19 Vaccine (7 - 2025-26 season) 10/05/2023   Medicare Annual Wellness Visit  10/29/2024   Pneumococcal Vaccine for age over 72  Completed   DEXA scan (bone density measurement)  Completed   HPV Vaccine  Aged Out   Meningitis B Vaccine  Aged Out   DTaP/Tdap/Td vaccine  Discontinued   Colon Cancer Screening  Discontinued   Hepatitis C Screening  Discontinued       10/30/2023    3:39 PM  Advanced Directives  Does Patient Have a Medical Advance Directive? Yes  Type of Estate agent of [place];Living will  Copy of Healthcare Power of Attorney in Chart? No - copy requested   Advance Care Planning is important because it: Ensures you receive medical care that  aligns with your values, goals, and preferences. Provides guidance to your family and loved ones, reducing the emotional burden of decision-making during critical moments.  Vision: Annual vision screenings are recommended for early detection of glaucoma, cataracts, and diabetic retinopathy. These exams can also reveal signs of chronic conditions such as diabetes and high blood pressure.  Dental: Annual dental screenings help detect early signs of oral cancer, gum disease, and other conditions linked to overall health, including heart disease and diabetes.  Please see the attached documents for additional preventive care recommendations.

## 2023-10-30 NOTE — Progress Notes (Signed)
 Subjective:   April Chung is a 78 y.o. who presents for a Medicare Wellness preventive visit.  As a reminder, Annual Wellness Visits don't include a physical exam, and some assessments may be limited, especially if this visit is performed virtually. We may recommend an in-person follow-up visit with your provider if needed.  Visit Complete: Virtual I connected with  April Chung on 10/30/23 by a video and audio enabled telemedicine application and verified that I am speaking with the correct person using two identifiers.  Patient Location: Home  Provider Location: Office/Clinic  I discussed the limitations of evaluation and management by telemedicine. The patient expressed understanding and agreed to proceed.  Vital Signs: Because this visit was a virtual/telehealth visit, some criteria may be missing or patient reported. Any vitals not documented were not able to be obtained and vitals that have been documented are patient reported.    Persons Participating in Visit: Patient assisted by spouse.  AWV Questionnaire: Yes: Patient Medicare AWV questionnaire was completed by the patient on 10/27/2023; I have confirmed that all information answered by patient is correct and no changes since this date.  Cardiac Risk Factors include: advanced age (>81men, >70 women);hypertension     Objective:    Today's Vitals   There is no height or weight on file to calculate BMI.     10/30/2023    3:39 PM 10/27/2022    3:03 PM 08/25/2019    2:28 PM 03/25/2017    9:12 AM 07/12/2016    6:47 PM 08/02/2014   11:38 AM  Advanced Directives  Does Patient Have a Medical Advance Directive? Yes Yes No;Yes Yes  No  Yes   Type of Estate agent of [place];Living will Healthcare Power of [place];Living will Healthcare Power of [place];Living will Healthcare Power of [place];Living will  Healthcare Power of [place];Living will   Copy of Healthcare Power of Attorney in Chart? No - copy  requested No - copy requested No - copy requested No - copy requested   No - copy requested   Would patient like information on creating a medical advance directive?     No - Patient declined       Data saved with a previous flowsheet row definition    Current Medications (verified) Outpatient Encounter Medications as of 10/30/2023  Medication Sig   aspirin 81 MG tablet Take 81 mg by mouth daily.   brimonidine (ALPHAGAN) 0.15 % ophthalmic solution 1 drop 2 (two) times daily.   citalopram  (CELEXA ) 20 MG tablet Take 1 tablet (20 mg total) by mouth daily.   hydrOXYzine  (ATARAX ) 25 MG tablet TAKE 1/2 TABLET BY MOUTH 3 TIMES DAILY AS NEEDED FOR ANXIETY   levETIRAcetam  (KEPPRA ) 250 MG tablet Take 1 tablet (250 mg total) by mouth 2 (two) times daily.   losartan  (COZAAR ) 50 MG tablet Take 1 tablet (50 mg total) by mouth daily.   memantine  (NAMENDA ) 10 MG tablet Take 1 tablet (10 mg total) by mouth 2 (two) times daily.   Multiple Vitamins-Calcium  (ONE-A-DAY WOMENS PO) Take by mouth daily.   pravastatin  (PRAVACHOL ) 20 MG tablet TAKE 1 TABLET BY MOUTH AT BEDTIME   XELPROS 0.005 % EMUL Apply 1 drop to eye daily.   COMIRNATY syringe  (Patient not taking: Reported on 10/30/2023)   FLUAD 0.5 ML injection  (Patient not taking: Reported on 10/30/2023)   No facility-administered encounter medications on file as of 10/30/2023.    Allergies (verified) Aricept [donepezil hcl], Plaquenil [hydroxychloroquine sulfate],  and Hydroxychloroquine   History: Past Medical History:  Diagnosis Date   Abnormal EEG 01/14/2012   started on Keppra    Cataract    Depression 6 yrs ago   Dermatomyositis (HCC)    remission on pred until 2008, rheum at [facility]   GERD (gastroesophageal reflux disease)    agravated with Fosamax   Glaucoma    Hypertension 2013   Memory loss    PMB (postmenopausal bleeding) 08/04/2003   Seizures (HCC)    Status post dilation of esophageal narrowing    Urinary incontinence, mixed  07/01/2011   Fitted for 2.75  Ring Pessary with support   Vitamin D  deficiency disease    Past Surgical History:  Procedure Laterality Date   COLONOSCOPY  09/25/2004   Dr. Kristie   COLONOSCOPY  02/03/2009   ENDOMETRIAL BIOPSY  08/24/2003   weak proliferative endo, [facility] neg.   ESOPHAGOGASTRODUODENOSCOPY ENDOSCOPY  04/29/2004   anemia without cause   EYE SURGERY     NO PAST SURGERIES     Family History  Problem Relation Age of Onset   Dementia Father        died age 33, otherwise healthy   Hypertension Mother    Heart disease Maternal Grandfather    Heart disease Maternal Grandmother    Seizures Neg Hx    Social History   Socioeconomic History   Marital status: Married    Spouse name: Lynwood   Number of children: 1   Years of education: hs   Highest education level: Associate degree: occupational, Scientist, product/process development, or vocational program  Occupational History   Occupation: housewife  Tobacco Use   Smoking status: Former    Current packs/day: 0.00    Average packs/day: 1 pack/day for 15.0 years (15.0 ttl pk-yrs)    Types: Cigarettes    Start date: 02/03/1961    Quit date: 02/04/1976    Years since quitting: 47.7   Smokeless tobacco: Never  Vaping Use   Vaping status: Never Used  Substance and Sexual Activity   Alcohol use: Not Currently    Alcohol/week: 7.0 standard drinks of alcohol    Types: 7 Glasses of wine per week    Comment: not actively   Drug use: No   Sexual activity: Never    Partners: Male    Birth control/protection: Post-menopausal  Other Topics Concern   Not on file  Social History Narrative   Married, lives with spouse Sydnee).   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   Patient has one child.   Patient is right-handed.   Patient drinks 2-3 cups of coffee daily and maybe half cup at night.               Social Drivers of Corporate investment banker Strain: Low Risk  (10/30/2023)    Overall Financial Resource Strain (CARDIA)    Difficulty of Paying Living Expenses: Not hard at all  Food Insecurity: No Food Insecurity (10/30/2023)   Hunger Vital Sign    Worried About Running Out of Food in the Last Year: Never true    Ran Out of Food in the Last Year: Never true  Transportation Needs: No Transportation Needs (10/30/2023)   PRAPARE - Administrator, Civil Service (Medical): No    Lack of Transportation (Non-Medical): No  Physical Activity: Inactive (10/30/2023)   Exercise Vital Sign    Days of Exercise per Week: 0 days  Minutes of Exercise per Session: 0 min  Stress: No Stress Concern Present (10/30/2023)   [employer] of Occupational Health - Occupational Stress Questionnaire    Feeling of Stress: Not at all  Social Connections: Moderately Isolated (10/30/2023)   Social Connection and Isolation Panel    Frequency of Communication with Friends and Family: More than three times a week    Frequency of Social Gatherings with Friends and Family: Once a week    Attends Religious Services: Never    Database administrator or Organizations: No    Attends Engineer, structural: Never    Marital Status: Married    Tobacco Counseling Counseling given: Not Answered    Clinical Intake:  Pre-visit preparation completed: Yes  Pain : No/denies pain     Nutritional Risks: Nausea/ vomitting/ diarrhea (diarrhea has resolved) Diabetes: No  Lab Results  Component Value Date   HGBA1C 5.3 03/03/2023     How often do you need to have someone help you when you read instructions, pamphlets, or other written materials from your doctor or pharmacy?: 5 - Always  Interpreter Needed?: No  Information entered by :: NAllen LPN   Activities of Daily Living     10/27/2023    4:42 PM  In your present state of health, do you have any difficulty performing the following activities:  Hearing? 0  Vision? 0  Difficulty concentrating or making decisions? 1   Walking or climbing stairs? 1  Dressing or bathing? 1  Doing errands, shopping? 1  Preparing Food and eating ? Y  Using the Toilet? Y  In the past six months, have you accidently leaked urine? N  Do you have problems with loss of bowel control? Y  Managing your Medications? Y  Managing your Finances? Y  Housekeeping or managing your Housekeeping? Y    Patient Care Team: Nche, Roselie Rockford, NP as PCP - General (Internal Medicine) Romine, Montie SQUIBB, MD (Obstetrics and Gynecology) [place], Heather, [place] (Optometry) Abran Norleen SAILOR, MD (Gastroenterology) Jenel Carlin POUR, MD (Inactive) (Neurology)  I have updated your Care Teams any recent Medical Services you may have received from other providers in the past year.     Assessment:   This is a routine wellness examination for April Chung.  Hearing/Vision screen Hearing Screening - Comments:: Denies hearing issues Vision Screening - Comments:: Regular eye exams, [facility]   Goals Addressed             This Visit's Progress    Patient Stated       10/30/2023, denies goals       Depression Screen     10/30/2023    3:41 PM 09/11/2023   10:48 AM 03/03/2023    1:24 PM 12/01/2022    1:28 PM 10/27/2022    3:04 PM 08/29/2022    1:44 PM 08/29/2021    2:04 PM  PHQ 2/9 Scores  PHQ - 2 Score 0 0 0 0 0 0 0  PHQ- 9 Score 3 0 0   0     Fall Risk     10/27/2023    4:42 PM 09/11/2023   10:49 AM 03/03/2023    1:06 PM 12/01/2022    1:28 PM 10/27/2022    3:03 PM  Fall Risk   Falls in the past year? 0 0 0 0 0  Number falls in past yr: 0 0 0 0 0  Injury with Fall? 0 0 0 0 0  Risk for fall  due to : Medication side effect  Other (Comment) No Fall Risks Medication side effect  Follow up Falls prevention discussed;Falls evaluation completed  Falls evaluation completed Falls evaluation completed Falls prevention discussed;Falls evaluation completed    MEDICARE RISK AT HOME:  Medicare Risk at Home Any stairs in or around the home?:  (Patient-Rptd) No If so, are there any without handrails?: (Patient-Rptd) No Home free of loose throw rugs in walkways, pet beds, electrical cords, etc?: (Patient-Rptd) Yes Adequate lighting in your home to reduce risk of falls?: (Patient-Rptd) Yes Life alert?: (Patient-Rptd) No Use of a cane, walker or w/c?: (Patient-Rptd) Yes Grab bars in the bathroom?: (Patient-Rptd) Yes Shower chair or bench in shower?: (Patient-Rptd) Yes Elevated toilet seat or a handicapped toilet?: (Patient-Rptd) Yes  TIMED UP AND GO:  Was the test performed?  No  Cognitive Function: Impaired: Patient has current diagnosis of cognitive impairment.    12/31/2021    2:00 PM 08/29/2021    2:18 PM 11/23/2018    2:32 PM 02/11/2018    1:28 PM 03/25/2017    9:33 AM  MMSE - Mini Mental State Exam  Not completed: Unable to complete Unable to complete     Orientation to time   2 2 5    Orientation to Place   3 3 5    Registration   3 3 3    Attention/ Calculation   2 4 5    Recall   1 2 0   Language- name 2 objects   2 2 2    Language- repeat   1 0 1  Language- follow 3 step command   3 3 3    Language- read & follow direction   1 1 1    Write a sentence   1 1 1    Copy design   1 0 1   Copy design-comments   named 5 animals    Total score   20 21 27       Data saved with a previous flowsheet row definition        Immunizations Immunization History  Administered Date(s) Administered   Fluad Quad(high Dose 65+) 11/03/2018, 11/06/2020   Fluad Trivalent(High Dose 65+) 10/27/2022   INFLUENZA, HIGH DOSE SEASONAL PF 12/16/2014, 11/08/2016   Influenza Split 11/04/2010, 12/17/2015, 11/11/2016   Influenza-Unspecified 11/17/2013, 12/17/2015, 11/11/2016, 12/04/2017, 11/06/2020, 10/27/2022   Moderna Covid-19 Fall Seasonal Vaccine 16yrs & older 11/07/2021   Moderna Covid-19 Vaccine Bivalent Booster 86yrs & up 11/21/2020, 11/04/2021   Moderna Sars-Covid-2 Vaccination 02/16/2019, 03/07/2019, 03/28/2019   PFIZER Comirnaty(Gray  Top)Covid-19 Tri-Sucrose Vaccine 10/27/2022   PPD Test 06/25/2021   Pneumococcal Conjugate-13 12/13/2013, 05/19/2016   Pneumococcal Polysaccharide-23 11/12/2011   Tdap 08/24/2012   Zoster Recombinant(Shingrix) 11/21/2020    Screening Tests Health Maintenance  Topic Date Due   Zoster Vaccines- Shingrix (2 of 2) 01/16/2021   Influenza Vaccine  09/04/2023   COVID-19 Vaccine (7 - 2025-26 season) 10/05/2023   Medicare Annual Wellness (AWV)  10/29/2024   Pneumococcal Vaccine: 50+ Years  Completed   DEXA SCAN  Completed   HPV VACCINES  Aged Out   Meningococcal B Vaccine  Aged Out   DTaP/Tdap/Td  Discontinued   Colonoscopy  Discontinued   Hepatitis C Screening  Discontinued    Health Maintenance Items Addressed: Will get covid and flu vaccine later in season. Going to check on second dose of shingles vaccine.  Additional Screening:  Vision Screening: Recommended annual ophthalmology exams for early detection of glaucoma and other disorders of the eye. Is the patient  up to date with their annual eye exam?  Yes  Who is the provider or what is the name of the office in which the patient attends annual eye exams? [facility]  Dental Screening: Recommended annual dental exams for proper oral hygiene  Community Resource Referral / Chronic Care Management: CRR required this visit?  No   CCM required this visit?  No   Plan:    I have personally reviewed and noted the following in the patient's chart:   Medical and social history Use of alcohol, tobacco or illicit drugs  Current medications and supplements including opioid prescriptions. Patient is not currently taking opioid prescriptions. Functional ability and status Nutritional status Physical activity Advanced directives List of other physicians Hospitalizations, surgeries, and ER visits in previous 12 months Vitals Screenings to include cognitive, depression, and falls Referrals and appointments  In addition, I have  reviewed and discussed with patient certain preventive protocols, quality metrics, and best practice recommendations. A written personalized care plan for preventive services as well as general preventive health recommendations were provided to patient.   Ardella FORBES Dawn, LPN   [id number]   After Visit Summary: (MyChart) Due to this being a telephonic visit, the after visit summary with patients personalized plan was offered to patient via MyChart   Notes: Nothing significant to report at this time.

## 2023-11-02 DIAGNOSIS — N390 Urinary tract infection, site not specified: Secondary | ICD-10-CM | POA: Diagnosis not present

## 2023-11-03 ENCOUNTER — Ambulatory Visit: Payer: Self-pay | Admitting: Internal Medicine

## 2023-11-03 DIAGNOSIS — H401132 Primary open-angle glaucoma, bilateral, moderate stage: Secondary | ICD-10-CM | POA: Diagnosis not present

## 2023-11-04 LAB — URINE CULTURE
MICRO NUMBER:: 17034770
SPECIMEN QUALITY:: ADEQUATE

## 2023-11-04 LAB — URINALYSIS W MICROSCOPIC + REFLEX CULTURE
Bilirubin Urine: NEGATIVE
Glucose, UA: NEGATIVE
Hgb urine dipstick: NEGATIVE
Ketones, ur: NEGATIVE
Nitrites, Initial: NEGATIVE
Protein, ur: NEGATIVE
Specific Gravity, Urine: 1.019 (ref 1.001–1.035)
pH: 5.5 (ref 5.0–8.0)

## 2023-11-04 LAB — CULTURE INDICATED

## 2023-11-05 ENCOUNTER — Encounter: Payer: Self-pay | Admitting: Nurse Practitioner

## 2023-11-05 DIAGNOSIS — F03B4 Unspecified dementia, moderate, with anxiety: Secondary | ICD-10-CM

## 2023-11-05 DIAGNOSIS — F5105 Insomnia due to other mental disorder: Secondary | ICD-10-CM

## 2023-11-06 ENCOUNTER — Encounter: Payer: Self-pay | Admitting: Nurse Practitioner

## 2023-11-06 MED ORDER — BELSOMRA 5 MG PO TABS: 5.0000 mg | ORAL_TABLET | Freq: Every evening | ORAL | 2 refills | Status: DC | PRN

## 2023-11-09 ENCOUNTER — Telehealth: Payer: Self-pay

## 2023-11-09 ENCOUNTER — Other Ambulatory Visit (HOSPITAL_COMMUNITY): Payer: Self-pay

## 2023-11-09 NOTE — Telephone Encounter (Signed)
PA request has been Submitted.

## 2023-11-09 NOTE — Telephone Encounter (Signed)
 Called the number listed for patient and her husband Avaya Mcjunkins who is listed on the DPR on file was informed of the information that approved the medication and that he may call or go to the pharmacy to pick up medication. He thanked me for calling and all questions (if any) were answered.

## 2023-11-09 NOTE — Telephone Encounter (Unsigned)
 Copied from CRM #[id number]. Topic: Clinical - Medication Prior Auth >> Nov 09, 2023 10:11 AM [place] E wrote: Reason for CRM: [place] with [employer] called to state that patient's Suvorexant (BELSOMRA) 5 MG TABS has been approved until 11/08/2024. [place] will be faxing over this information as well to confirm.

## 2023-11-09 NOTE — Telephone Encounter (Signed)
 Per MyChart message from patient PA is needed for Suvorexant (BELSOMRA) 5 MG TABS

## 2023-11-10 ENCOUNTER — Other Ambulatory Visit (HOSPITAL_COMMUNITY): Payer: Self-pay

## 2023-11-13 ENCOUNTER — Other Ambulatory Visit: Payer: Self-pay | Admitting: Nurse Practitioner

## 2023-11-13 DIAGNOSIS — I10 Essential (primary) hypertension: Secondary | ICD-10-CM

## 2023-11-13 DIAGNOSIS — E781 Pure hyperglyceridemia: Secondary | ICD-10-CM

## 2023-11-13 DIAGNOSIS — I700 Atherosclerosis of aorta: Secondary | ICD-10-CM

## 2023-11-16 ENCOUNTER — Other Ambulatory Visit: Payer: Self-pay | Admitting: *Deleted

## 2023-11-16 NOTE — Telephone Encounter (Signed)
 Last seen on 10/06/23 Follow up scheduled on 12/27/24    Dispensed Days Supply Quantity Provider Pharmacy  memantine  10 mg tablet 11/05/2023 90 180 each Lomax, Amy, NP [facility]      Rx denied, too soon to refill, 90 day supply approved on 11/05/23

## 2023-12-02 ENCOUNTER — Telehealth: Payer: Self-pay

## 2023-12-02 ENCOUNTER — Ambulatory Visit: Admitting: Internal Medicine

## 2023-12-02 VITALS — BP 130/76 | HR 69 | Temp 98.0°F | Ht 60.0 in | Wt 106.0 lb

## 2023-12-02 DIAGNOSIS — R3989 Other symptoms and signs involving the genitourinary system: Secondary | ICD-10-CM | POA: Diagnosis not present

## 2023-12-02 LAB — POCT URINALYSIS DIPSTICK
Bilirubin, UA: NEGATIVE
Blood, UA: POSITIVE
Glucose, UA: NEGATIVE
Ketones, UA: NEGATIVE
Nitrite, UA: POSITIVE
Protein, UA: POSITIVE — AB
Spec Grav, UA: 1.02 (ref 1.010–1.025)
Urobilinogen, UA: 0.2 U/dL
pH, UA: 6 (ref 5.0–8.0)

## 2023-12-02 MED ORDER — SULFAMETHOXAZOLE-TRIMETHOPRIM 800-160 MG PO TABS: 1.0000 | ORAL_TABLET | Freq: Two times a day (BID) | ORAL | 0 refills | Status: DC

## 2023-12-02 NOTE — Telephone Encounter (Signed)
 Please see message below.   Thanks,  Nataliah Hatlestad CCMA

## 2023-12-02 NOTE — Telephone Encounter (Signed)
 Copied from CRM #[id number]. Topic: Clinical - Medical Advice >> Dec 02, 2023 10:27 AM [place] DEL wrote: Reason for CRM: Patients husband called in stating that his wife has been acting strange and not herself lately,and usually when this happens she has a UTI. He would like to know if he could come by the office to get a hat and specimen cup to collect a urine sample from her. He stated that this is what [place] usually allows him to do.He  stated she has had a few UTI'S in the last few months. Please advise.

## 2023-12-02 NOTE — Progress Notes (Signed)
 Chief Complaint  Patient presents with   Urinary Tract Infection    Pt is here with spouse, Amia Rynders. Reports pt has some confusion. Started mid night. Spouse reports when pt is confuse who usually have UTI . Spouse reports pt had reaction to cefdinir  when Rx for previous UTI in September    HPI: April Chung 78 y.o. come in for SDA  poss uti sx  here with spouse  See above  Hx of frequent utis  .  Having confusion last night but denies fever pain dysuria today ( memory  deficit)   Cefinidir  caused diarrhea.last rx in September . ? No other allergy to antibiotic  No abd pain co  ROS: See pertinent positives and negatives per HPI.  Past Medical History:  Diagnosis Date   Abnormal EEG 01/14/2012   started on Keppra    Cataract    Depression 6 yrs ago   Dermatomyositis (HCC)    remission on pred until 2008, rheum at [facility]   GERD (gastroesophageal reflux disease)    agravated with Fosamax   Glaucoma    Hypertension 2013   Memory loss    PMB (postmenopausal bleeding) 08/04/2003   Seizures (HCC)    Status post dilation of esophageal narrowing    Urinary incontinence, mixed 07/01/2011   Fitted for 2.75  Ring Pessary with support   Vitamin D  deficiency disease     Family History  Problem Relation Age of Onset   Dementia Father        died age 52, otherwise healthy   Hypertension Mother    Heart disease Maternal Grandfather    Heart disease Maternal Grandmother    Seizures Neg Hx     Social History   Socioeconomic History   Marital status: Married    Spouse name: Lynwood   Number of children: 1   Years of education: hs   Highest education level: Associate degree: occupational, scientist, product/process development, or vocational program  Occupational History   Occupation: housewife  Tobacco Use   Smoking status: Former    Current packs/day: 0.00    Average packs/day: 1 pack/day for 15.0 years (15.0 ttl pk-yrs)    Types: Cigarettes    Start date: 02/03/1961    Quit date: 02/04/1976     Years since quitting: 47.8   Smokeless tobacco: Never  Vaping Use   Vaping status: Never Used  Substance and Sexual Activity   Alcohol use: Not Currently    Alcohol/week: 7.0 standard drinks of alcohol    Types: 7 Glasses of wine per week    Comment: not actively   Drug use: No   Sexual activity: Never    Partners: Male    Birth control/protection: Post-menopausal  Other Topics Concern   Not on file  Social History Narrative   Married, lives with spouse Sydnee).   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   Patient has one child.   Patient is right-handed.   Patient drinks 2-3 cups of coffee daily and maybe half cup at night.               Social Drivers of Corporate Investment Banker Strain: Low Risk  (12/02/2023)   Overall Financial Resource Strain (CARDIA)    Difficulty of Paying Living Expenses: Not hard at all  Food Insecurity: No Food Insecurity (12/02/2023)   Hunger Vital Sign    Worried About Running Out of  Food in the Last Year: Never true    Ran Out of Food in the Last Year: Never true  Transportation Needs: No Transportation Needs (12/02/2023)   PRAPARE - Administrator, Civil Service (Medical): No    Lack of Transportation (Non-Medical): No  Physical Activity: Inactive (12/02/2023)   Exercise Vital Sign    Days of Exercise per Week: 0 days    Minutes of Exercise per Session: Not on file  Stress: Stress Concern Present (12/02/2023)   [employer] of Occupational Health - Occupational Stress Questionnaire    Feeling of Stress: Rather much  Social Connections: Moderately Integrated (12/02/2023)   Social Connection and Isolation Panel    Frequency of Communication with Friends and Family: Three times a week    Frequency of Social Gatherings with Friends and Family: Once a week    Attends Religious Services: 1 to 4 times per year    Active Member of [employer] or Organizations: No     Attends Engineer, Structural: Not on file    Marital Status: Married  Recent Concern: Social Connections - Moderately Isolated (10/30/2023)   Social Connection and Isolation Panel    Frequency of Communication with Friends and Family: More than three times a week    Frequency of Social Gatherings with Friends and Family: Once a week    Attends Religious Services: Never    Database Administrator or Organizations: No    Attends Engineer, Structural: Never    Marital Status: Married    Outpatient Medications Prior to Visit  Medication Sig Dispense Refill   levETIRAcetam  (KEPPRA ) 250 MG tablet Take 1 tablet (250 mg total) by mouth 2 (two) times daily. 180 tablet 4   losartan  (COZAAR ) 50 MG tablet TAKE 1 TABLET BY MOUTH ONCE DAILY 90 tablet 3   memantine  (NAMENDA ) 10 MG tablet Take 1 tablet (10 mg total) by mouth 2 (two) times daily. 180 tablet 3   Multiple Vitamins-Calcium  (ONE-A-DAY WOMENS PO) Take by mouth daily.     pravastatin  (PRAVACHOL ) 20 MG tablet TAKE 1 TABLET BY MOUTH AT BEDTIME 90 tablet 3   Suvorexant (BELSOMRA) 5 MG TABS Take 1 tablet (5 mg total) by mouth at bedtime as needed. 30 tablet 2   XELPROS 0.005 % EMUL Apply 1 drop to eye daily.     aspirin 81 MG tablet Take 81 mg by mouth daily.     brimonidine (ALPHAGAN) 0.15 % ophthalmic solution 1 drop 2 (two) times daily.     citalopram  (CELEXA ) 20 MG tablet Take 1 tablet (20 mg total) by mouth daily. 90 tablet 1   COMIRNATY syringe  (Patient not taking: Reported on 12/02/2023)     FLUAD 0.5 ML injection  (Patient not taking: Reported on 12/02/2023)     hydrOXYzine  (ATARAX ) 25 MG tablet TAKE 1/2 TABLET BY MOUTH 3 TIMES DAILY AS NEEDED FOR ANXIETY (Patient not taking: Reported on 12/02/2023) 30 tablet 0   No facility-administered medications prior to visit.     EXAM:  BP 130/76 (BP Location: Left Arm, Patient Position: Sitting, Cuff Size: Normal)   Pulse 69   Temp 98 F (36.7 C) (Oral)   Ht 5' (1.524 m)    Wt 106 lb (48.1 kg)   LMP 08/03/1993 (Approximate)   SpO2 95%   BMI 20.70 kg/m   Body mass index is 20.7 kg/m.  GENERAL: vitals reviewed and listed above, alert,  appears well hydrated and in no acute distress cooperative  HEENT: atraumatic, conjunctiva  clear, no obvious abnormalities on inspection of external nose and ears LUNGS: clear to auscultation bilaterally, no wheezes, rales or rhonchi, good air movement CV: HRRR, no clubbing cyanosis onl cap refill  Neg cva tenderness   independent gait but does have a roller walker  MS: moves all extremities without noticeable focal  abnormality PSYCH: pleasant and cooperative hx mostly by  spouse  Lab Results  Component Value Date   WBC 7.8 09/14/2023   HGB 12.0 09/14/2023   HCT 36.5 09/14/2023   PLT 303.0 09/14/2023   GLUCOSE 99 09/14/2023   CHOL 140 08/29/2022   TRIG 76.0 08/29/2022   HDL 52.00 08/29/2022   LDLDIRECT 129.0 02/09/2017   LDLCALC 73 08/29/2022   ALT 24 09/14/2023   AST 25 09/14/2023   NA 138 09/14/2023   K 4.0 09/14/2023   CL 105 09/14/2023   CREATININE 0.56 09/14/2023   BUN 23 09/14/2023   CO2 27 09/14/2023   TSH 1.73 09/14/2023   HGBA1C 5.3 03/03/2023   BP Readings from Last 3 Encounters:  12/02/23 130/76  10/27/23 110/62  10/19/23 110/62   UA pos nitrites leuk and blood  Last org klebsiella oxytocia  r to keflex    given cefdinir   Cma noted  she wipes back to front . ASSESSMENT AND PLAN:  Discussed the following assessment and plan:  Suspected UTI - Plan: POC Urinalysis Dipstick, Urine Culture  -Patient advised to return or notify health care team  if  new concerns arise.  Patient Instructions  Culture pending  Sending in antibiotic septra  for 5 days.   Attention to hygiene wipe front to back   sometimes estrogen cream  on out side of vaginal area to help prevent uti .    Pranika Finks K. Walton Digilio M.D.

## 2023-12-02 NOTE — Patient Instructions (Signed)
 Culture pending  Sending in antibiotic septra  for 5 days.   Attention to hygiene wipe front to back   sometimes estrogen cream  on out side of vaginal area to help prevent uti .

## 2023-12-05 ENCOUNTER — Ambulatory Visit: Payer: Self-pay | Admitting: Family

## 2023-12-05 LAB — URINE CULTURE
MICRO NUMBER:: 17164941
SPECIMEN QUALITY:: ADEQUATE

## 2023-12-06 ENCOUNTER — Encounter: Payer: Self-pay | Admitting: Nurse Practitioner

## 2023-12-06 DIAGNOSIS — N3000 Acute cystitis without hematuria: Secondary | ICD-10-CM

## 2023-12-06 DIAGNOSIS — F03B4 Unspecified dementia, moderate, with anxiety: Secondary | ICD-10-CM

## 2023-12-07 ENCOUNTER — Encounter: Payer: Self-pay | Admitting: Family Medicine

## 2023-12-07 ENCOUNTER — Other Ambulatory Visit: Payer: Self-pay | Admitting: Nurse Practitioner

## 2023-12-07 MED ORDER — HYDROXYZINE HCL 25 MG PO TABS: 25.0000 mg | ORAL_TABLET | Freq: Two times a day (BID) | ORAL | 0 refills | Status: AC | PRN

## 2023-12-08 ENCOUNTER — Encounter: Payer: Self-pay | Admitting: Family Medicine

## 2023-12-08 ENCOUNTER — Other Ambulatory Visit

## 2023-12-08 ENCOUNTER — Encounter: Payer: Self-pay | Admitting: Nurse Practitioner

## 2023-12-08 ENCOUNTER — Other Ambulatory Visit (INDEPENDENT_AMBULATORY_CARE_PROVIDER_SITE_OTHER)

## 2023-12-08 ENCOUNTER — Ambulatory Visit: Admitting: Family Medicine

## 2023-12-08 VITALS — BP 112/78 | HR 88 | Ht 59.0 in | Wt 104.5 lb

## 2023-12-08 DIAGNOSIS — G40909 Epilepsy, unspecified, not intractable, without status epilepticus: Secondary | ICD-10-CM | POA: Diagnosis not present

## 2023-12-08 DIAGNOSIS — N3000 Acute cystitis without hematuria: Secondary | ICD-10-CM

## 2023-12-08 DIAGNOSIS — N390 Urinary tract infection, site not specified: Secondary | ICD-10-CM

## 2023-12-08 DIAGNOSIS — F03B4 Unspecified dementia, moderate, with anxiety: Secondary | ICD-10-CM

## 2023-12-08 MED ORDER — BREXPIPRAZOLE 1 MG PO TABS: ORAL_TABLET | ORAL | 0 refills | Status: DC

## 2023-12-08 MED ORDER — MEMANTINE HCL 10 MG PO TABS: 10.0000 mg | ORAL_TABLET | Freq: Two times a day (BID) | ORAL | 3 refills | Status: AC

## 2023-12-08 NOTE — Progress Notes (Signed)
 Chief Complaint  Patient presents with   Follow-up    Pt in room 1. Husband in room. Here for worsening symptoms.      HISTORY OF PRESENT ILLNESS:  12/08/23 ALL: April Chung returns for follow up for seizures and dementia. She was last seen by Dr Gregg 10/2022 and felt memory was stable and no seizures after resuming lev 250mg  BID.   Since, Mr Broden reports worsening agitation. She is waking up around midnight and asking Mr Yano to take her home. She is not easily redirected and will stay up the rest of the night.  She is having difficulty with performing ADLs. Mr Arca has to remind her to take showers. He is concerned that she may not be performing good hygiene practices. She gets very irritable if he tries to help her. She was recently treated for UTI. Her PCP increased hydroxyzine  to 25mg  BID yesterday. Belsomra tried and worked for about a month but recently stopped.   Mr Cielo denies seizure activity. She is tolerating levetiracetam  250mg  BID.    12/23/2022 ALL:  April Chung returns for follow up for seizures and memory loss. She was seen last 12/2021. We continued levetiracetam  250mg  and memantine  10mg  BID. Since, she continues to do well. Memory is stable. No seizures. Husband denies any know seizure events but was started on lev following concerns of memory loss with abnormal EEG. He has never known her to have an actual seizure event. She has adjusted to living at [facility]. Appetite is good. She has lost 11lbs but unclear why. She is followed closely by PCP. She drinks Ensure daily. She sleeps well. Mr lonie rummell. She does not drive.   12/31/2021 ALL: April Chung returns for follow up for seizures and memory loss. She continues levetiracetam  250mg  BID and memantine  10mg  BID. She is doing well. No significant changes since last year. She and her husband now live in [facility] independent living. She is a little less active outside but continues to stay active around her home. She  is able to perform ADLs. She does not drive. Memory is stable. No seizures.   12/25/2020 ALL: April Chung returns for follow up for seizures and memory loss. She continues levetiracetam  250mg  BID and memantine  10mg  BID. She continues to do well. She is tolerating meds. Memory is stable. No significant changes. She denies falls. Appetite is good. She has lost about 3 pounds over the year but contributes this to eating healthier foods. She continues to perform ADLs independently. She is able to manage her own medications. She walks her dogs every day and tries to walk around in the yard when she can. She enjoys crossword puzzles. She drinks about 1/2 glass of merlot every night.   12/21/2019 ALL:  April Chung is a 78 y.o. female here today for follow up for seizures and memory loss. She continues levetiracetam  250mg  BID and Namenda  10mg  BID. She presents with her husband who aids in history. She feels she is doing well and husband agrees. She continues to be fairly independent. Mr Armas helps with meal preparation and medications. She is able to perform ADL's. She is less active than she used to be but continues to walk daily. No seizure activity.    HISTORY (copied from previous note)  April Chung is a 78 y.o. female here today for follow up of seizure ans memory loss. She continues Keppra  250mg  twice daily. She is also taking Namenda  10mg  twice daily. She was unable to  tolerate Aricept due to low heart rate.  Overall, she feels that she is doing very well.  Her husband is with her today and agrees.  They feel that memory is fairly stable.  She is able to perform ADLs independently.  She does not drive.  She is not as active as she used to be.  Her husband mentions that walking has become much slower and pace.  He states that she seems stable in balance but moves her feet much slower than she used to.  He denies shuffling or stooped posture.  He has considered having her to use a walking stick but is concerned  that she will not use it.  There have been no falls.  No strokelike symptoms.  No seizure activity.   She and her husband deny concerns of elevated blood pressures at home.  They admit that they do not routinely check.  Blood pressure was normal in the office with Megan in January/2020.  Initial blood pressure reading in office today was 182/100.  At completion of visit we recheck blood pressure and it is 151/77.  Her husband feels that normal blood pressure readings are 130s over 70s to 80s.  She is followed by primary care for hypertension.  She is currently taking Cozaar  50 mg daily as well as Norvasc  2.5 mg daily.  She denies headaches, dizziness, chest pain or trouble breathing.   HISTORY: (copied from [name]'s note on 02/11/2018)   April Chung is a 78 year old right handed female who presents today for follow up regarding seizures and memory loss. She is taking Namenda  10mg  twice daily. She had to stop Aricept due to decreased heart rate. Mr Gresham states that he has not been able to tell much of a difference since stopping Aricept with the exception of slower movements. She is tolerating Namenda  well. MMSE today is 21/30. In 03/2017 she scored 27/30. She continues to perform all ADL's independently. Mr Hittle works during the day and she is doing well staying home alone. He does assist with dosing medications. She has become reclusive and typically does not like to go out much per her husband.   She is taking Keppra  250mg  twice daily. She denies seizure like activity. She is tolerating Keppra  well without excessive drowsiness.    HISTORY: (copied from Dr Jenel' note from 02/10/2017)   April Chung is a 78 year old right-handed white female with a history of a mild memory disturbance and history of seizures.  The patient is on Aricept taking 10 mg daily, she is on Namenda  taking 10 mg twice daily.  She tolerates these medications well.  She is on low-dose Keppra , she tolerates this drug also without  drowsiness.  She has not had any recurring seizures since last seen, she does operate a motor vehicle without difficulty.  The patient has not noted any progression of balance issues, she denies any progression of memory.  She returns to this office for an evaluation  REVIEW OF SYSTEMS: Out of a complete 14 system review of symptoms, the patient complains only of the following symptoms, memory loss, agitation and all other reviewed systems are negative.   ALLERGIES: Allergies  Allergen Reactions   Aricept [Donepezil Hcl] Other (See Comments)    Low heart rate, do not take per cardiologist   Cefdinir  Diarrhea    Given for uti   Plaquenil [Hydroxychloroquine Sulfate]    Hydroxychloroquine Rash and Other (See Comments)     HOME MEDICATIONS: Outpatient Medications Prior to  Visit  Medication Sig Dispense Refill   aspirin 81 MG tablet Take 81 mg by mouth daily.     brimonidine (ALPHAGAN) 0.15 % ophthalmic solution 1 drop 2 (two) times daily.     citalopram  (CELEXA ) 20 MG tablet Take 1 tablet (20 mg total) by mouth daily. 90 tablet 1   hydrOXYzine  (ATARAX ) 25 MG tablet Take 1 tablet (25 mg total) by mouth every 12 (twelve) hours as needed for anxiety. 30 tablet 0   levETIRAcetam  (KEPPRA ) 250 MG tablet Take 1 tablet (250 mg total) by mouth 2 (two) times daily. 180 tablet 4   losartan  (COZAAR ) 50 MG tablet TAKE 1 TABLET BY MOUTH ONCE DAILY 90 tablet 3   Multiple Vitamins-Calcium  (ONE-A-DAY WOMENS PO) Take by mouth daily.     pravastatin  (PRAVACHOL ) 20 MG tablet TAKE 1 TABLET BY MOUTH AT BEDTIME 90 tablet 3   XELPROS 0.005 % EMUL Apply 1 drop to eye daily.     memantine  (NAMENDA ) 10 MG tablet Take 1 tablet (10 mg total) by mouth 2 (two) times daily. 180 tablet 3   No facility-administered medications prior to visit.     PAST MEDICAL HISTORY: Past Medical History:  Diagnosis Date   Abnormal EEG 01/14/2012   started on Keppra    Cataract    Depression 6 yrs ago   Dermatomyositis (HCC)     remission on pred until 2008, rheum at [facility]   GERD (gastroesophageal reflux disease)    agravated with Fosamax   Glaucoma    Hypertension 2013   Memory loss    PMB (postmenopausal bleeding) 08/04/2003   Seizures (HCC)    Status post dilation of esophageal narrowing    Urinary incontinence, mixed 07/01/2011   Fitted for 2.75  Ring Pessary with support   Vitamin D  deficiency disease      PAST SURGICAL HISTORY: Past Surgical History:  Procedure Laterality Date   COLONOSCOPY  09/25/2004   Dr. Kristie   COLONOSCOPY  02/03/2009   ENDOMETRIAL BIOPSY  08/24/2003   weak proliferative endo, [facility] neg.   ESOPHAGOGASTRODUODENOSCOPY ENDOSCOPY  04/29/2004   anemia without cause   EYE SURGERY     NO PAST SURGERIES       FAMILY HISTORY: Family History  Problem Relation Age of Onset   Dementia Father        died age 40, otherwise healthy   Hypertension Mother    Heart disease Maternal Grandfather    Heart disease Maternal Grandmother    Seizures Neg Hx      SOCIAL HISTORY: Social History   Socioeconomic History   Marital status: Married    Spouse name: Lynwood   Number of children: 1   Years of education: hs   Highest education level: Associate degree: occupational, scientist, product/process development, or vocational program  Occupational History   Occupation: housewife  Tobacco Use   Smoking status: Former    Current packs/day: 0.00    Average packs/day: 1 pack/day for 15.0 years (15.0 ttl pk-yrs)    Types: Cigarettes    Start date: 02/03/1961    Quit date: 02/04/1976    Years since quitting: 47.8   Smokeless tobacco: Never  Vaping Use   Vaping status: Never Used  Substance and Sexual Activity   Alcohol use: Not Currently    Alcohol/week: 7.0 standard drinks of alcohol    Types: 7 Glasses of wine per week    Comment: not actively   Drug use: No   Sexual activity: Never  Partners: Male    Birth control/protection: Post-menopausal  Other Topics Concern   Not on file  Social  History Narrative   Married, lives with spouse Sydnee).   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   Patient has one child.   Patient is right-handed.   Patient drinks 2-3 cups of coffee daily and maybe half cup at night.               Social Drivers of Corporate Investment Banker Strain: Low Risk  (12/02/2023)   Overall Financial Resource Strain (CARDIA)    Difficulty of Paying Living Expenses: Not hard at all  Food Insecurity: No Food Insecurity (12/02/2023)   Hunger Vital Sign    Worried About Running Out of Food in the Last Year: Never true    Ran Out of Food in the Last Year: Never true  Transportation Needs: No Transportation Needs (12/02/2023)   PRAPARE - Administrator, Civil Service (Medical): No    Lack of Transportation (Non-Medical): No  Physical Activity: Inactive (12/02/2023)   Exercise Vital Sign    Days of Exercise per Week: 0 days    Minutes of Exercise per Session: Not on file  Stress: Stress Concern Present (12/02/2023)   [employer] of Occupational Health - Occupational Stress Questionnaire    Feeling of Stress: Rather much  Social Connections: Moderately Integrated (12/02/2023)   Social Connection and Isolation Panel    Frequency of Communication with Friends and Family: Three times a week    Frequency of Social Gatherings with Friends and Family: Once a week    Attends Religious Services: 1 to 4 times per year    Active Member of [employer] or Organizations: No    Attends Engineer, Structural: Not on file    Marital Status: Married  Recent Concern: Social Connections - Moderately Isolated (10/30/2023)   Social Connection and Isolation Panel    Frequency of Communication with Friends and Family: More than three times a week    Frequency of Social Gatherings with Friends and Family: Once a week    Attends Religious Services: Never    Database Administrator or  Organizations: No    Attends Banker Meetings: Never    Marital Status: Married  Catering Manager Violence: Not At Risk (10/30/2023)   Humiliation, Afraid, Rape, and Kick questionnaire    Fear of Current or Ex-Partner: No    Emotionally Abused: No    Physically Abused: No    Sexually Abused: No      PHYSICAL EXAM  Vitals:   12/08/23 0958  BP: 112/78  Pulse: 88  SpO2: 98%  Weight: 104 lb 8 oz (47.4 kg)  Height: 4' 11 (1.499 m)       Body mass index is 21.11 kg/m.   Generalized: Well developed, in no acute distress  Cardiology: normal rate and rhythm, no murmur auscultated  Respiratory: clear to auscultation bilaterally    Neurological examination  Mentation: Alert , not oriented to time, oriented to city and state, oriented to some history taking. Follows all commands speech and language fluent Cranial nerve II-XII: Pupils were equal round reactive to light. Extraocular movements were full, visual field were full on confrontational test. Facial sensation and strength were normal. Head turning and shoulder shrug  were normal and symmetric. Motor: The motor testing reveals 5 over 5 strength of all 4 extremities. Good symmetric  motor tone is noted throughout.  Sensory: Sensory testing is intact to soft touch on all 4 extremities. No evidence of extinction is noted.  Coordination: Cerebellar testing reveals good finger-nose-finger and heel-to-shin bilaterally.  Gait and station: Gait is short and wide with external rotation noted of bilateral feet, reduced arm swing, tandem not attempted. Gait is stable without assistive device but she does hold on to Mr Smiths arm for stability.  Reflexes: Deep tendon reflexes are symmetric and normal bilaterally.     DIAGNOSTIC DATA (LABS, IMAGING, TESTING) - I reviewed patient records, labs, notes, testing and imaging myself where available.  Lab Results  Component Value Date   WBC 7.8 09/14/2023   HGB 12.0 09/14/2023    HCT 36.5 09/14/2023   MCV 86.9 09/14/2023   PLT 303.0 09/14/2023      Component Value Date/Time   NA 138 09/14/2023 1328   NA 140 09/08/2017 1437   K 4.0 09/14/2023 1328   CL 105 09/14/2023 1328   CO2 27 09/14/2023 1328   GLUCOSE 99 09/14/2023 1328   BUN 23 09/14/2023 1328   BUN 19 09/08/2017 1437   CREATININE 0.56 09/14/2023 1328   CALCIUM  9.2 09/14/2023 1328   PROT 7.5 09/14/2023 1328   PROT 7.5 02/07/2016 1256   ALBUMIN 4.2 09/14/2023 1328   ALBUMIN 4.5 02/07/2016 1256   AST 25 09/14/2023 1328   ALT 24 09/14/2023 1328   ALKPHOS 66 09/14/2023 1328   BILITOT 0.5 09/14/2023 1328   BILITOT 0.5 02/07/2016 1256   GFRNONAA 91 09/08/2017 1437   GFRAA 104 09/08/2017 1437   Lab Results  Component Value Date   CHOL 140 08/29/2022   HDL 52.00 08/29/2022   LDLCALC 73 08/29/2022   LDLDIRECT 129.0 02/09/2017   TRIG 76.0 08/29/2022   CHOLHDL 3 08/29/2022   Lab Results  Component Value Date   HGBA1C 5.3 03/03/2023   Lab Results  Component Value Date   VITAMINB12 983 02/07/2016   Lab Results  Component Value Date   TSH 1.73 09/14/2023      12/31/2021    2:00 PM 08/29/2021    2:18 PM 11/23/2018    2:32 PM  MMSE - Mini Mental State Exam  Not completed: Unable to complete Unable to complete   Orientation to time   2  Orientation to Place   3  Registration   3  Attention/ Calculation   2  Recall   1  Language- name 2 objects   2  Language- repeat   1  Language- follow 3 step command   3  Language- read & follow direction   1  Write a sentence   1  Copy design   1  Copy design-comments   named 5 animals  Total score   20     ASSESSMENT AND PLAN  78 y.o. year old female  has a past medical history of Abnormal EEG (01/14/2012), Cataract, Depression (6 yrs ago), Dermatomyositis (HCC), GERD (gastroesophageal reflux disease), Glaucoma, Hypertension (2013), Memory loss, PMB (postmenopausal bleeding) (08/04/2003), Seizures (HCC), Status post dilation of esophageal  narrowing, Urinary incontinence, mixed (07/01/2011), and Vitamin D  deficiency disease. here with   Seizure disorder (HCC)  Moderate dementia with anxiety, unspecified dementia type (HCC) - Plan: Ambulatory referral to [facility] is doing well. We will continue memantine  10mg  and levetiracetam  250mg  BID. Seizure precautions reviewed. MMSE deferred as it will not change treatment plan and makes her anxious. We will start Rexulti for worsening agitation in  dementia. Possible side effects and appropriate dosing discussed. He will continue hydroxyzine  25mg  at bedtime and may given additional 25mg  during the day as needed for anxiety/agitation. I am happy to send refill if needed. I will place home health order for evaluation and assistance with ADLs. Healthy lifestyle habits encouraged. Memory compensation strategies reviewed. She will continue close follow up with PCP. Return to see me in 6 months.    I spent 45 minutes of face-to-face and non-face-to-face time with patient.  This included previsit chart review, lab review, study review, order entry, electronic health record documentation, patient education.  Orders Placed This Encounter  Procedures   Ambulatory referral to Home Health    Referral Priority:   Routine    Referral Type:   Home Health Care    Referral Reason:   Specialty Services Required    Requested Specialty:   Home Health Services    Number of Visits Requested:   1     Meds ordered this encounter  Medications   brexpiprazole (REXULTI) 1 MG TABS tablet    Sig: Take 0.5 tablets (0.5 mg total) by mouth daily for 7 days, THEN 1 tablet (1 mg total) daily for 7 days, THEN 2 tablets (2 mg total) daily for 14 days.    Dispense:  42 tablet    Refill:  0    Supervising Provider:   YAN, YIJUN [[id number]]   memantine  (NAMENDA ) 10 MG tablet    Sig: Take 1 tablet (10 mg total) by mouth 2 (two) times daily.    Dispense:  180 tablet    Refill:  3    Supervising Provider:   YAN, YIJUN  [[id number]]     Delorice Bannister, MSN, FNP-C 12/08/2023, 11:06 AM  [facility] [address] [place], [place] [address] [phone]

## 2023-12-08 NOTE — Patient Instructions (Addendum)
 Below is our plan:  We will continue memantine  10mg  and levetiracetam  250mg  twice daily.   We will try Rexulti. Start 1/2 tablet (0.5 mg) once daily for 7 days, increase dose on days 8 to 14 to 1 tablet (1 mg) once daily, then on day 15 to the target dose of 2 mg once daily.  Continue hydroxyzine  25mg  twice daily as needed for agitation/anxiety.   I will order Home Health evaluation.   Please make sure you are staying well hydrated. I recommend 50-60 ounces daily. Well balanced diet and regular exercise encouraged. Consistent sleep schedule with 6-8 hours recommended.   Please continue follow up with care team as directed.   Follow up with me in 6-8 months  You may receive a survey regarding today's visit. I encourage you to leave honest feed back as I do use this information to improve patient care. Thank you for seeing me today!   Management of Memory Problems   There are some general things you can do to help manage your memory problems.  Your memory may not in fact recover, but by using techniques and strategies you will be able to manage your memory difficulties better.   1)  Establish a routine. Try to establish and then stick to a regular routine.  By doing this, you will get used to what to expect and you will reduce the need to rely on your memory.  Also, try to do things at the same time of day, such as taking your medication or checking your calendar first thing in the morning. Think about think that you can do as a part of a regular routine and make a list.  Then enter them into a daily planner to remind you.  This will help you establish a routine.   2)  Organize your environment. Organize your environment so that it is uncluttered.  Decrease visual stimulation.  Place everyday items such as keys or cell phone in the same place every day (ie.  Basket next to front door) Use post it notes with a brief message to yourself (ie. Turn off light, lock the door) Use labels to  indicate where things go (ie. Which cupboards are for food, dishes, etc.) Keep a notepad and pen by the telephone to take messages   3)  Memory Aids A diary or journal/notebook/daily planner Making a list (shopping list, chore list, to do list that needs to be done) Using an alarm as a reminder (kitchen timer or cell phone alarm) Using cell phone to store information (Notes, Calendar, Reminders) Calendar/White board placed in a prominent position Post-it notes   In order for memory aids to be useful, you need to have good habits.  It's no good remembering to make a note in your journal if you don't remember to look in it.  Try setting aside a certain time of day to look in journal.   4)  Improving mood and managing fatigue. There may be other factors that contribute to memory difficulties.  Factors, such as anxiety, depression and tiredness can affect memory. Regular gentle exercise can help improve your mood and give you more energy. Exercise: there are short videos created by the [employer] on Health specially for older adults: https://bit.ly/2I30q97.  Mediterranean diet: which emphasizes fruits, vegetables, whole grains, legumes, fish, and other seafood; unsaturated fats such as olive oils; and low amounts of red meat, eggs, and sweets. A variation of this, called MIND [facility] Intervention for Neurodegenerative Delay) incorporates the  DASH (Dietary Approaches to Stop Hypertension) diet, which has been shown to lower high blood pressure, a risk factor for Alzheimer's disease. More information at: exitmarketing.de.  Aerobic exercise that improve heart health is also good for the mind.  [employer] on Aging have short videos for exercises that you can do at home: Blindworkshop.com.pt Simple relaxation techniques may help relieve symptoms of anxiety Try to get back to completing activities or  hobbies you enjoyed doing in the past. Learn to pace yourself through activities to decrease fatigue. Find out about some local support groups where you can share experiences with others. Try and achieve 7-8 hours of sleep at night.   Resources for Family/Caregiver  Online caregiver support groups can be found at westerntunes.it or call Alzheimer's Association's 24/7 hotline: (618)220-7446. [facility] Memory Counsel[facility] Program offers in-person, virtual support groups and individual counseling for both care partners and persons with memory loss. Call for more information at [phone].   Advanced care plan: there are two types of Power of Attorney: healthcare and durable. Healthcare POA is a designated person to make healthcare decisions on your behalf if you were too sick to make them yourself. This person can be selected and documented by your physician. Durable POA has to be set up with a lawyer who takes charge of your finances and estate if you were too sick or cognitively impaired to manage your finances accurately. You can find a local Elder Therapist, art here: newportranch.at.  Check out www.planyourlifespan.org, which will help you plan before a crisis and decide who will take care of life considerations in a circumstance where you may not be able to speak for yourself.   Helpful books (available on [employer] or your local bookstore):  By Dr. Katherene Gentry: Keeping Love Alive as Memories Fade: The 5 Love Languages and the Alzheimer's Journey Nov 04, 2014 The Dementia Care Partner's Workbook: A Guide for Understanding, Education, and [employer] - July 04, 2017.  Both available for less than $15.   Coping with behavior change in dementia: a family caregiver's guide by Landry Mirza & Mitzie Pizza A Caregiver's Guide to Dementia: Using Activities and Other Strategies to Prevent, Reduce and Manage Behavioral Symptoms by Leita SAILOR. [place] and Catherine Piersol.  Creating Moments of Joy for the Person  with Alzheimer's or Dementia 4th edition by Melanie Mings  Caregiver videos on common behaviors related to dementia: populationgame.pl  [place] Caregiver Portal: free to sign up, links to local resources: [email or web address]://[email or web address]-caregivers.com/login

## 2023-12-09 ENCOUNTER — Encounter: Payer: Self-pay | Admitting: Family Medicine

## 2023-12-09 ENCOUNTER — Telehealth: Payer: Self-pay | Admitting: Family Medicine

## 2023-12-09 ENCOUNTER — Ambulatory Visit: Payer: Self-pay | Admitting: Nurse Practitioner

## 2023-12-09 NOTE — Telephone Encounter (Signed)
 [facility] is going to take this patient.

## 2023-12-10 ENCOUNTER — Telehealth: Payer: Self-pay

## 2023-12-10 ENCOUNTER — Other Ambulatory Visit (HOSPITAL_COMMUNITY): Payer: Self-pay

## 2023-12-10 DIAGNOSIS — R32 Unspecified urinary incontinence: Secondary | ICD-10-CM | POA: Diagnosis not present

## 2023-12-10 DIAGNOSIS — G40909 Epilepsy, unspecified, not intractable, without status epilepticus: Secondary | ICD-10-CM | POA: Diagnosis not present

## 2023-12-10 DIAGNOSIS — D649 Anemia, unspecified: Secondary | ICD-10-CM | POA: Diagnosis not present

## 2023-12-10 DIAGNOSIS — I10 Essential (primary) hypertension: Secondary | ICD-10-CM | POA: Diagnosis not present

## 2023-12-10 DIAGNOSIS — Z7982 Long term (current) use of aspirin: Secondary | ICD-10-CM | POA: Diagnosis not present

## 2023-12-10 DIAGNOSIS — Z87891 Personal history of nicotine dependence: Secondary | ICD-10-CM | POA: Diagnosis not present

## 2023-12-10 DIAGNOSIS — F03B4 Unspecified dementia, moderate, with anxiety: Secondary | ICD-10-CM | POA: Diagnosis not present

## 2023-12-10 DIAGNOSIS — K219 Gastro-esophageal reflux disease without esophagitis: Secondary | ICD-10-CM | POA: Diagnosis not present

## 2023-12-10 DIAGNOSIS — Z556 Problems related to health literacy: Secondary | ICD-10-CM | POA: Diagnosis not present

## 2023-12-10 LAB — URINALYSIS W MICROSCOPIC + REFLEX CULTURE
Bacteria, UA: NONE SEEN /HPF
Bilirubin Urine: NEGATIVE
Glucose, UA: NEGATIVE
Hgb urine dipstick: NEGATIVE
Hyaline Cast: NONE SEEN /LPF
Ketones, ur: NEGATIVE
Nitrites, Initial: NEGATIVE
Protein, ur: NEGATIVE
Specific Gravity, Urine: 1.02 (ref 1.001–1.035)
Squamous Epithelial / HPF: NONE SEEN /HPF (ref <=5)
pH: 6 (ref 5.0–8.0)

## 2023-12-10 LAB — URINE CULTURE
MICRO NUMBER:: 17192812
Result:: NO GROWTH
SPECIMEN QUALITY:: ADEQUATE

## 2023-12-10 LAB — CULTURE INDICATED

## 2023-12-10 NOTE — Telephone Encounter (Signed)
 Terryi from [facility] has called re: brexpiprazole (REXULTI) 1 MG TABS tablet  it has been approved from 12/10/23-12/09/24 quantity 42 tablets for 28 days her phone# [phone] option 5

## 2023-12-10 NOTE — Telephone Encounter (Signed)
 Pharmacy Patient Advocate Encounter   Received notification from Patient Advice Request messages that prior authorization for Rexulti is required/requested.   Insurance verification completed.   The patient is insured through [facility].   Per test claim: PA required; PA submitted to above mentioned insurance via Latent Key/confirmation #/EOC [id number] Status is pending

## 2023-12-11 ENCOUNTER — Telehealth: Payer: Self-pay | Admitting: Family Medicine

## 2023-12-11 ENCOUNTER — Other Ambulatory Visit (HOSPITAL_COMMUNITY): Payer: Self-pay

## 2023-12-11 NOTE — Telephone Encounter (Signed)
 We can try her on Seroquel.

## 2023-12-11 NOTE — Telephone Encounter (Signed)
 Pharmacy Patient Advocate Encounter  Received notification from [facility] that Prior Authorization for REXULTI has been APPROVED from 12/10/2023 to 12/09/2024. Ran test claim, Copay is [id number]. This test claim was processed through [facility]- copay amounts may vary at other pharmacies due to pharmacy/plan contracts, or as the patient moves through the different stages of their insurance plan.   PA #/Case ID/Reference #: [id number]  High Copay-PT likely to benefit form the medicare prescription payment plan program. If PT would like ot go that route they will need to outreach their insurance plan for details.

## 2023-12-11 NOTE — Telephone Encounter (Signed)
 Powell, [phone] with secure vm) with [facility] admitted pt to Home health services she is requesting verbal orders foe 1 week 3 and every other week 6.  She would also like to add OT eval

## 2023-12-14 ENCOUNTER — Other Ambulatory Visit: Payer: Self-pay | Admitting: Family Medicine

## 2023-12-14 MED ORDER — QUETIAPINE FUMARATE 25 MG PO TABS: 12.5000 mg | ORAL_TABLET | Freq: Every day | ORAL | 1 refills | Status: DC

## 2023-12-14 NOTE — Telephone Encounter (Signed)
 Called and left detailed msg on secure line to approve verbal order

## 2023-12-15 ENCOUNTER — Telehealth: Payer: Self-pay

## 2023-12-15 ENCOUNTER — Other Ambulatory Visit (HOSPITAL_COMMUNITY): Payer: Self-pay

## 2023-12-15 DIAGNOSIS — F03B4 Unspecified dementia, moderate, with anxiety: Secondary | ICD-10-CM | POA: Diagnosis not present

## 2023-12-15 DIAGNOSIS — Z87891 Personal history of nicotine dependence: Secondary | ICD-10-CM | POA: Diagnosis not present

## 2023-12-15 DIAGNOSIS — D649 Anemia, unspecified: Secondary | ICD-10-CM | POA: Diagnosis not present

## 2023-12-15 DIAGNOSIS — G40909 Epilepsy, unspecified, not intractable, without status epilepticus: Secondary | ICD-10-CM | POA: Diagnosis not present

## 2023-12-15 DIAGNOSIS — K219 Gastro-esophageal reflux disease without esophagitis: Secondary | ICD-10-CM | POA: Diagnosis not present

## 2023-12-15 DIAGNOSIS — R32 Unspecified urinary incontinence: Secondary | ICD-10-CM | POA: Diagnosis not present

## 2023-12-15 DIAGNOSIS — I10 Essential (primary) hypertension: Secondary | ICD-10-CM | POA: Diagnosis not present

## 2023-12-15 DIAGNOSIS — Z7982 Long term (current) use of aspirin: Secondary | ICD-10-CM | POA: Diagnosis not present

## 2023-12-15 DIAGNOSIS — Z556 Problems related to health literacy: Secondary | ICD-10-CM | POA: Diagnosis not present

## 2023-12-15 NOTE — Telephone Encounter (Signed)
 Pharmacy Patient Advocate Encounter   Received notification from Fax that prior authorization for Quetiapine is required/requested.   Insurance verification completed.   The patient is insured through [facility].   Per test claim: PA required; PA submitted to above mentioned insurance via Latent Key/confirmation #/EOC [id number] Status is pending

## 2023-12-16 ENCOUNTER — Other Ambulatory Visit (HOSPITAL_COMMUNITY): Payer: Self-pay

## 2023-12-16 ENCOUNTER — Encounter: Payer: Self-pay | Admitting: Nurse Practitioner

## 2023-12-16 NOTE — Telephone Encounter (Signed)
 Pharmacy Patient Advocate Encounter  Received notification from [facility] that Prior Authorization for Quetiapine 25mg  Tablets has been APPROVED from 12/16/2023 to 12/14/2024. Ran test claim, Copay is $4.67. This test claim was processed through [facility]- copay amounts may vary at other pharmacies due to pharmacy/plan contracts, or as the patient moves through the different stages of their insurance plan.   PA #/Case ID/Reference #: [id number]

## 2023-12-16 NOTE — Telephone Encounter (Signed)
 Crystal from [employer] called to give the approval of the pt's Quetiapine from dates 12/15/23-12/14/24. Letter of approval will be faxed to the office. Any questions [phone] option 5 can be called.

## 2023-12-17 ENCOUNTER — Encounter: Payer: Self-pay | Admitting: Nurse Practitioner

## 2023-12-17 ENCOUNTER — Ambulatory Visit (INDEPENDENT_AMBULATORY_CARE_PROVIDER_SITE_OTHER): Admitting: Nurse Practitioner

## 2023-12-17 VITALS — BP 118/72 | HR 62 | Temp 97.9°F | Ht 60.0 in | Wt 106.8 lb

## 2023-12-17 DIAGNOSIS — N390 Urinary tract infection, site not specified: Secondary | ICD-10-CM | POA: Diagnosis not present

## 2023-12-17 DIAGNOSIS — Z8669 Personal history of other diseases of the nervous system and sense organs: Secondary | ICD-10-CM | POA: Insufficient documentation

## 2023-12-17 DIAGNOSIS — F03B4 Unspecified dementia, moderate, with anxiety: Secondary | ICD-10-CM

## 2023-12-17 DIAGNOSIS — I10 Essential (primary) hypertension: Secondary | ICD-10-CM

## 2023-12-17 NOTE — Progress Notes (Signed)
 Established Patient Visit  Patient: April Chung   DOB: 10-31-1945   78 y.o. Female  MRN: [id number] Visit Date: 12/17/2023  Subjective:    Chief Complaint  Patient presents with   Follow-up    1 month follow up    HPI  Accompanied by Husband Mr. Emile who provided majority of the history.  Moderate dementia with anxiety, unspecified dementia type [facility]) Mr. Marton reports altered mental status noted with UTI only (hallucination and agitation which is worse at night). Symptoms resolved after oral antibiotics. Current use of vistaril  25mg  at hs She had eval with neurology, rizuelti prescribed but unable to afford copay. Mr. Ocampo does not think seroquel is needed at this time.  Maintain vistaril  dose And f/up appointment with neurology  Recurrent UTI Treated for UTI 3times in last 3months. Denies any diarrhea or constipation or vaginal itching or odor or dysuria Hx of urinary incontinence UA with culture completed on 12/08/2023 was normal  [facility] is [facility] 01/06/2024 Encouraged to maintain adequate oral hydration Also discussed proper perineal hygiene.  Hypertension Current use of losartan  50mg  daily BP at goal BP Readings from Last 3 Encounters:  12/17/23 118/72  12/08/23 112/78  12/02/23 130/76    Maintain med dose Hold Losartan  if BP<120/70 F/up in 3months  Wt Readings from Last 3 Encounters:  12/17/23 106 lb 12.8 oz (48.4 kg)  12/08/23 104 lb 8 oz (47.4 kg)  12/02/23 106 lb (48.1 kg)    Reviewed medical, surgical, and social history today  Medications: Outpatient Medications Prior to Visit  Medication Sig   aspirin 81 MG tablet Take 81 mg by mouth daily.   brimonidine (ALPHAGAN) 0.15 % ophthalmic solution 1 drop 2 (two) times daily.   citalopram  (CELEXA ) 20 MG tablet Take 1 tablet (20 mg total) by mouth daily.   hydrOXYzine  (ATARAX ) 25 MG tablet Take 1 tablet (25 mg total) by mouth every 12 (twelve) hours as needed for  anxiety.   levETIRAcetam  (KEPPRA ) 250 MG tablet Take 1 tablet (250 mg total) by mouth 2 (two) times daily.   losartan  (COZAAR ) 50 MG tablet TAKE 1 TABLET BY MOUTH ONCE DAILY   memantine  (NAMENDA ) 10 MG tablet Take 1 tablet (10 mg total) by mouth 2 (two) times daily.   Multiple Vitamins-Calcium  (ONE-A-DAY WOMENS PO) Take by mouth daily.   pravastatin  (PRAVACHOL ) 20 MG tablet TAKE 1 TABLET BY MOUTH AT BEDTIME   QUEtiapine (SEROQUEL) 25 MG tablet Take 0.5 tablets (12.5 mg total) by mouth at bedtime.   XELPROS 0.005 % EMUL Apply 1 drop to eye daily.   No facility-administered medications prior to visit.   Reviewed past medical and social history.   ROS per HPI above  Last CBC Lab Results  Component Value Date   WBC 7.8 09/14/2023   HGB 12.0 09/14/2023   HCT 36.5 09/14/2023   MCV 86.9 09/14/2023   MCH 31.8 07/12/2016   RDW 13.5 09/14/2023   PLT 303.0 09/14/2023   Last metabolic panel Lab Results  Component Value Date   GLUCOSE 99 09/14/2023   NA 138 09/14/2023   K 4.0 09/14/2023   CL 105 09/14/2023   CO2 27 09/14/2023   BUN 23 09/14/2023   CREATININE 0.56 09/14/2023   GFR 86.13 09/14/2023   CALCIUM  9.2 09/14/2023   PROT 7.5 09/14/2023   ALBUMIN 4.2 09/14/2023   LABGLOB 3.0 02/07/2016   AGRATIO 1.5 02/07/2016  BILITOT 0.5 09/14/2023   ALKPHOS 66 09/14/2023   AST 25 09/14/2023   ALT 24 09/14/2023   ANIONGAP 8 07/12/2016   Last thyroid  functions Lab Results  Component Value Date   TSH 1.73 09/14/2023        Objective:  BP 118/72 (BP Location: Left Wrist, Patient Position: Sitting, Cuff Size: Normal)   Pulse 62   Temp 97.9 F (36.6 C) (Oral)   Ht 5' (1.524 m)   Wt 106 lb 12.8 oz (48.4 kg)   LMP 08/03/1993 (Approximate)   SpO2 97%   BMI 20.86 kg/m      Physical Exam Vitals and nursing note reviewed.  Cardiovascular:     Rate and Rhythm: Normal rate and regular rhythm.     Pulses: Normal pulses.     Heart sounds: Normal heart sounds.  Pulmonary:      Effort: Pulmonary effort is normal.     Breath sounds: Normal breath sounds.  Neurological:     Mental Status: She is alert and oriented to person, place, and time.     No results found for any visits on 12/17/23.    Assessment & Plan:    Problem List Items Addressed This Visit     Hypertension - Primary   Current use of losartan  50mg  daily BP at goal BP Readings from Last 3 Encounters:  12/17/23 118/72  12/08/23 112/78  12/02/23 130/76    Maintain med dose Hold Losartan  if BP<120/70 F/up in 3months      Moderate dementia with anxiety, unspecified dementia type [facility])   Mr. Romanek reports altered mental status noted with UTI only (hallucination and agitation which is worse at night). Symptoms resolved after oral antibiotics. Current use of vistaril  25mg  at hs She had eval with neurology, rizuelti prescribed but unable to afford copay. Mr. Awe does not think seroquel is needed at this time.  Maintain vistaril  dose And f/up appointment with neurology      Recurrent UTI   Treated for UTI 3times in last 3months. Denies any diarrhea or constipation or vaginal itching or odor or dysuria Hx of urinary incontinence UA with culture completed on 12/08/2023 was normal  [facility] is [facility] with [facility] 01/06/2024 Encouraged to maintain adequate oral hydration Also discussed proper perineal hygiene.      Return in about 3 months (around 03/18/2024) for depression and anxiety, hyperlipidemia (fasting).     Roselie Mood, NP

## 2023-12-17 NOTE — Assessment & Plan Note (Addendum)
 Treated for UTI 3times in last 3months. Denies any diarrhea or constipation or vaginal itching or odor or dysuria Hx of urinary incontinence UA with culture completed on 12/08/2023 was normal  [facility] is [facility] with [facility] 01/06/2024 Encouraged to maintain adequate oral hydration Also discussed proper perineal hygiene.

## 2023-12-17 NOTE — Assessment & Plan Note (Signed)
 Current use of losartan  50mg  daily BP at goal BP Readings from Last 3 Encounters:  12/17/23 118/72  12/08/23 112/78  12/02/23 130/76    Maintain med dose Hold Losartan  if BP<120/70 F/up in 3months

## 2023-12-17 NOTE — Patient Instructions (Addendum)
 Maintain upcoming appointment with urology Maintain current med doses Call office for urinalysis order if she develops any symptoms indicating possible UTI

## 2023-12-17 NOTE — Assessment & Plan Note (Addendum)
 Mr. Hable reports altered mental status noted with UTI only (hallucination and agitation which is worse at night). Symptoms resolved after oral antibiotics. Current use of vistaril  25mg  at hs She had eval with neurology, rizuelti prescribed but unable to afford copay. Mr. Tricarico does not think seroquel is needed at this time.  Maintain vistaril  dose And f/up appointment with neurology

## 2023-12-18 ENCOUNTER — Telehealth: Payer: Self-pay

## 2023-12-18 ENCOUNTER — Encounter (INDEPENDENT_AMBULATORY_CARE_PROVIDER_SITE_OTHER): Admitting: Nurse Practitioner

## 2023-12-18 ENCOUNTER — Ambulatory Visit: Payer: Self-pay | Admitting: Nurse Practitioner

## 2023-12-18 ENCOUNTER — Encounter: Payer: Self-pay | Admitting: Nurse Practitioner

## 2023-12-18 ENCOUNTER — Encounter: Payer: Self-pay | Admitting: Family Medicine

## 2023-12-18 DIAGNOSIS — I10 Essential (primary) hypertension: Secondary | ICD-10-CM | POA: Diagnosis not present

## 2023-12-18 DIAGNOSIS — K219 Gastro-esophageal reflux disease without esophagitis: Secondary | ICD-10-CM | POA: Diagnosis not present

## 2023-12-18 DIAGNOSIS — G40909 Epilepsy, unspecified, not intractable, without status epilepticus: Secondary | ICD-10-CM | POA: Diagnosis not present

## 2023-12-18 DIAGNOSIS — Z87891 Personal history of nicotine dependence: Secondary | ICD-10-CM | POA: Diagnosis not present

## 2023-12-18 DIAGNOSIS — R32 Unspecified urinary incontinence: Secondary | ICD-10-CM | POA: Diagnosis not present

## 2023-12-18 DIAGNOSIS — N390 Urinary tract infection, site not specified: Secondary | ICD-10-CM | POA: Diagnosis not present

## 2023-12-18 DIAGNOSIS — D649 Anemia, unspecified: Secondary | ICD-10-CM | POA: Diagnosis not present

## 2023-12-18 DIAGNOSIS — Z556 Problems related to health literacy: Secondary | ICD-10-CM | POA: Diagnosis not present

## 2023-12-18 DIAGNOSIS — Z7982 Long term (current) use of aspirin: Secondary | ICD-10-CM | POA: Diagnosis not present

## 2023-12-18 DIAGNOSIS — F03B4 Unspecified dementia, moderate, with anxiety: Secondary | ICD-10-CM | POA: Diagnosis not present

## 2023-12-18 LAB — POCT URINALYSIS DIPSTICK
Bilirubin, UA: NEGATIVE
Blood, UA: NEGATIVE
Glucose, UA: NEGATIVE
Ketones, UA: NEGATIVE
Leukocytes, UA: NEGATIVE
Nitrite, UA: NEGATIVE
Protein, UA: NEGATIVE
Spec Grav, UA: 1.01
Urobilinogen, UA: NEGATIVE U/dL — AB
pH, UA: 6.5

## 2023-12-18 NOTE — Telephone Encounter (Signed)
 Copied from CRM [id number]. Topic: Clinical - Medical Advice >> Dec 18, 2023  8:34 AM Victoria A wrote: Reason for CRM: Patient's spouse called and said that he dropped specimen off at lab and lab told him to call and speak with [facility] nurse-please contact Signe Sharps [phone] [facility])

## 2023-12-18 NOTE — Addendum Note (Signed)
 Addended by: KATHEEN GARDEN L on: 12/18/2023 11:58 AM   Modules accepted: Orders

## 2023-12-19 LAB — URINE CULTURE
MICRO NUMBER:: 17237220
Result:: NO GROWTH
SPECIMEN QUALITY:: ADEQUATE

## 2023-12-21 DIAGNOSIS — Z87891 Personal history of nicotine dependence: Secondary | ICD-10-CM | POA: Diagnosis not present

## 2023-12-21 DIAGNOSIS — D649 Anemia, unspecified: Secondary | ICD-10-CM | POA: Diagnosis not present

## 2023-12-21 DIAGNOSIS — F03B4 Unspecified dementia, moderate, with anxiety: Secondary | ICD-10-CM | POA: Diagnosis not present

## 2023-12-21 DIAGNOSIS — I10 Essential (primary) hypertension: Secondary | ICD-10-CM | POA: Diagnosis not present

## 2023-12-21 DIAGNOSIS — Z7982 Long term (current) use of aspirin: Secondary | ICD-10-CM | POA: Diagnosis not present

## 2023-12-21 DIAGNOSIS — Z556 Problems related to health literacy: Secondary | ICD-10-CM | POA: Diagnosis not present

## 2023-12-21 DIAGNOSIS — G40909 Epilepsy, unspecified, not intractable, without status epilepticus: Secondary | ICD-10-CM | POA: Diagnosis not present

## 2023-12-21 DIAGNOSIS — R32 Unspecified urinary incontinence: Secondary | ICD-10-CM | POA: Diagnosis not present

## 2023-12-21 DIAGNOSIS — K219 Gastro-esophageal reflux disease without esophagitis: Secondary | ICD-10-CM | POA: Diagnosis not present

## 2023-12-21 NOTE — Progress Notes (Signed)
 Last read by Harland CHRISTELLA Sharps at 8:15AM on 12/21/2023.

## 2023-12-21 NOTE — Telephone Encounter (Signed)
 Called patient and her spouse Mr. Caylee Vlachos who is on the [facility] on file answered that telephone. The patient's spouse has been notified of the result and verbalized understanding.  All questions (if any) were answered. Spouse wanted to make [facility] aware that the Seroquel medication has been working for now.

## 2023-12-22 ENCOUNTER — Telehealth: Payer: Self-pay | Admitting: Family Medicine

## 2023-12-22 NOTE — Telephone Encounter (Signed)
 Amy- ok to call and provide requested orders?

## 2023-12-22 NOTE — Telephone Encounter (Signed)
 Called [phone]. This was to [employer].   Called [facility] at [phone]. Spoke w/ Olam. Provided VO for OT 1 time a week for 5 weeks starting week of 01/04/24.

## 2023-12-22 NOTE — Telephone Encounter (Signed)
 April Chung from [facility] called needing VO for OT with frequency of 1x 5w starting on the week of Dec. 1st Please advise.

## 2023-12-23 DIAGNOSIS — R32 Unspecified urinary incontinence: Secondary | ICD-10-CM | POA: Diagnosis not present

## 2023-12-23 DIAGNOSIS — G40909 Epilepsy, unspecified, not intractable, without status epilepticus: Secondary | ICD-10-CM | POA: Diagnosis not present

## 2023-12-23 DIAGNOSIS — Z7982 Long term (current) use of aspirin: Secondary | ICD-10-CM | POA: Diagnosis not present

## 2023-12-23 DIAGNOSIS — D649 Anemia, unspecified: Secondary | ICD-10-CM | POA: Diagnosis not present

## 2023-12-23 DIAGNOSIS — K219 Gastro-esophageal reflux disease without esophagitis: Secondary | ICD-10-CM | POA: Diagnosis not present

## 2023-12-23 DIAGNOSIS — Z556 Problems related to health literacy: Secondary | ICD-10-CM | POA: Diagnosis not present

## 2023-12-23 DIAGNOSIS — F03B4 Unspecified dementia, moderate, with anxiety: Secondary | ICD-10-CM | POA: Diagnosis not present

## 2023-12-23 DIAGNOSIS — Z87891 Personal history of nicotine dependence: Secondary | ICD-10-CM | POA: Diagnosis not present

## 2023-12-23 DIAGNOSIS — I10 Essential (primary) hypertension: Secondary | ICD-10-CM | POA: Diagnosis not present

## 2023-12-24 ENCOUNTER — Encounter: Payer: Self-pay | Admitting: Family Medicine

## 2023-12-24 DIAGNOSIS — Z556 Problems related to health literacy: Secondary | ICD-10-CM | POA: Diagnosis not present

## 2023-12-24 DIAGNOSIS — G40909 Epilepsy, unspecified, not intractable, without status epilepticus: Secondary | ICD-10-CM | POA: Diagnosis not present

## 2023-12-24 DIAGNOSIS — K219 Gastro-esophageal reflux disease without esophagitis: Secondary | ICD-10-CM | POA: Diagnosis not present

## 2023-12-24 DIAGNOSIS — F03B4 Unspecified dementia, moderate, with anxiety: Secondary | ICD-10-CM | POA: Diagnosis not present

## 2023-12-24 DIAGNOSIS — R32 Unspecified urinary incontinence: Secondary | ICD-10-CM | POA: Diagnosis not present

## 2023-12-24 DIAGNOSIS — D649 Anemia, unspecified: Secondary | ICD-10-CM | POA: Diagnosis not present

## 2023-12-24 DIAGNOSIS — Z87891 Personal history of nicotine dependence: Secondary | ICD-10-CM | POA: Diagnosis not present

## 2023-12-24 DIAGNOSIS — I10 Essential (primary) hypertension: Secondary | ICD-10-CM | POA: Diagnosis not present

## 2023-12-24 DIAGNOSIS — Z7982 Long term (current) use of aspirin: Secondary | ICD-10-CM | POA: Diagnosis not present

## 2023-12-28 ENCOUNTER — Ambulatory Visit: Payer: Medicare Other | Admitting: Family Medicine

## 2023-12-30 DIAGNOSIS — F03B4 Unspecified dementia, moderate, with anxiety: Secondary | ICD-10-CM | POA: Diagnosis not present

## 2023-12-30 DIAGNOSIS — R32 Unspecified urinary incontinence: Secondary | ICD-10-CM | POA: Diagnosis not present

## 2023-12-30 DIAGNOSIS — G40909 Epilepsy, unspecified, not intractable, without status epilepticus: Secondary | ICD-10-CM | POA: Diagnosis not present

## 2023-12-30 DIAGNOSIS — Z7982 Long term (current) use of aspirin: Secondary | ICD-10-CM | POA: Diagnosis not present

## 2023-12-30 DIAGNOSIS — Z556 Problems related to health literacy: Secondary | ICD-10-CM | POA: Diagnosis not present

## 2023-12-30 DIAGNOSIS — I10 Essential (primary) hypertension: Secondary | ICD-10-CM | POA: Diagnosis not present

## 2023-12-30 DIAGNOSIS — D649 Anemia, unspecified: Secondary | ICD-10-CM | POA: Diagnosis not present

## 2023-12-30 DIAGNOSIS — Z87891 Personal history of nicotine dependence: Secondary | ICD-10-CM | POA: Diagnosis not present

## 2023-12-30 DIAGNOSIS — K219 Gastro-esophageal reflux disease without esophagitis: Secondary | ICD-10-CM | POA: Diagnosis not present

## 2024-01-04 DIAGNOSIS — Z556 Problems related to health literacy: Secondary | ICD-10-CM | POA: Diagnosis not present

## 2024-01-04 DIAGNOSIS — R32 Unspecified urinary incontinence: Secondary | ICD-10-CM | POA: Diagnosis not present

## 2024-01-04 DIAGNOSIS — G40909 Epilepsy, unspecified, not intractable, without status epilepticus: Secondary | ICD-10-CM | POA: Diagnosis not present

## 2024-01-04 DIAGNOSIS — K219 Gastro-esophageal reflux disease without esophagitis: Secondary | ICD-10-CM | POA: Diagnosis not present

## 2024-01-04 DIAGNOSIS — D649 Anemia, unspecified: Secondary | ICD-10-CM | POA: Diagnosis not present

## 2024-01-04 DIAGNOSIS — I10 Essential (primary) hypertension: Secondary | ICD-10-CM | POA: Diagnosis not present

## 2024-01-04 DIAGNOSIS — Z87891 Personal history of nicotine dependence: Secondary | ICD-10-CM | POA: Diagnosis not present

## 2024-01-04 DIAGNOSIS — Z7982 Long term (current) use of aspirin: Secondary | ICD-10-CM | POA: Diagnosis not present

## 2024-01-04 DIAGNOSIS — F03B4 Unspecified dementia, moderate, with anxiety: Secondary | ICD-10-CM | POA: Diagnosis not present

## 2024-01-05 DIAGNOSIS — F03B4 Unspecified dementia, moderate, with anxiety: Secondary | ICD-10-CM | POA: Diagnosis not present

## 2024-01-05 DIAGNOSIS — R32 Unspecified urinary incontinence: Secondary | ICD-10-CM | POA: Diagnosis not present

## 2024-01-05 DIAGNOSIS — G40909 Epilepsy, unspecified, not intractable, without status epilepticus: Secondary | ICD-10-CM | POA: Diagnosis not present

## 2024-01-05 DIAGNOSIS — D649 Anemia, unspecified: Secondary | ICD-10-CM | POA: Diagnosis not present

## 2024-01-05 DIAGNOSIS — K219 Gastro-esophageal reflux disease without esophagitis: Secondary | ICD-10-CM | POA: Diagnosis not present

## 2024-01-05 DIAGNOSIS — Z7982 Long term (current) use of aspirin: Secondary | ICD-10-CM | POA: Diagnosis not present

## 2024-01-05 DIAGNOSIS — I10 Essential (primary) hypertension: Secondary | ICD-10-CM | POA: Diagnosis not present

## 2024-01-05 DIAGNOSIS — Z87891 Personal history of nicotine dependence: Secondary | ICD-10-CM | POA: Diagnosis not present

## 2024-01-05 DIAGNOSIS — Z556 Problems related to health literacy: Secondary | ICD-10-CM | POA: Diagnosis not present

## 2024-01-06 ENCOUNTER — Ambulatory Visit: Admitting: Urology

## 2024-01-06 VITALS — BP 119/67 | HR 63 | Ht 60.0 in | Wt 107.0 lb

## 2024-01-06 DIAGNOSIS — Z09 Encounter for follow-up examination after completed treatment for conditions other than malignant neoplasm: Secondary | ICD-10-CM | POA: Diagnosis not present

## 2024-01-06 DIAGNOSIS — Z87440 Personal history of urinary (tract) infections: Secondary | ICD-10-CM | POA: Diagnosis not present

## 2024-01-06 DIAGNOSIS — N952 Postmenopausal atrophic vaginitis: Secondary | ICD-10-CM

## 2024-01-06 DIAGNOSIS — N390 Urinary tract infection, site not specified: Secondary | ICD-10-CM

## 2024-01-06 LAB — BLADDER SCAN AMB NON-IMAGING: Scan Result: 82

## 2024-01-06 MED ORDER — ESTRADIOL 0.01 % VA CREA: TOPICAL_CREAM | VAGINAL | 3 refills | Status: AC

## 2024-01-06 NOTE — Progress Notes (Signed)
 Chief Complaint:  Chief Complaint  Patient presents with   Recurrent UTI    History of Present Illness:  This 78 year old female comes in today with her husband.  She has moderate cognitive dysfunction.  She has been referred for evaluation and management of her current urinary tract infections.  The patient was having episodes of waking up at midnight, getting dressed and then staying up all night.  This happened on several occasions.  At that time she was found to have urine that had Klebsiella/E. coli positive cultures.  She was placed on antibiotics.  She was having no other urinary symptomatology.  She did have behavioral issues a couple of times with negative cultures.  She was placed on Seroquel , 2 to 3 weeks ago.  Since then she has been sleeping much better through the night.  Past Medical History:  Past Medical History:  Diagnosis Date   Abnormal EEG 01/14/2012   started on Keppra    Cataract    Depression 6 yrs ago   Dermatomyositis (HCC)    remission on pred until 2008, rheum at [facility]   GERD (gastroesophageal reflux disease)    agravated with Fosamax   Glaucoma    Hypertension 2013   Memory loss    PMB (postmenopausal bleeding) 08/04/2003   Seizures (HCC)    Status post dilation of esophageal narrowing    Urinary incontinence, mixed 07/01/2011   Fitted for 2.75  Ring Pessary with support   Vitamin D  deficiency disease     Past Surgical History:  Past Surgical History:  Procedure Laterality Date   COLONOSCOPY  09/25/2004   Dr. Kristie   COLONOSCOPY  02/03/2009   ENDOMETRIAL BIOPSY  08/24/2003   weak proliferative endo, SH[facility] neg.   ESOPHAGOGASTRODUODENOSCOPY ENDOSCOPY  04/29/2004   anemia without cause   EYE SURGERY     NO PAST SURGERIES      Allergies:  Allergies  Allergen Reactions   Aricept [Donepezil Hcl] Other (See Comments)    Low heart rate, do not take per cardiologist   Cefdinir  Diarrhea    Given for uti   Plaquenil  [Hydroxychloroquine Sulfate]    Hydroxychloroquine Rash and Other (See Comments)    Family History:  Family History  Problem Relation Age of Onset   Dementia Father        died age 60, otherwise healthy   Hypertension Mother    Heart disease Maternal Grandfather    Heart disease Maternal Grandmother    Seizures Neg Hx     Social History:  Social History   Tobacco Use   Smoking status: Former    Current packs/day: 0.00    Average packs/day: 1 pack/day for 15.0 years (15.0 ttl pk-yrs)    Types: Cigarettes    Start date: 02/03/1961    Quit date: 02/04/1976    Years since quitting: 47.9   Smokeless tobacco: Never  Vaping Use   Vaping status: Never Used  Substance Use Topics   Alcohol use: Not Currently    Alcohol/week: 7.0 standard drinks of alcohol    Types: 7 Glasses of wine per week    Comment: not actively   Drug use: No    Review of symptoms:  Constitutional:  Negative for unexplained weight loss, night sweats, fever, chills ENT:  Negative for nose bleeds, sinus pain, painful swallowing CV:  Negative for chest pain, shortness of breath, exercise intolerance, palpitations, loss of consciousness Resp:  Negative for cough, wheezing, shortness of breath GI:  Negative for nausea, vomiting, diarrhea, bloody stools GU:  Positives noted in HPI; otherwise negative for gross hematuria, dysuria, urinary incontinence Neuro:  Negative for seizures, poor balance, limb weakness, slurred speech Psych:  Negative for lack of energy, depression, anxiety Endocrine:  Negative for polydipsia, polyuria, symptoms of hypoglycemia (dizziness, hunger, sweating) Hematologic:  Negative for anemia, purpura, petechia, prolonged or excessive bleeding, use of anticoagulants  Allergic:  Negative for difficulty breathing or choking as a result of exposure to anything; no shellfish allergy; no allergic response (rash/itch) to materials, foods  Physical exam: BP 119/67   Pulse 63   Ht 5' (1.524 m)   Wt  107 lb (48.5 kg)   LMP 08/03/1993 (Approximate)   BMI 20.90 kg/m  GENERAL APPEARANCE:  Well appearing, well developed, well nourished, NAD HEENT: Atraumatic, Normocephalic. NECK: Normal appearance LUNGS: Normal inspiratory and expiratory excursion HEART: Regular Rate EXTREMITIES: Moves all extremities well.  Without clubbing, cyanosis, or edema. NEUROLOGIC:  Alert and oriented x 3, normal gait, CN II-XII grossly intact.  MENTAL STATUS:  Appropriate for cognitive dysfunction SKIN:  Warm, dry and intact.    Results: Results for orders placed or performed in visit on 01/06/24 (from the past 24 hours)  BLADDER SCAN AMB NON-IMAGING   Collection Time: 01/06/24  1:38 PM  Result Value Ref Range   Scan Result 82    Residual urine volume 80 mL.  The patient could not void today.  I have reviewed referring/prior physicians records  I have reviewed prior urine cultures  I reviewed prior imaging studies  Prior urinalyses reviewed   Residual urine volume 80 mL today.  Assessment: History of UTIs.  It could be that the patient's neurologic symptoms are from her baseline cognitive issues.  She certainly has had improvement of her behavioral change with the Seroquel   Possible colonization of her bladder, quite common in an 78 year old female, not necessarily causing her neurologic symptoms  Plan: 1.  I did reassure her husband that her urine is clear today and there does not appear to be pyuria  2.  I also educated the patient's husband that oftentimes there is colonization in older women without symptoms  3.  I do not think she needs any other evaluation at the present time.  I do think it would be worthwhile starting perivaginal estrogen cream, however.  We will get that sent in.  She will use it 2-3 nights a week  4.  Office visit as needed

## 2024-01-07 DIAGNOSIS — F03B4 Unspecified dementia, moderate, with anxiety: Secondary | ICD-10-CM | POA: Diagnosis not present

## 2024-01-07 DIAGNOSIS — G40909 Epilepsy, unspecified, not intractable, without status epilepticus: Secondary | ICD-10-CM | POA: Diagnosis not present

## 2024-01-07 DIAGNOSIS — K219 Gastro-esophageal reflux disease without esophagitis: Secondary | ICD-10-CM | POA: Diagnosis not present

## 2024-01-07 DIAGNOSIS — Z556 Problems related to health literacy: Secondary | ICD-10-CM | POA: Diagnosis not present

## 2024-01-07 DIAGNOSIS — Z7982 Long term (current) use of aspirin: Secondary | ICD-10-CM | POA: Diagnosis not present

## 2024-01-07 DIAGNOSIS — I10 Essential (primary) hypertension: Secondary | ICD-10-CM | POA: Diagnosis not present

## 2024-01-07 DIAGNOSIS — D649 Anemia, unspecified: Secondary | ICD-10-CM | POA: Diagnosis not present

## 2024-01-07 DIAGNOSIS — Z87891 Personal history of nicotine dependence: Secondary | ICD-10-CM | POA: Diagnosis not present

## 2024-01-07 DIAGNOSIS — R32 Unspecified urinary incontinence: Secondary | ICD-10-CM | POA: Diagnosis not present

## 2024-01-10 ENCOUNTER — Encounter: Payer: Self-pay | Admitting: Family Medicine

## 2024-01-12 MED ORDER — QUETIAPINE FUMARATE 25 MG PO TABS: 25.0000 mg | ORAL_TABLET | Freq: Every day | ORAL | 1 refills | Status: AC

## 2024-01-15 ENCOUNTER — Telehealth: Payer: Self-pay

## 2024-01-15 NOTE — Telephone Encounter (Signed)
 Copied from CRM #[id number]. Topic: Clinical - Home Health Verbal Orders >> Jan 15, 2024  4:50 PM [name] wrote: April Chung with [facility] wants to report Pt fell on Monday per husband. Pt was fine no injuries reported.

## 2024-01-18 NOTE — Telephone Encounter (Signed)
 Called patient and her spouse Maxima Skelton answered who is on current DPR on file. Asked how Mrs. April Chung was doing and if there were any issues from her recent fall. Mr. Tyson stated that everything was fime that he was sitting near when the home nurse called our office on Friday. He also stated that she fell to one knee that the fall was not to major. He thanked me for calling to check in and was informed if there are any changes to please give the office a call or call 911 go to nearest ED. He verbalized understanding and all (if any) questions were answered.

## 2024-02-27 ENCOUNTER — Other Ambulatory Visit: Payer: Self-pay | Admitting: Nurse Practitioner

## 2024-02-27 DIAGNOSIS — F3341 Major depressive disorder, recurrent, in partial remission: Secondary | ICD-10-CM

## 2024-02-29 NOTE — Telephone Encounter (Signed)
 Refill request received for Citalopram  20mg  FOV: 04/19/24 LOV:12/17/23 Last refill: 09/14/23 Medication is pending your approval.

## 2024-03-25 ENCOUNTER — Ambulatory Visit: Admitting: Nurse Practitioner

## 2024-03-28 ENCOUNTER — Ambulatory Visit: Admitting: Nurse Practitioner

## 2024-04-19 ENCOUNTER — Ambulatory Visit: Admitting: Nurse Practitioner

## 2024-07-18 ENCOUNTER — Ambulatory Visit: Admitting: Neurology

## 2024-08-03 ENCOUNTER — Ambulatory Visit: Admitting: Neurology

## 2024-11-04 ENCOUNTER — Ambulatory Visit
